# Patient Record
Sex: Male | Born: 1935 | Race: Black or African American | Hispanic: No | Marital: Single | Smoking: Never smoker
Health system: Southern US, Community
[De-identification: ages and names within clinical notes are randomized; demographics above are authoritative.]

## PROBLEM LIST (undated history)

## (undated) DIAGNOSIS — E119 Type 2 diabetes mellitus without complications: Secondary | ICD-10-CM

## (undated) DIAGNOSIS — E785 Hyperlipidemia, unspecified: Secondary | ICD-10-CM

## (undated) DIAGNOSIS — I639 Cerebral infarction, unspecified: Secondary | ICD-10-CM

## (undated) DIAGNOSIS — M4802 Spinal stenosis, cervical region: Secondary | ICD-10-CM

## (undated) DIAGNOSIS — I509 Heart failure, unspecified: Secondary | ICD-10-CM

## (undated) DIAGNOSIS — I219 Acute myocardial infarction, unspecified: Secondary | ICD-10-CM

## (undated) DIAGNOSIS — I2510 Atherosclerotic heart disease of native coronary artery without angina pectoris: Secondary | ICD-10-CM

## (undated) DIAGNOSIS — I10 Essential (primary) hypertension: Secondary | ICD-10-CM

## (undated) HISTORY — PX: CORONARY ANGIOPLASTY: SHX604

## (undated) HISTORY — PX: TOE AMPUTATION: SHX809

---

## 2007-07-18 ENCOUNTER — Other Ambulatory Visit: Payer: Self-pay

## 2007-07-18 ENCOUNTER — Inpatient Hospital Stay: Payer: Self-pay | Admitting: Internal Medicine

## 2007-07-19 ENCOUNTER — Other Ambulatory Visit: Payer: Self-pay

## 2008-09-30 ENCOUNTER — Ambulatory Visit: Payer: Self-pay | Admitting: Cardiovascular Disease

## 2008-12-28 ENCOUNTER — Inpatient Hospital Stay: Payer: Self-pay | Admitting: Internal Medicine

## 2010-04-27 ENCOUNTER — Inpatient Hospital Stay: Payer: Self-pay | Admitting: Internal Medicine

## 2010-05-03 LAB — PATHOLOGY REPORT

## 2010-08-02 ENCOUNTER — Inpatient Hospital Stay: Payer: Self-pay | Admitting: Internal Medicine

## 2013-08-10 ENCOUNTER — Inpatient Hospital Stay: Payer: Self-pay | Admitting: Internal Medicine

## 2013-08-10 LAB — CBC
HCT: 37.2 % — ABNORMAL LOW (ref 40.0–52.0)
HGB: 12.5 g/dL — ABNORMAL LOW (ref 13.0–18.0)
MCH: 30.5 pg (ref 26.0–34.0)
MCHC: 33.5 g/dL (ref 32.0–36.0)
MCV: 91 fL (ref 80–100)
Platelet: 182 10*3/uL (ref 150–440)
RBC: 4.08 10*6/uL — AB (ref 4.40–5.90)
RDW: 14.2 % (ref 11.5–14.5)
WBC: 7.9 10*3/uL (ref 3.8–10.6)

## 2013-08-10 LAB — COMPREHENSIVE METABOLIC PANEL
ALK PHOS: 67 U/L
AST: 16 U/L (ref 15–37)
Albumin: 4 g/dL (ref 3.4–5.0)
Anion Gap: 5 — ABNORMAL LOW (ref 7–16)
BILIRUBIN TOTAL: 0.4 mg/dL (ref 0.2–1.0)
BUN: 13 mg/dL (ref 7–18)
CALCIUM: 9.5 mg/dL (ref 8.5–10.1)
CO2: 28 mmol/L (ref 21–32)
Chloride: 104 mmol/L (ref 98–107)
Creatinine: 1.04 mg/dL (ref 0.60–1.30)
Glucose: 121 mg/dL — ABNORMAL HIGH (ref 65–99)
Osmolality: 275 (ref 275–301)
Potassium: 4.4 mmol/L (ref 3.5–5.1)
SGPT (ALT): 26 U/L (ref 12–78)
Sodium: 137 mmol/L (ref 136–145)
TOTAL PROTEIN: 8 g/dL (ref 6.4–8.2)

## 2013-08-10 LAB — PRO B NATRIURETIC PEPTIDE: B-TYPE NATIURETIC PEPTID: 102 pg/mL (ref 0–450)

## 2013-08-10 LAB — TROPONIN I

## 2013-08-11 ENCOUNTER — Ambulatory Visit: Payer: Self-pay | Admitting: Neurology

## 2013-08-11 LAB — CBC WITH DIFFERENTIAL/PLATELET
Basophil #: 0 10*3/uL (ref 0.0–0.1)
Basophil %: 0.6 %
Eosinophil #: 0.5 10*3/uL (ref 0.0–0.7)
Eosinophil %: 9.3 %
HCT: 33.6 % — ABNORMAL LOW (ref 40.0–52.0)
HGB: 10.9 g/dL — ABNORMAL LOW (ref 13.0–18.0)
LYMPHS PCT: 17.3 %
Lymphocyte #: 0.9 10*3/uL — ABNORMAL LOW (ref 1.0–3.6)
MCH: 29.6 pg (ref 26.0–34.0)
MCHC: 32.6 g/dL (ref 32.0–36.0)
MCV: 91 fL (ref 80–100)
MONO ABS: 0.7 x10 3/mm (ref 0.2–1.0)
MONOS PCT: 13.7 %
NEUTROS ABS: 3.2 10*3/uL (ref 1.4–6.5)
Neutrophil %: 59.1 %
Platelet: 173 10*3/uL (ref 150–440)
RBC: 3.7 10*6/uL — ABNORMAL LOW (ref 4.40–5.90)
RDW: 14.4 % (ref 11.5–14.5)
WBC: 5.5 10*3/uL (ref 3.8–10.6)

## 2013-08-11 LAB — BASIC METABOLIC PANEL
ANION GAP: 4 — AB (ref 7–16)
BUN: 12 mg/dL (ref 7–18)
CO2: 29 mmol/L (ref 21–32)
Calcium, Total: 8.8 mg/dL (ref 8.5–10.1)
Chloride: 105 mmol/L (ref 98–107)
Creatinine: 1.04 mg/dL (ref 0.60–1.30)
EGFR (African American): >60
GLUCOSE: 127 mg/dL — AB (ref 65–99)
Osmolality: 277 (ref 275–301)
Potassium: 4.3 mmol/L (ref 3.5–5.1)
SODIUM: 138 mmol/L (ref 136–145)

## 2014-04-14 ENCOUNTER — Emergency Department: Payer: Self-pay | Admitting: Emergency Medicine

## 2014-04-14 LAB — CBC WITH DIFFERENTIAL/PLATELET
BASOS ABS: 0.1 10*3/uL (ref 0.0–0.1)
Basophil %: 1.1 %
EOS ABS: 0.2 10*3/uL (ref 0.0–0.7)
Eosinophil %: 2.8 %
HCT: 37.9 % — ABNORMAL LOW (ref 40.0–52.0)
HGB: 12.3 g/dL — ABNORMAL LOW (ref 13.0–18.0)
Lymphocyte #: 0.9 10*3/uL — ABNORMAL LOW (ref 1.0–3.6)
Lymphocyte %: 15.2 %
MCH: 29.8 pg (ref 26.0–34.0)
MCHC: 32.6 g/dL (ref 32.0–36.0)
MCV: 92 fL (ref 80–100)
MONO ABS: 0.3 x10 3/mm (ref 0.2–1.0)
MONOS PCT: 5 %
NEUTROS PCT: 75.9 %
Neutrophil #: 4.5 10*3/uL (ref 1.4–6.5)
Platelet: 216 10*3/uL (ref 150–440)
RBC: 4.14 10*6/uL — AB (ref 4.40–5.90)
RDW: 14.7 % — ABNORMAL HIGH (ref 11.5–14.5)
WBC: 6 10*3/uL (ref 3.8–10.6)

## 2014-04-14 LAB — COMPREHENSIVE METABOLIC PANEL
Albumin: 3.9 g/dL (ref 3.4–5.0)
Alkaline Phosphatase: 63 U/L
Anion Gap: 10 (ref 7–16)
BUN: 18 mg/dL (ref 7–18)
Bilirubin,Total: 0.3 mg/dL (ref 0.2–1.0)
Calcium, Total: 9.1 mg/dL (ref 8.5–10.1)
Chloride: 105 mmol/L (ref 98–107)
Co2: 23 mmol/L (ref 21–32)
Creatinine: 1.11 mg/dL (ref 0.60–1.30)
EGFR (African American): >60
Glucose: 100 mg/dL — ABNORMAL HIGH (ref 65–99)
Osmolality: 278 (ref 275–301)
Potassium: 4.5 mmol/L (ref 3.5–5.1)
SGOT(AST): 27 U/L (ref 15–37)
SGPT (ALT): 22 U/L
Sodium: 138 mmol/L (ref 136–145)
TOTAL PROTEIN: 7.9 g/dL (ref 6.4–8.2)

## 2014-09-27 NOTE — Consult Note (Signed)
PATIENT NAME:  Philip Garrett, Philip Garrett MR#:  [id number] DATE OF BIRTH:  1931/11/22  DATE OF CONSULTATION:  08/10/2013  REFERRING PHYSICIAN:   CONSULTING PHYSICIAN:  Leotis Pain, MD  REASON FOR CONSULTATION:  Rule out stroke.   HISTORY OF PRESENTING ILLNESS:  This is a 81 year old gentleman who resides in [facility] home, past medical history of coronary artery disease, history of MI in the past, presenting with shortness of breath at which point he noted that he had difficulty expressing getting his words out, that was short-lived, significantly improved on presentation to the Emergency Department and currently the patient thinks he is back to his baseline.  No visual changes.  No focal weakness on one side of the body compared to the other.  No sensory abnormalities.   PAST MEDICAL HISTORY:  Includes ASCVD, status post MI, status post PTCA with stent placement, history of acute systolic congestive heart failure, type 2 diabetes, benign hypertension, hyperlipidemia, allergies and allergic rhinitis.   HOME MEDICATIONS:  Includes Zocor, Plavix, metformin, Flonase, Levemir, Norvasc, aspirin, Singulair, Tradjenta, Ramipril and Coreg.   SOCIAL HISTORY:  Denies any ETOH use or tobacco use.  No drug use.   FAMILY HISTORY:  Positive for a history of strokes and diabetes in close family members.    REVIEW OF SYSTEMS:  No fever.  No chills.  No double vision.  No blurry vision.  No shortness of breath at this point, but was present on admission.  No cough.  No hemoptysis.  No abdominal pain.  No diarrhea or constipation.  No weakness on one side of the body compared to the other.  No heat or cold intolerance and no anxiety or depression.   LABORATORY DATA:  The patient's laboratory work-up was reviewed.  Ultrasound of the carotid less than 50% hemodynamic stenosis bilaterally so no hemodynamic stenosis.  MRI of the brain, no acute intracranial pathology and mild chronic vascular disease.  CAT scan of the head  was done, also did not show any acute intracranial pathology.  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    PHYSICAL EXAMINATION:  VITAL SIGNS:  The patient's temperature is 97.7, pulse 76, respirations of 20, blood pressure 110/64.  GENERAL:  The patient is alert, awake, oriented to time, place, location, the reason why he is in the hospital.  Speech appears to be fluent.  CRANIAL NERVE EXAMINATION:  Extraocular movements are intact.  Visual fields appear to be intact.  Facial sensation intact.  Facial motor is intact.  Tongue is midline.  Uvula elevates symmetrically.  Shoulder shrug intact.  Motor strength 5 out of 5 bilateral upper and lower extremities.  Sensation to light touch and temperature intact.  COORDINATION:  Finger-to-nose intact.  Reflexes 1+ symmetrical.  Gait not assessed.   IMPRESSION:  A 81 year old gentleman with a past medical history of coronary artery disease, history of myocardial infarction, hyperlipidemia, hypertension and diabetes presenting with what appeared to be expressive aphasia back to baseline.  Imaging is negative.   PLAN:  We will continue the patient on dual antiplatelet therapy with aspirin and Plavix as he was at home probably for stenting.  Would finish a stroke work-up such as carotid Doppler, MRI and CAT scan reviewed above.  No acute intracranial pathology.  Old caudate stroke and the patient does not have any hemodynamic stenosis on his carotids.  Discharge planning.   Thank you.  It was a pleasure seeing this patient.    ____________________________ Leotis Pain, MD yz:ea D: 08/11/2013  13:31:29 ET T: 08/11/2013 16:34:27 ET JOB#: [id number]  cc: Leotis Pain, MD, <Dictator> Leotis Pain MD ELECTRONICALLY SIGNED 08/24/2013 13:33

## 2014-09-27 NOTE — Discharge Summary (Signed)
PATIENT NAME:  Philip Garrett, Philip Garrett MR#:  [id number] DATE OF BIRTH:  [birth year]  DATE OF ADMISSION:  08/10/2013 DATE OF DISCHARGE:  08/12/2013  ADMITTING PHYSICIAN: Leonie Douglas. Doy Hutching, MD  DISCHARGING PHYSICIAN: Venetia Maxon. Elijio Miles, MD  CONSULTATIONS: Neurology, Dr. Leotis Pain.  IMAGING: MRI of the brain, 2-D echocardiogram, carotid ultrasound. MRI negative for acute stroke. Echo negative for thrombus. Carotid ultrasound did not reveal obstructive disease.   DISCHARGE DIAGNOSES: Transient ischemic attack, hypertension, type 2 diabetes mellitus.   HOSPITAL COURSE: This gentleman was admitted through the Emergency Room where he presented with transient expressive aphasia that resolved in less than 24 hours. He did not have any focal weakness. He was evaluated by neurology, who attributed his symptoms to a transient ischemic attack. The patient's hospital stay was otherwise uncomplicated. His speech impairment improved and resolved significantly. The patient was discharged back to his group home in a satisfactory condition.   DISCHARGE MEDICATIONS: Please refer to medical reconciliation.   DISCHARGE INSTRUCTIONS:   DIET: Low-sodium, low-fat, low-cholesterol ADA diet.   ACTIVITY LIMITATIONS: None.   FOLLOWUP: In 1 to 2 weeks, follow up with Dr. Elijio Miles.   TIME SPENT: For evaluation, management, and discharge: 35 minutes.   ____________________________ Venetia Maxon Elijio Miles, MD sat:jcm D: 08/23/2013 13:33:54 ET T: 08/23/2013 15:10:00 ET JOB#: [id number]  cc: Sheikh A. Elijio Miles, MD, <Dictator> Veverly Fells MD ELECTRONICALLY SIGNED 08/27/2013 13:38

## 2014-09-27 NOTE — H&P (Signed)
PATIENT NAME:  Philip Garrett, BACALLAO MR#:  [id number] DATE OF BIRTH:  [birth year]  DATE OF ADMISSION:  08/10/2013  REFERRING PHYSICIAN: Dr. Corky Downs.   FAMILY PHYSICIAN: Dr. Elijio Miles.   REASON FOR ADMISSION: Expressive aphasia.   HISTORY OF PRESENT ILLNESS: The patient is a 81 year old male, who resides at [facility] care home. The patient has a history of coronary artery disease, status post MI with PTCA and stent placement. Also has a history of hyperlipidemia, benign hypertension and diabetes. Developed some apparent shortness of breath and respiratory difficulty earlier this morning, then developed aphasia and could not speak. Presented to the Emergency Room where he was noted to have an expressive aphasia, which is somewhat improved from his initial presentation. He does complain of some difficulty with his throat and breathing. Workup in the Emergency Room was essentially unremarkable. He is now admitted for further evaluation.   PAST MEDICAL HISTORY:  1.  ASCVD, status post MI.  2.  Status post PTCA with stent placement.  3.  History of acute systolic congestive heart failure.  4.  Type 2 diabetes.  5.  Benign hypertension.  6.  Hyperlipidemia.  7.  Environmental allergies.  8.  Allergic rhinitis.   MEDICATIONS:  1.  Zocor 40 mg p.o. daily.  2.  Plavix 75 mg p.o. daily.  3.  Metformin 1000 mg p.o. b.i.d.  4.  Flonase 2 puffs in each nostril daily.  5.  Levemir 48 units subcutaneous q.a.m. and 30 units subcutaneous every p.m.  6.  Norvasc 5 mg p.o. daily. 7.  Aspirin 81 mg p.o. daily.  8.  Singulair 10 mg p.o. daily.  9.  Tradjenta 5 mg p.o. daily.  10. Ramipril 10 mg p.o. daily.  11. Coreg 50 mg p.o. b.i.d.   ALLERGIES: No known drug allergies.   SOCIAL HISTORY: The patient denies any history of alcohol or tobacco abuse.   FAMILY HISTORY: Positive for stroke and diabetes, but otherwise unremarkable.   REVIEW OF SYSTEMS: CONSTITUTIONAL: No fever or change in weight. EYES: No blurred  or double vision. No glaucoma. ENT: No tinnitus or hearing loss. No nasal discharge or bleeding. Some difficulty swallowing. RESPIRATORY: No cough or wheezing. Denies hemoptysis. CARDIOVASCULAR: No chest pain or palpitations. No syncope. GASTROINTESTINAL: No nausea, vomiting, or diarrhea. No abdominal pain. GENITOURINARY: No dysuria or hematuria. No incontinence. ENDOCRINE: No polyuria or polydipsia. No heat or cold intolerance. HEMATOLOGIC: The patient denies anemia, easy bruising, or bleeding. LYMPHATIC: No swollen glands. MUSCULOSKELETAL: The patient denies pain in his neck, back, shoulders, knees, or hips. No gout. NEUROLOGIC: No numbness or migraines. Denies seizures. PSYCHIATRIC: The patient denies anxiety, insomnia or depression.   PHYSICAL EXAMINATION:  GENERAL: The patient is in no acute distress.  VITAL SIGNS: Remarkable for a blood pressure of 152/79, heart rate of 83, respiratory rate of 20, temperature of 98.2, satting 99% on room air.  HEENT: Normocephalic, atraumatic. Pupils equally round and reactive to light and accommodation. Extraocular movements are intact. Sclerae are anicteric. Conjunctivae are clear.  Oropharynx is clear.  NECK: Supple without JVD. No adenopathy or thyromegaly is noted.  LUNGS: Basilar rhonchi without wheezes or rales. No dullness. Respiratory effort is normal.  CARDIAC: Regular rate and rhythm with normal S1, S2. No significant rubs, murmurs or gallops. PMI is nondisplaced. Chest wall is nontender.  ABDOMEN: Soft, nontender, with normoactive bowel sounds. No organomegaly or masses were appreciated. No hernias or bruits were noted.  EXTREMITIES: Without clubbing, cyanosis or edema. Pulses were 2+ bilaterally.  SKIN: Warm and dry without rash or lesions.  NEUROLOGIC: Cranial nerves II through XII grossly intact. Deep tendon reflexes were symmetric. Motor and sensory examination is nonfocal. An expressive aphasia was noted.  PSYCHIATRIC: Revealed a patient who was  alert and oriented to person, place, and time. He was cooperative.   LABORATORY DATA: EKG revealed sinus rhythm at 76 beats per minute with no acute ischemic changes. An old inferior MI was noted. Chest x-ray revealed some basilar atelectasis at the left, but was otherwise unremarkable. Head CT showed an old lacunar infarct with no acute intracranial abnormality. White count was 7.9 with a hemoglobin of 12.5. Glucose was 121, with a BUN of 13, creatinine 1.04 and a GFR of greater than 60. Sodium 137, with potassium of 4.4. Troponin was less than 0.02. BNP was 102.   ASSESSMENT:  1.  Expressive aphasia worrisome for stroke.  2.  Chronic obstructive pulmonary disease/asthma.  3.  Benign hypertension.  4.  Type 2 diabetes.  5.  Hyperlipidemia.  6.  Atherosclerotic coronary vascular disease, status post previous myocardial infarction.   PLAN: The patient will be admitted to telemetry on aspirin and Plavix. We will obtain carotid Dopplers and an MRI of the brain. We will get consults from speech therapy and physical therapy. We will follow her sugars with Accu-Cheks before meals and at bedtime and add sliding scale insulin as needed. We will optimize his blood pressure regimen. Will begin DuoNeb for his shortness of breath. Supplement oxygen as needed. Follow up routine labs in the morning. Further treatment and evaluation will depend upon the patient's progress.   TOTAL TIME SPENT ON THIS PATIENT: 50 minutes.    ____________________________ Leonie Douglas Doy Hutching, MD jds:aw D: 08/10/2013 13:43:29 ET T: 08/10/2013 14:00:07 ET JOB#: [id number]  cc: Leonie Douglas. Doy Hutching, MD, <Dictator> Sheikh A. Elijio Miles, MD JEFFREY Lennice Sites MD ELECTRONICALLY SIGNED 08/10/2013 14:37

## 2015-05-07 ENCOUNTER — Emergency Department
Admission: EM | Admit: 2015-05-07 | Discharge: 2015-05-07 | Disposition: A | Discharge status: Home / Self Care | Payer: Medicare Other | Attending: Emergency Medicine | Admitting: Emergency Medicine

## 2015-05-07 ENCOUNTER — Emergency Department: Payer: Medicare Other

## 2015-05-07 DIAGNOSIS — Y9389 Activity, other specified: Secondary | ICD-10-CM | POA: Insufficient documentation

## 2015-05-07 DIAGNOSIS — Z794 Long term (current) use of insulin: Secondary | ICD-10-CM | POA: Diagnosis not present

## 2015-05-07 DIAGNOSIS — R2681 Unsteadiness on feet: Secondary | ICD-10-CM | POA: Insufficient documentation

## 2015-05-07 DIAGNOSIS — Y998 Other external cause status: Secondary | ICD-10-CM | POA: Diagnosis not present

## 2015-05-07 DIAGNOSIS — Y92002 Bathroom of unspecified non-institutional (private) residence single-family (private) house as the place of occurrence of the external cause: Secondary | ICD-10-CM | POA: Diagnosis not present

## 2015-05-07 DIAGNOSIS — Z79899 Other long term (current) drug therapy: Secondary | ICD-10-CM | POA: Insufficient documentation

## 2015-05-07 DIAGNOSIS — S80211A Abrasion, right knee, initial encounter: Secondary | ICD-10-CM | POA: Insufficient documentation

## 2015-05-07 DIAGNOSIS — I10 Essential (primary) hypertension: Secondary | ICD-10-CM | POA: Diagnosis not present

## 2015-05-07 DIAGNOSIS — E11649 Type 2 diabetes mellitus with hypoglycemia without coma: Secondary | ICD-10-CM | POA: Diagnosis not present

## 2015-05-07 DIAGNOSIS — W1839XA Other fall on same level, initial encounter: Secondary | ICD-10-CM | POA: Diagnosis not present

## 2015-05-07 DIAGNOSIS — E162 Hypoglycemia, unspecified: Secondary | ICD-10-CM

## 2015-05-07 HISTORY — DX: Type 2 diabetes mellitus without complications: E11.9

## 2015-05-07 HISTORY — DX: Essential (primary) hypertension: I10

## 2015-05-07 HISTORY — DX: Heart failure, unspecified: I50.9

## 2015-05-07 LAB — URINALYSIS COMPLETE WITH MICROSCOPIC (ARMC ONLY)
BACTERIA UA: NONE SEEN
Bilirubin Urine: NEGATIVE
GLUCOSE, UA: 50 mg/dL — AB
HGB URINE DIPSTICK: NEGATIVE
Ketones, ur: NEGATIVE mg/dL
NITRITE: NEGATIVE
PH: 5 (ref 5.0–8.0)
Protein, ur: NEGATIVE mg/dL
SPECIFIC GRAVITY, URINE: 1.024 (ref 1.005–1.030)

## 2015-05-07 LAB — GLUCOSE, CAPILLARY
GLUCOSE-CAPILLARY: 223 mg/dL — AB (ref 65–99)
GLUCOSE-CAPILLARY: 106 mg/dL — AB (ref 65–99)
GLUCOSE-CAPILLARY: 101 mg/dL — AB (ref 65–99)
Glucose-Capillary: 125 mg/dL — ABNORMAL HIGH (ref 65–99)
Glucose-Capillary: 144 mg/dL — ABNORMAL HIGH (ref 65–99)
Glucose-Capillary: 111 mg/dL — ABNORMAL HIGH (ref 65–99)

## 2015-05-07 LAB — TROPONIN I: Troponin I: <0.03 ng/mL (ref <0.031)

## 2015-05-07 LAB — COMPREHENSIVE METABOLIC PANEL
ALBUMIN: 3.9 g/dL (ref 3.5–5.0)
ALK PHOS: 56 U/L (ref 38–126)
ALT: 14 U/L — AB (ref 17–63)
AST: 24 U/L (ref 15–41)
Anion gap: 7 (ref 5–15)
BILIRUBIN TOTAL: 0.5 mg/dL (ref 0.3–1.2)
BUN: 19 mg/dL (ref 6–20)
CALCIUM: 9.4 mg/dL (ref 8.9–10.3)
CO2: 23 mmol/L (ref 22–32)
CREATININE: 0.9 mg/dL (ref 0.61–1.24)
Chloride: 103 mmol/L (ref 101–111)
GFR calc Af Amer: >60 mL/min (ref >60)
GLUCOSE: 173 mg/dL — AB (ref 65–99)
POTASSIUM: 4.8 mmol/L (ref 3.5–5.1)
Sodium: 133 mmol/L — ABNORMAL LOW (ref 135–145)
TOTAL PROTEIN: 7.8 g/dL (ref 6.5–8.1)

## 2015-05-07 LAB — CBC WITH DIFFERENTIAL/PLATELET
BASOS ABS: 0 10*3/uL (ref 0–0.1)
Basophils Relative: 0 %
Eosinophils Absolute: 0.1 10*3/uL (ref 0–0.7)
Eosinophils Relative: 2 %
HEMATOCRIT: 34.6 % — AB (ref 40.0–52.0)
Hemoglobin: 11.5 g/dL — ABNORMAL LOW (ref 13.0–18.0)
LYMPHS ABS: 0.8 10*3/uL — AB (ref 1.0–3.6)
LYMPHS PCT: 17 %
MCH: 28.2 pg (ref 26.0–34.0)
MCHC: 33.3 g/dL (ref 32.0–36.0)
MCV: 84.8 fL (ref 80.0–100.0)
Monocytes Absolute: 0.3 10*3/uL (ref 0.2–1.0)
Monocytes Relative: 6 %
NEUTROS ABS: 3.8 10*3/uL (ref 1.4–6.5)
Neutrophils Relative %: 75 %
Platelets: 210 10*3/uL (ref 150–440)
RBC: 4.08 MIL/uL — ABNORMAL LOW (ref 4.40–5.90)
RDW: 16.2 % — ABNORMAL HIGH (ref 11.5–14.5)
WBC: 5.1 10*3/uL (ref 3.8–10.6)

## 2015-05-07 LAB — CK: Total CK: 169 U/L (ref 49–397)

## 2015-05-07 MED ORDER — RAMIPRIL 10 MG PO CAPS
10.0000 mg | ORAL_CAPSULE | Freq: Every day | ORAL | Status: DC
  Administered 2015-05-07: 10 mg via ORAL
  Filled 2015-05-07: qty 1

## 2015-05-07 MED ORDER — CARVEDILOL 25 MG PO TABS
ORAL_TABLET | ORAL | Status: AC
  Administered 2015-05-07: 25 mg via ORAL
  Filled 2015-05-07: qty 1

## 2015-05-07 MED ORDER — CARVEDILOL 25 MG PO TABS
25.0000 mg | ORAL_TABLET | Freq: Two times a day (BID) | ORAL | Status: DC
  Administered 2015-05-07: 25 mg via ORAL

## 2015-05-07 MED ORDER — SODIUM CHLORIDE 0.9 % IV BOLUS (SEPSIS)
1000.0000 mL | Freq: Once | INTRAVENOUS | Status: AC
  Administered 2015-05-07: 1000 mL via INTRAVENOUS

## 2015-05-07 NOTE — ED Provider Notes (Signed)
CSN: [id number]     Arrival date & time 05/07/15  [id number] History   First MD Initiated Contact with Patient 05/07/15 [phone]     Chief Complaint  Patient presents with  . Fall  . Hypoglycemia     (Consider location/radiation/quality/duration/timing/severity/associated sxs/prior Treatment) The history is provided by the patient.  Philip Garrett is a 80 y.o. male hx of CAD, DM, here presenting with fall, hypoglycemia. Patient is from an assisted living. He was given insulin at 8 PM as usual. This morning, the assisted-living found him on the bathroom floor. His blood sugar that time was 30. He did not remember how he ended up on the bathroom floor. He is not on blood thinners. Denies headaches. He was given glucagon IM and 1 amp of D50 and blood sugar came up to 220. Denies fever or chills. Denies vomiting. States that he has been eating well but didn't eat this morning.    Past Medical History  Diagnosis Date  . Diabetes mellitus without complication ([place])   . Hypertension   . CHF (congestive heart failure) ([place])    History reviewed. No pertinent past surgical history. History reviewed. No pertinent family history. Social History  Substance Use Topics  . Smoking status: Never Smoker   . Smokeless tobacco: None  . Alcohol Use: No    Review of Systems  Unable to perform ROS: Mental status change  All other systems reviewed and are negative.     Allergies  Review of patient's allergies indicates no known allergies.  Home Medications   Prior to Admission medications   Medication Sig Start Date End Date Taking? Authorizing Provider  acetaminophen (TYLENOL) 500 MG tablet Take 500 mg by mouth every 6 (six) hours as needed.   Yes Historical Provider, MD  amLODipine (NORVASC) 5 MG tablet Take 5 mg by mouth daily.   Yes Historical Provider, MD  aspirin EC 81 MG tablet Take 81 mg by mouth daily.   Yes Historical Provider, MD  atorvastatin (LIPITOR) 20 MG tablet Take 20 mg by mouth daily.    Yes Historical Provider, MD  carvedilol (COREG) 25 MG tablet Take 50 mg by mouth 2 (two) times daily with a meal.   Yes Historical Provider, MD  clopidogrel (PLAVIX) 75 MG tablet Take 75 mg by mouth daily.   Yes Historical Provider, MD  fluticasone (FLONASE) 50 MCG/ACT nasal spray Place 1 spray into both nostrils daily.   Yes Historical Provider, MD  insulin detemir (LEVEMIR) 100 UNIT/ML injection Inject 38 Units into the skin 2 (two) times daily. Every morning and at bedtime   Yes Historical Provider, MD  linagliptin (TRADJENTA) 5 MG TABS tablet Take 5 mg by mouth daily.   Yes Historical Provider, MD  loratadine (CLARITIN) 10 MG tablet Take 10 mg by mouth daily.   Yes Historical Provider, MD  metFORMIN (GLUCOPHAGE) 1000 MG tablet Take 1,000 mg by mouth 2 (two) times daily with a meal.   Yes Historical Provider, MD  montelukast (SINGULAIR) 10 MG tablet Take 10 mg by mouth daily.   Yes Historical Provider, MD  ramipril (ALTACE) 10 MG capsule Take 10 mg by mouth daily.   Yes Historical Provider, MD  Skin Protectants, Misc. (EUCERIN) cream Apply topically as needed for dry skin.   Yes Historical Provider, MD   BP 118/59 mmHg  Pulse 63  Temp(Src) 97.4 F (36.3 C) (Oral)  Resp 13  Ht 6\' 4"  (1.93 m)  Wt 250 lb (113.399 kg)  BMI 30.44  kg/m2  SpO2 99% Physical Exam  Constitutional: He is oriented to person, place, and time.  Chronically ill, NAD   HENT:  Head: Normocephalic and atraumatic.  Mouth/Throat: Oropharynx is clear and moist.  Eyes: Conjunctivae are normal. Pupils are equal, round, and reactive to light.  Neck: Normal range of motion. Neck supple.  No midline tenderness   Cardiovascular: Normal rate, regular rhythm and normal heart sounds.   Pulmonary/Chest: Effort normal and breath sounds normal. No respiratory distress. He has no wheezes. He has no rales.  Abdominal: Soft. Bowel sounds are normal. He exhibits no distension. There is no tenderness. There is no rebound.   Musculoskeletal: Normal range of motion. He exhibits no edema or tenderness.  Pelvis stable. Abrasion R knee, nontender, nl ROM   Neurological: He is alert and oriented to person, place, and time. No cranial nerve deficit. Coordination normal.  Skin: Skin is warm and dry.  Psychiatric: He has a normal mood and affect. His behavior is normal. Judgment and thought content normal.  Nursing note and vitals reviewed.   ED Course  Procedures (including critical care time) Labs Review Labs Reviewed  CBC WITH DIFFERENTIAL/PLATELET - Abnormal; Notable for the following:    RBC 4.08 (*)    Hemoglobin 11.5 (*)    HCT 34.6 (*)    RDW 16.2 (*)    Lymphs Abs 0.8 (*)    All other components within normal limits  COMPREHENSIVE METABOLIC PANEL - Abnormal; Notable for the following:    Sodium 133 (*)    Glucose, Bld 173 (*)    ALT 14 (*)    All other components within normal limits  GLUCOSE, CAPILLARY - Abnormal; Notable for the following:    Glucose-Capillary 125 (*)    All other components within normal limits  GLUCOSE, CAPILLARY - Abnormal; Notable for the following:    Glucose-Capillary 223 (*)    All other components within normal limits  GLUCOSE, CAPILLARY - Abnormal; Notable for the following:    Glucose-Capillary 144 (*)    All other components within normal limits  URINALYSIS COMPLETEWITH MICROSCOPIC ([facility] ONLY) - Abnormal; Notable for the following:    Color, Urine YELLOW (*)    APPearance CLEAR (*)    Glucose, UA 50 (*)    Leukocytes, UA TRACE (*)    Squamous Epithelial / LPF 0-5 (*)    All other components within normal limits  GLUCOSE, CAPILLARY - Abnormal; Notable for the following:    Glucose-Capillary 106 (*)    All other components within normal limits  GLUCOSE, CAPILLARY - Abnormal; Notable for the following:    Glucose-Capillary 101 (*)    All other components within normal limits  TROPONIN I  CK  CBG MONITORING, ED  CBG MONITORING, ED  CBG MONITORING, ED  CBG  MONITORING, ED    Imaging Review Dg Chest 2 View  05/07/2015  CLINICAL DATA:  Fall EXAM: CHEST  2 VIEW COMPARISON:  08/10/2013 FINDINGS: The heart size and mediastinal contours are within normal limits. Both lungs are clear. The visualized skeletal structures are unremarkable. IMPRESSION: No active cardiopulmonary disease. Electronically Signed   By: Franchot Gallo M.D.   On: 05/07/2015 12:03   Dg Pelvis 1-2 Views  05/07/2015  CLINICAL DATA:  Diabetic. Chf. Nonsmoker. Found on ground this morning, blacked out, doesn't remember. Unable to give history. Poor historian, possible tightness in chest. EXAM: PELVIS - 1-2 VIEW COMPARISON:  None. FINDINGS: There is no evidence of pelvic fracture or diastasis. No  pelvic bone lesions are seen. IMPRESSION: Negative. Electronically Signed   By: Nolon Nations M.D.   On: 05/07/2015 12:04   Ct Head Wo Contrast  05/07/2015  CLINICAL DATA:  Fall EXAM: CT HEAD WITHOUT CONTRAST CT CERVICAL SPINE WITHOUT CONTRAST TECHNIQUE: Multidetector CT imaging of the head and cervical spine was performed following the standard protocol without intravenous contrast. Multiplanar CT image reconstructions of the cervical spine were also generated. COMPARISON:  CT head 04/14/2014 FINDINGS: CT HEAD FINDINGS Generalized atrophy. Negative for hydrocephalus. Chronic infarct in the right caudate unchanged. Mild chronic microvascular ischemic change in the white matter Negative for acute infarct. Negative for hemorrhage or mass. Prominent cisterna magna unchanged. Negative for skull fracture.  Atherosclerotic calcification. CT CERVICAL SPINE FINDINGS Negative for fracture. 2 mm anterior slip C4-5 due to disc and facet degeneration. Diffuse facet degeneration in the cervical spine. Disc degeneration and mild spurring at C5-6. Degenerative change at C1-2. Coronary artery calcification.  No soft tissue mass in the neck. IMPRESSION: No acute intracranial abnormality Negative for cervical spine  fracture. Electronically Signed   By: Franchot Gallo M.D.   On: 05/07/2015 11:04   Ct Cervical Spine Wo Contrast  05/07/2015  CLINICAL DATA:  Fall EXAM: CT HEAD WITHOUT CONTRAST CT CERVICAL SPINE WITHOUT CONTRAST TECHNIQUE: Multidetector CT imaging of the head and cervical spine was performed following the standard protocol without intravenous contrast. Multiplanar CT image reconstructions of the cervical spine were also generated. COMPARISON:  CT head 04/14/2014 FINDINGS: CT HEAD FINDINGS Generalized atrophy. Negative for hydrocephalus. Chronic infarct in the right caudate unchanged. Mild chronic microvascular ischemic change in the white matter Negative for acute infarct. Negative for hemorrhage or mass. Prominent cisterna magna unchanged. Negative for skull fracture.  Atherosclerotic calcification. CT CERVICAL SPINE FINDINGS Negative for fracture. 2 mm anterior slip C4-5 due to disc and facet degeneration. Diffuse facet degeneration in the cervical spine. Disc degeneration and mild spurring at C5-6. Degenerative change at C1-2. Coronary artery calcification.  No soft tissue mass in the neck. IMPRESSION: No acute intracranial abnormality Negative for cervical spine fracture. Electronically Signed   By: Franchot Gallo M.D.   On: 05/07/2015 11:04   Mr Brain Wo Contrast  05/07/2015  CLINICAL DATA:  Weakness.  Rule out stroke. EXAM: MRI HEAD WITHOUT CONTRAST TECHNIQUE: Multiplanar, multiecho pulse sequences of the brain and surrounding structures were obtained without intravenous contrast. COMPARISON:  08/10/2013 FINDINGS: Calvarium and upper cervical spine: No focal marrow signal abnormality. Orbits: No significant findings. Sinuses and Mastoids: Clear. Brain: No acute abnormality such as acute infarct, hemorrhage, hydrocephalus, or mass lesion. No evidence of large vessel occlusion. Generalized volume loss and chronic small vessel ischemic gliosis in the deep cerebral white matter, typical for age. There is a  chronic lacunar infarct in the right caudate head. Developmental retro cerebellar CSF accumulation, incidental. IMPRESSION: 1. No acute finding or change from 2015. 2. Senescent changes typical for age. Electronically Signed   By: Monte Fantasia M.D.   On: 05/07/2015 12:45   I have personally reviewed and evaluated these images and lab results as part of my medical decision-making.   EKG Interpretation None       ED ECG REPORT I, Kimla Furth, the attending physician, personally viewed and interpreted this ECG.   Date: 05/07/2015  EKG Time: 7:27 AM  Rate: 63  Rhythm: normal EKG, normal sinus rhythm  Axis: normal  Intervals:none  ST&T Change: lateral Q waves, baseline tremors, difficult to interpret    MDM  Final diagnoses:  None   Philip Garrett is a 81 y.o. male here with hypoglycemia. Likely from lantus use. Was on the floor overnight so will check CK level. No signs of head/neck trauma. Will get labs and recheck CBG.   1:20 PM Patient ate crackers and drank juice. CBG stable for 3 hrs. Unsteady with ambulation so will get CT head/neck. Labs at baseline.  1:20 PM CT head/neck unremarkable. MRI brain showed no stroke. Likely baseline unsteadiness. Patient in assisted living. CBG stable for 6 hrs. Will dc back.  Wandra Arthurs, MD 05/07/15 [phone]

## 2015-05-07 NOTE — ED Notes (Signed)
Pt eating graham crackers and drinking orange juice. 

## 2015-05-07 NOTE — ED Notes (Signed)
Pt ambulated down hall, pt unsteady on his feet and states he feels like he is walking different, pt required 2 people to steadily ambulate

## 2015-05-07 NOTE — ED Notes (Signed)
Pt arrives from [place]  He was found on the floor in the BR this am and was down for an unknown amount of time   Pt found cold and sweaty  FSBS was 32  EMS administered an amp of D50  - FSBS 147 prior to arrival   Hx CAD and DM   Last pm FSBS was 234 so his regular dose of Levemir 38units was given 05/06/15  2000   Abrasion to right knee  Pt with no complaints upon arrival

## 2015-05-07 NOTE — Discharge Instructions (Signed)
Check blood sugar in the morning and at night.   Continue your meds as prescribed.   See your doctor.   Keep him hydrated.  Return to ER if you have worse weakness, falls, low blood sugar.

## 2015-07-24 ENCOUNTER — Encounter: Payer: Self-pay | Admitting: Emergency Medicine

## 2015-07-24 ENCOUNTER — Emergency Department: Payer: Medicare Other

## 2015-07-24 ENCOUNTER — Emergency Department
Admission: EM | Admit: 2015-07-24 | Discharge: 2015-07-24 | Disposition: A | Discharge status: Home / Self Care | Payer: Medicare Other | Attending: Emergency Medicine | Admitting: Emergency Medicine

## 2015-07-24 DIAGNOSIS — S82832A Other fracture of upper and lower end of left fibula, initial encounter for closed fracture: Secondary | ICD-10-CM | POA: Insufficient documentation

## 2015-07-24 DIAGNOSIS — Z7902 Long term (current) use of antithrombotics/antiplatelets: Secondary | ICD-10-CM | POA: Insufficient documentation

## 2015-07-24 DIAGNOSIS — Z79899 Other long term (current) drug therapy: Secondary | ICD-10-CM | POA: Diagnosis not present

## 2015-07-24 DIAGNOSIS — Y9289 Other specified places as the place of occurrence of the external cause: Secondary | ICD-10-CM | POA: Diagnosis not present

## 2015-07-24 DIAGNOSIS — E119 Type 2 diabetes mellitus without complications: Secondary | ICD-10-CM | POA: Insufficient documentation

## 2015-07-24 DIAGNOSIS — Z7984 Long term (current) use of oral hypoglycemic drugs: Secondary | ICD-10-CM | POA: Diagnosis not present

## 2015-07-24 DIAGNOSIS — Y9389 Activity, other specified: Secondary | ICD-10-CM | POA: Diagnosis not present

## 2015-07-24 DIAGNOSIS — I10 Essential (primary) hypertension: Secondary | ICD-10-CM | POA: Diagnosis not present

## 2015-07-24 DIAGNOSIS — W1839XA Other fall on same level, initial encounter: Secondary | ICD-10-CM | POA: Insufficient documentation

## 2015-07-24 DIAGNOSIS — S8992XA Unspecified injury of left lower leg, initial encounter: Secondary | ICD-10-CM | POA: Diagnosis present

## 2015-07-24 DIAGNOSIS — Z794 Long term (current) use of insulin: Secondary | ICD-10-CM | POA: Diagnosis not present

## 2015-07-24 DIAGNOSIS — Z7982 Long term (current) use of aspirin: Secondary | ICD-10-CM | POA: Diagnosis not present

## 2015-07-24 DIAGNOSIS — Z7951 Long term (current) use of inhaled steroids: Secondary | ICD-10-CM | POA: Insufficient documentation

## 2015-07-24 DIAGNOSIS — S82402A Unspecified fracture of shaft of left fibula, initial encounter for closed fracture: Secondary | ICD-10-CM

## 2015-07-24 DIAGNOSIS — Y998 Other external cause status: Secondary | ICD-10-CM | POA: Diagnosis not present

## 2015-07-24 MED ORDER — TRAMADOL HCL 50 MG PO TABS: 50.0000 mg | ORAL_TABLET | Freq: Four times a day (QID) | ORAL | Status: DC | PRN

## 2015-07-24 NOTE — ED Notes (Addendum)
Brought over from [place] with pain to left leg/knee. States he had a fall on sat.   Had mobile x-ray and dx'd with fx.Marland Kitchen

## 2015-07-24 NOTE — ED Provider Notes (Signed)
[facility] Emergency Department Provider Note    ____________________________________________  Time seen: ~1520  I have reviewed the triage vital signs and the nursing notes.   HISTORY  Chief Complaint Leg Pain   History limited by: Not Limited   HPI Philip Garrett is a 82 y.o. male who presents to the emergency department today because of concerns for left leg pain and proximal fibula fracture. The patient states he fell one week ago. He fell when he got his foot caught under. States he fell backwards with his left leg, underneath him. He has been able to walk around using his walker since that time. He only complained to staff about leg pain a couple of days after the injury. Mobile x-ray showed a proximal fibula fracture today. He was brought to the emergency department for further evaluation. He denies any head injury.     Past Medical History  Diagnosis Date  . Diabetes mellitus without complication ([place])   . Hypertension   . CHF (congestive heart failure) ([place])     There are no active problems to display for this patient.   History reviewed. No pertinent past surgical history.  Current Outpatient Rx  Name  Route  Sig  Dispense  Refill  . acetaminophen (TYLENOL) 500 MG tablet   Oral   Take 500 mg by mouth every 6 (six) hours as needed.         Marland Kitchen amLODipine (NORVASC) 5 MG tablet   Oral   Take 5 mg by mouth daily.         Marland Kitchen aspirin EC 81 MG tablet   Oral   Take 81 mg by mouth daily.         Marland Kitchen atorvastatin (LIPITOR) 20 MG tablet   Oral   Take 20 mg by mouth daily.         . carvedilol (COREG) 25 MG tablet   Oral   Take 50 mg by mouth 2 (two) times daily with a meal.         . clopidogrel (PLAVIX) 75 MG tablet   Oral   Take 75 mg by mouth daily.         . fluticasone (FLONASE) 50 MCG/ACT nasal spray   Each Nare   Place 1 spray into both nostrils daily.         . insulin detemir (LEVEMIR) 100 UNIT/ML injection  Subcutaneous   Inject 38 Units into the skin 2 (two) times daily. Every morning and at bedtime         . linagliptin (TRADJENTA) 5 MG TABS tablet   Oral   Take 5 mg by mouth daily.         Marland Kitchen loratadine (CLARITIN) 10 MG tablet   Oral   Take 10 mg by mouth daily.         . metFORMIN (GLUCOPHAGE) 1000 MG tablet   Oral   Take 1,000 mg by mouth 2 (two) times daily with a meal.         . montelukast (SINGULAIR) 10 MG tablet   Oral   Take 10 mg by mouth daily.         . ramipril (ALTACE) 10 MG capsule   Oral   Take 10 mg by mouth daily.         . Skin Protectants, Misc. (EUCERIN) cream   Topical   Apply topically as needed for dry skin.  Allergies Review of patient's allergies indicates no known allergies.  No family history on file.  Social History Social History  Substance Use Topics  . Smoking status: Never Smoker   . Smokeless tobacco: None  . Alcohol Use: No    Review of Systems Constitutional: Negative for fever. Cardiovascular: Negative for chest pain. Respiratory: Negative for shortness of breath. Gastrointestinal: Negative for abdominal pain, vomiting and diarrhea. Neurological: Negative for headaches, focal weakness or numbness.   10-point ROS otherwise negative.  ____________________________________________   PHYSICAL EXAM:  VITAL SIGNS: ED Triage Vitals  Enc Vitals Group     BP 07/24/15 1256 108/69 mmHg     Pulse Rate 07/24/15 1256 82     Resp 07/24/15 1256 18     Temp 07/24/15 1256 98.2 F (36.8 C)     Temp Source 07/24/15 1256 Oral     SpO2 07/24/15 1256 100 %     Weight 07/24/15 1256 250 lb (113.399 kg)     Height 07/24/15 1256 6\' 4"  (1.93 m)     Head Cir --      Peak Flow --      Pain Score 07/24/15 1257 4   Constitutional: Alert and oriented. Well appearing and in no distress. Eyes: Conjunctivae are normal. PERRL. Normal extraocular movements. ENT   Head: Normocephalic and atraumatic.   Nose: No  congestion/rhinnorhea.   Mouth/Throat: Mucous membranes are moist.   Neck: No stridor. No midline tenderness. Hematological/Lymphatic/Immunilogical: No cervical lymphadenopathy. Cardiovascular: Normal rate, regular rhythm.  No murmurs, rubs, or gallops. Respiratory: Normal respiratory effort without tachypnea nor retractions. Breath sounds are clear and equal bilaterally. No wheezes/rales/rhonchi. Gastrointestinal: Soft and nontender. No distention. There is no CVA tenderness. Genitourinary: Deferred Musculoskeletal: Mild tenderness to palpation of the proximal distal fibula. No gross deformities. Neurovascularly intact distally. Hips without tenderness to manipulation or palpation. No spinal tenderness. Neurologic:  Normal speech and language. No gross focal neurologic deficits are appreciated.  Skin:  Skin is warm, dry and intact. No rash noted. Psychiatric: Mood and affect are normal. Speech and behavior are normal. Patient exhibits appropriate insight and judgment.  ____________________________________________    LABS (pertinent positives/negatives)  None  ____________________________________________   EKG  None  ____________________________________________    RADIOLOGY  Left tib/fib IMPRESSION: Acute obliquely oriented fractures through the proximal and lateral aspects of the fibula.  No evidence of widening of the distal tibiofibular clear space to suggest syndesmotic injury or instability.  I, Carynn Felling, personally viewed and evaluated these images (plain radiographs) as part of my medical decision making. ____________________________________________   PROCEDURES  Procedure(s) performed: None  Critical Care performed: No  ____________________________________________   INITIAL IMPRESSION / ASSESSMENT AND PLAN / ED COURSE  Pertinent labs & imaging results that were available during my care of the patient were reviewed by me and considered in my  medical decision making (see chart for details).  Patient presented to the emergency department today because of concern for left leg pain and fracture. Tib/fib x-ray here shows both proximal and distal fibula fracture. Discussed with Dr. Sabra Heck who recommended minimal weight bearing and knee immobilizer. Will have patient follow up with orthopedics.   ____________________________________________   FINAL CLINICAL IMPRESSION(S) / ED DIAGNOSES  Final diagnoses:  Fibula fracture, left, closed, initial encounter     Nance Pear, MD 07/24/15 1623

## 2015-07-24 NOTE — Discharge Instructions (Signed)
Please seek medical attention for any high fevers, chest pain, shortness of breath, change in behavior, persistent vomiting, bloody stool or any other new or concerning symptoms. It is okay to put weight on the left leg, but minimal weight and do not cause pain.     Fibular Fracture  The fibula is the smaller of the two lower leg bones and is vulnerable to breaks (fracture). Fibular fractures may go fully through the bone (complete) or partially (incomplete). The bone fragments are rarely out of alignment (displaced fracture). Fibula fractures may occur anywhere along the bone. However, this document only discusses fractures that do not involve a leg joint. Fibular fractures are not often a severe injury because the bone supports only about 17% of the body weight. SYMPTOMS   Moderate to severe pain in the lower leg.  Tenderness and swelling in the leg or calf.  Bleeding and/or bruising (contusion) in the leg.  Inability to bear weight on the injured extremity.  Visible deformity, if the fracture is displaced.  Numbness and coldness in the leg and foot, beyond the fracture site, if blood supply is impaired. CAUSES  Fractures occur when a force is placed on the bone that is greater than it can withstand. Common causes of fibular fracture include:  Direct hit (trauma) (i.e., hockey or lacrosse check to the lower leg).  Stress fracture (weakening of the bone from repeated stress).  Indirect injury, caused by twisting, turning quickly, or violent muscle contraction. RISK INCREASES WITH:  Contact sports (i.e., football, soccer, lacrosse, hockey).  Sports that can cause twisted ankle injury (i.e., skiing, basketball).  Bony abnormalities (i.e., osteoporosis or bone tumors).  Metabolism disorders, hormone problems, and nutrition deficiency and disorders (i.e., anorexia and bulimia).  Poor strength and flexibility. PREVENTION   Warm up and stretch properly before activity.  Maintain  physical fitness:  Strength, flexibility, and endurance.  Cardiovascular fitness.  Wear properly fitted and padded protective equipment (i.e., shin guards for soccer). PROGNOSIS  If treated properly, fibular fractures usually heal in 4 to 6 weeks.  RELATED COMPLICATIONS   Failure of bone to heal (nonunion).  Bone heals in a poor position (malunion).  Increased pressure inside the leg (compartment syndrome) due to injury that disrupts the blood supply to the leg and foot and injures the nerves and muscles (uncommon).  Shortening of the injured bones.  Hindrance of normal bone growth in children.  Risks of surgery: infection, bleeding, injury to nerves (numbness, weakness, paralysis), need for further surgery.  Longer healing time if activity is resumed too soon. TREATMENT Treatment first involves ice, medicine, and elevation of the leg to reduce pain and inflammation. People with fibular fractures are advised to walk using crutches. A brace or walking boot may be given to restrain the injured leg and allow for healing. Sometimes, surgery is needed to place a rod, plate, or screws in the bones in order to fix the fracture. After surgery, the leg is restrained. After restraint (with or without surgery), it is important to complete strengthening and stretching exercises to regain strength and a full range of motion. Exercises may be completed at home or with a therapist. MEDICATION   If pain medicine is needed, nonsteroidal anti-inflammatory medicines (aspirin and ibuprofen), or other minor pain relievers (acetaminophen), are often advised.  Do not take pain medicine for 7 days before surgery.  Prescription pain relievers may be given if your health care provider thinks they are needed. Use only as directed and only as  much as you need. SEEK MEDICAL CARE IF:  Symptoms get worse or do not improve in 2 weeks, despite treatment.  The following occur after restraint or surgery. (Report  any of these signs immediately):  Swelling above or below the fracture site.  Severe, persistent pain.  Blue or gray skin below the fracture site, especially under the toenails. Numbness or loss of feeling below the fracture site.  New, unexplained symptoms develop. (Drugs used in treatment may produce side effects.)

## 2015-09-14 ENCOUNTER — Observation Stay
Admission: EM | Admit: 2015-09-14 | Discharge: 2015-09-15 | Payer: Medicare Other | Attending: Internal Medicine | Admitting: Internal Medicine

## 2015-09-14 ENCOUNTER — Encounter: Payer: Self-pay | Admitting: Emergency Medicine

## 2015-09-14 ENCOUNTER — Emergency Department: Payer: Medicare Other

## 2015-09-14 DIAGNOSIS — D649 Anemia, unspecified: Secondary | ICD-10-CM

## 2015-09-14 DIAGNOSIS — E1165 Type 2 diabetes mellitus with hyperglycemia: Secondary | ICD-10-CM | POA: Diagnosis not present

## 2015-09-14 DIAGNOSIS — F0390 Unspecified dementia without behavioral disturbance: Secondary | ICD-10-CM | POA: Insufficient documentation

## 2015-09-14 DIAGNOSIS — L97529 Non-pressure chronic ulcer of other part of left foot with unspecified severity: Secondary | ICD-10-CM | POA: Insufficient documentation

## 2015-09-14 DIAGNOSIS — Z79899 Other long term (current) drug therapy: Secondary | ICD-10-CM | POA: Diagnosis not present

## 2015-09-14 DIAGNOSIS — J449 Chronic obstructive pulmonary disease, unspecified: Secondary | ICD-10-CM | POA: Diagnosis not present

## 2015-09-14 DIAGNOSIS — I509 Heart failure, unspecified: Secondary | ICD-10-CM | POA: Insufficient documentation

## 2015-09-14 DIAGNOSIS — Z794 Long term (current) use of insulin: Secondary | ICD-10-CM | POA: Insufficient documentation

## 2015-09-14 DIAGNOSIS — E11621 Type 2 diabetes mellitus with foot ulcer: Secondary | ICD-10-CM | POA: Insufficient documentation

## 2015-09-14 DIAGNOSIS — E785 Hyperlipidemia, unspecified: Secondary | ICD-10-CM | POA: Insufficient documentation

## 2015-09-14 DIAGNOSIS — M869 Osteomyelitis, unspecified: Secondary | ICD-10-CM | POA: Diagnosis not present

## 2015-09-14 DIAGNOSIS — I10 Essential (primary) hypertension: Secondary | ICD-10-CM | POA: Insufficient documentation

## 2015-09-14 DIAGNOSIS — M7989 Other specified soft tissue disorders: Secondary | ICD-10-CM | POA: Diagnosis not present

## 2015-09-14 DIAGNOSIS — M86272 Subacute osteomyelitis, left ankle and foot: Secondary | ICD-10-CM

## 2015-09-14 DIAGNOSIS — Z833 Family history of diabetes mellitus: Secondary | ICD-10-CM | POA: Insufficient documentation

## 2015-09-14 DIAGNOSIS — R55 Syncope and collapse: Secondary | ICD-10-CM | POA: Diagnosis present

## 2015-09-14 DIAGNOSIS — E162 Hypoglycemia, unspecified: Secondary | ICD-10-CM | POA: Diagnosis present

## 2015-09-14 LAB — BASIC METABOLIC PANEL
ANION GAP: 9 (ref 5–15)
BUN: 17 mg/dL (ref 6–20)
CHLORIDE: 105 mmol/L (ref 101–111)
CO2: 22 mmol/L (ref 22–32)
Calcium: 9.9 mg/dL (ref 8.9–10.3)
Creatinine, Ser: 0.92 mg/dL (ref 0.61–1.24)
Glucose, Bld: 48 mg/dL — ABNORMAL LOW (ref 65–99)
POTASSIUM: 4.3 mmol/L (ref 3.5–5.1)
SODIUM: 136 mmol/L (ref 135–145)

## 2015-09-14 LAB — CBC
HEMATOCRIT: 32.1 % — AB (ref 40.0–52.0)
HEMATOCRIT: 31.6 % — AB (ref 40.0–52.0)
Hemoglobin: 10.7 g/dL — ABNORMAL LOW (ref 13.0–18.0)
Hemoglobin: 10.3 g/dL — ABNORMAL LOW (ref 13.0–18.0)
MCH: 27.8 pg (ref 26.0–34.0)
MCH: 27.6 pg (ref 26.0–34.0)
MCHC: 33.2 g/dL (ref 32.0–36.0)
MCHC: 32.7 g/dL (ref 32.0–36.0)
MCV: 83.6 fL (ref 80.0–100.0)
MCV: 84.3 fL (ref 80.0–100.0)
PLATELETS: 248 10*3/uL (ref 150–440)
Platelets: 264 10*3/uL (ref 150–440)
RBC: 3.84 MIL/uL — AB (ref 4.40–5.90)
RBC: 3.74 MIL/uL — AB (ref 4.40–5.90)
RDW: 16.6 % — AB (ref 11.5–14.5)
RDW: 16.3 % — ABNORMAL HIGH (ref 11.5–14.5)
WBC: 5.7 10*3/uL (ref 3.8–10.6)
WBC: 6 10*3/uL (ref 3.8–10.6)

## 2015-09-14 LAB — GLUCOSE, CAPILLARY
GLUCOSE-CAPILLARY: 65 mg/dL (ref 65–99)
GLUCOSE-CAPILLARY: 161 mg/dL — AB (ref 65–99)
Glucose-Capillary: 49 mg/dL — ABNORMAL LOW (ref 65–99)
Glucose-Capillary: 54 mg/dL — ABNORMAL LOW (ref 65–99)
Glucose-Capillary: 108 mg/dL — ABNORMAL HIGH (ref 65–99)
Glucose-Capillary: 64 mg/dL — ABNORMAL LOW (ref 65–99)
Glucose-Capillary: 96 mg/dL (ref 65–99)
Glucose-Capillary: 131 mg/dL — ABNORMAL HIGH (ref 65–99)

## 2015-09-14 LAB — URINALYSIS COMPLETE WITH MICROSCOPIC (ARMC ONLY)
BACTERIA UA: NONE SEEN
BILIRUBIN URINE: NEGATIVE
GLUCOSE, UA: NEGATIVE mg/dL
HGB URINE DIPSTICK: NEGATIVE
Ketones, ur: NEGATIVE mg/dL
NITRITE: NEGATIVE
PH: 5 (ref 5.0–8.0)
Protein, ur: NEGATIVE mg/dL
SPECIFIC GRAVITY, URINE: 1.019 (ref 1.005–1.030)

## 2015-09-14 LAB — CREATININE, SERUM: Creatinine, Ser: 0.9 mg/dL (ref 0.61–1.24)

## 2015-09-14 LAB — TROPONIN I

## 2015-09-14 LAB — C-REACTIVE PROTEIN: CRP: 0.6 mg/dL (ref <1.0)

## 2015-09-14 LAB — SEDIMENTATION RATE: Sed Rate: 52 mm/hr — ABNORMAL HIGH (ref 0–20)

## 2015-09-14 MED ORDER — OXYCODONE HCL 5 MG PO TABS: 5.0000 mg | ORAL_TABLET | ORAL | Status: DC | PRN

## 2015-09-14 MED ORDER — ONDANSETRON HCL 4 MG/2ML IJ SOLN: 4.0000 mg | Freq: Four times a day (QID) | INTRAMUSCULAR | Status: DC | PRN

## 2015-09-14 MED ORDER — INSULIN ASPART 100 UNIT/ML ~~LOC~~ SOLN: 0.0000 [IU] | Freq: Every day | SUBCUTANEOUS | Status: DC

## 2015-09-14 MED ORDER — MONTELUKAST SODIUM 10 MG PO TABS
10.0000 mg | ORAL_TABLET | Freq: Every day | ORAL | Status: DC
  Administered 2015-09-15: 10 mg via ORAL
  Filled 2015-09-14: qty 1

## 2015-09-14 MED ORDER — RAMIPRIL 5 MG PO CAPS
10.0000 mg | ORAL_CAPSULE | Freq: Every day | ORAL | Status: DC
  Administered 2015-09-15: 10 mg via ORAL
  Filled 2015-09-14: qty 2

## 2015-09-14 MED ORDER — ONDANSETRON HCL 4 MG PO TABS: 4.0000 mg | ORAL_TABLET | Freq: Four times a day (QID) | ORAL | Status: DC | PRN

## 2015-09-14 MED ORDER — CARVEDILOL 12.5 MG PO TABS
50.0000 mg | ORAL_TABLET | Freq: Two times a day (BID) | ORAL | Status: DC
  Administered 2015-09-15: 50 mg via ORAL
  Filled 2015-09-14: qty 4

## 2015-09-14 MED ORDER — HEPARIN SODIUM (PORCINE) 5000 UNIT/ML IJ SOLN
5000.0000 [IU] | Freq: Three times a day (TID) | INTRAMUSCULAR | Status: DC
  Administered 2015-09-14 – 2015-09-15 (×3): 5000 [IU] via SUBCUTANEOUS
  Filled 2015-09-14 (×3): qty 1

## 2015-09-14 MED ORDER — TRAMADOL HCL 50 MG PO TABS: 50.0000 mg | ORAL_TABLET | Freq: Four times a day (QID) | ORAL | Status: DC | PRN

## 2015-09-14 MED ORDER — LORATADINE 10 MG PO TABS
10.0000 mg | ORAL_TABLET | Freq: Every day | ORAL | Status: DC
  Administered 2015-09-15: 10 mg via ORAL
  Filled 2015-09-14: qty 1

## 2015-09-14 MED ORDER — CLOPIDOGREL BISULFATE 75 MG PO TABS
75.0000 mg | ORAL_TABLET | Freq: Every day | ORAL | Status: DC
  Administered 2015-09-15: 75 mg via ORAL
  Filled 2015-09-14: qty 1

## 2015-09-14 MED ORDER — INSULIN ASPART 100 UNIT/ML ~~LOC~~ SOLN
0.0000 [IU] | Freq: Three times a day (TID) | SUBCUTANEOUS | Status: DC
  Administered 2015-09-15 (×2): 1 [IU] via SUBCUTANEOUS
  Filled 2015-09-14 (×2): qty 1

## 2015-09-14 MED ORDER — DEXTROSE-NACL 5-0.9 % IV SOLN
INTRAVENOUS | Status: DC
  Administered 2015-09-14 – 2015-09-15 (×2): via INTRAVENOUS

## 2015-09-14 MED ORDER — ACETAMINOPHEN 650 MG RE SUPP: 650.0000 mg | Freq: Four times a day (QID) | RECTAL | Status: DC | PRN

## 2015-09-14 MED ORDER — PIPERACILLIN-TAZOBACTAM 3.375 G IVPB
3.3750 g | Freq: Three times a day (TID) | INTRAVENOUS | Status: DC
  Administered 2015-09-14 – 2015-09-15 (×4): 3.375 g via INTRAVENOUS
  Filled 2015-09-14 (×6): qty 50

## 2015-09-14 MED ORDER — ACETAMINOPHEN 325 MG PO TABS: 650.0000 mg | ORAL_TABLET | Freq: Four times a day (QID) | ORAL | Status: DC | PRN

## 2015-09-14 MED ORDER — MORPHINE SULFATE (PF) 2 MG/ML IV SOLN: 2.0000 mg | INTRAVENOUS | Status: DC | PRN

## 2015-09-14 MED ORDER — ATORVASTATIN CALCIUM 20 MG PO TABS
20.0000 mg | ORAL_TABLET | Freq: Every day | ORAL | Status: DC
  Administered 2015-09-15: 20 mg via ORAL
  Filled 2015-09-14: qty 1

## 2015-09-14 MED ORDER — FLUTICASONE PROPIONATE 50 MCG/ACT NA SUSP
1.0000 | Freq: Every day | NASAL | Status: DC
  Administered 2015-09-15: 1 via NASAL
  Filled 2015-09-14: qty 16

## 2015-09-14 MED ORDER — AMLODIPINE BESYLATE 5 MG PO TABS
5.0000 mg | ORAL_TABLET | Freq: Every day | ORAL | Status: DC
  Administered 2015-09-15: 5 mg via ORAL
  Filled 2015-09-14: qty 1

## 2015-09-14 MED ORDER — PIPERACILLIN-TAZOBACTAM 3.375 G IVPB 30 MIN: 3.3750 g | Freq: Once | INTRAVENOUS | Status: DC

## 2015-09-14 MED ORDER — VANCOMYCIN HCL 10 G IV SOLR
1500.0000 mg | Freq: Once | INTRAVENOUS | Status: AC
  Administered 2015-09-14: 1500 mg via INTRAVENOUS
  Filled 2015-09-14: qty 1500

## 2015-09-14 NOTE — ED Notes (Signed)
Blood Glucose = 108

## 2015-09-14 NOTE — Consult Note (Signed)
Reason for Consult: Ulceration with discoloration left second toe Referring Physician: [facility]  Philip Garrett is an 82 y.o. male.  HPI: Philip Garrett relates that he did have an injury about a week ago where he stubbed his toe and some skin came off. States he has not really had any pain with the toe. He did present to the emergency department today with low blood sugars and there was concern about the ulceration and discoloration in the second toe with some possible early gangrene. Patient was admitted for stabilization of his blood sugars and for evaluation of the toe.  Past Medical History  Diagnosis Date  . Diabetes mellitus without complication ([place])   . Hypertension   . CHF (congestive heart failure) ([place])     History reviewed. No pertinent past surgical history.  Family History  Problem Relation Age of Onset  . Diabetes Other     Social History:  reports that he has never smoked. He does not have any smokeless tobacco history on file. He reports that he does not drink alcohol. His drug history is not on file.  Allergies: No Known Allergies  Medications:  Scheduled: . amLODipine  5 mg Oral Daily  . atorvastatin  20 mg Oral Daily  . carvedilol  50 mg Oral BID WC  . clopidogrel  75 mg Oral Daily  . fluticasone  1 spray Each Nare Daily  . heparin  5,000 Units Subcutaneous 3 times per day  . insulin aspart  0-5 Units Subcutaneous QHS  . insulin aspart  0-9 Units Subcutaneous TID WC  . loratadine  10 mg Oral Daily  . montelukast  10 mg Oral Daily  . piperacillin-tazobactam  3.375 g Intravenous Once  . piperacillin-tazobactam (ZOSYN)  IV  3.375 g Intravenous 3 times per day  . ramipril  10 mg Oral Daily    Results for orders placed or performed during the hospital encounter of 09/14/15 (from the past 48 hour(s))  Glucose, capillary     Status: Abnormal   Collection Time: 09/14/15 11:37 AM  Result Value Ref Range   Glucose-Capillary 49 (L) 65 - 99 mg/dL   Basic metabolic panel     Status: Abnormal   Collection Time: 09/14/15 11:47 AM  Result Value Ref Range   Sodium 136 135 - 145 mmol/L   Potassium 4.3 3.5 - 5.1 mmol/L   Chloride 105 101 - 111 mmol/L   CO2 22 22 - 32 mmol/L   Glucose, Bld 48 (L) 65 - 99 mg/dL   BUN 17 6 - 20 mg/dL   Creatinine, Ser 0.92 0.61 - 1.24 mg/dL   Calcium 9.9 8.9 - 10.3 mg/dL   GFR calc non Af Amer >60 >60 mL/min   GFR calc Af Amer >60 >60 mL/min    Comment: (NOTE) The eGFR has been calculated using the CKD EPI equation. This calculation has not been validated in all clinical situations. eGFR's persistently <60 mL/min signify possible Chronic Kidney Disease.    Anion gap 9 5 - 15  CBC     Status: Abnormal   Collection Time: 09/14/15 11:47 AM  Result Value Ref Range   WBC 5.7 3.8 - 10.6 K/uL   RBC 3.84 (L) 4.40 - 5.90 MIL/uL   Hemoglobin 10.7 (L) 13.0 - 18.0 g/dL   HCT 32.1 (L) 40.0 - 52.0 %   MCV 83.6 80.0 - 100.0 fL   MCH 27.8 26.0 - 34.0 pg   MCHC 33.2 32.0 - 36.0 g/dL  RDW 16.6 (H) 11.5 - 14.5 %   Platelets 264 150 - 440 K/uL  Troponin I     Status: None   Collection Time: 09/14/15 11:47 AM  Result Value Ref Range   Troponin I <0.03 <0.031 ng/mL    Comment:        NO INDICATION OF MYOCARDIAL INJURY.   C-reactive protein     Status: None   Collection Time: 09/14/15 11:48 AM  Result Value Ref Range   CRP 0.6 <1.0 mg/dL    Comment: Performed at [facility]  Sedimentation rate     Status: Abnormal   Collection Time: 09/14/15 11:48 AM  Result Value Ref Range   Sed Rate 52 (H) 0 - 20 mm/hr  Glucose, capillary     Status: Abnormal   Collection Time: 09/14/15  1:05 PM  Result Value Ref Range   Glucose-Capillary 54 (L) 65 - 99 mg/dL  Glucose, capillary     Status: Abnormal   Collection Time: 09/14/15  2:36 PM  Result Value Ref Range   Glucose-Capillary 108 (H) 65 - 99 mg/dL  Glucose, capillary     Status: None   Collection Time: 09/14/15  3:32 PM  Result Value Ref Range    Glucose-Capillary 96 65 - 99 mg/dL  CBC     Status: Abnormal   Collection Time: 09/14/15  4:08 PM  Result Value Ref Range   WBC 6.0 3.8 - 10.6 K/uL   RBC 3.74 (L) 4.40 - 5.90 MIL/uL   Hemoglobin 10.3 (L) 13.0 - 18.0 g/dL   HCT 31.6 (L) 40.0 - 52.0 %   MCV 84.3 80.0 - 100.0 fL   MCH 27.6 26.0 - 34.0 pg   MCHC 32.7 32.0 - 36.0 g/dL   RDW 16.3 (H) 11.5 - 14.5 %   Platelets 248 150 - 440 K/uL  Creatinine, serum     Status: None   Collection Time: 09/14/15  4:08 PM  Result Value Ref Range   Creatinine, Ser 0.90 0.61 - 1.24 mg/dL   GFR calc non Af Amer >60 >60 mL/min   GFR calc Af Amer >60 >60 mL/min    Comment: (NOTE) The eGFR has been calculated using the CKD EPI equation. This calculation has not been validated in all clinical situations. eGFR's persistently <60 mL/min signify possible Chronic Kidney Disease.   Glucose, capillary     Status: Abnormal   Collection Time: 09/14/15  4:26 PM  Result Value Ref Range   Glucose-Capillary 64 (L) 65 - 99 mg/dL  Glucose, capillary     Status: None   Collection Time: 09/14/15  4:38 PM  Result Value Ref Range   Glucose-Capillary 65 65 - 99 mg/dL  Urinalysis complete, with microscopic ([facility] only)     Status: Abnormal   Collection Time: 09/14/15  5:18 PM  Result Value Ref Range   Color, Urine YELLOW (A) YELLOW   APPearance HAZY (A) CLEAR   Glucose, UA NEGATIVE NEGATIVE mg/dL   Bilirubin Urine NEGATIVE NEGATIVE   Ketones, ur NEGATIVE NEGATIVE mg/dL   Specific Gravity, Urine 1.019 1.005 - 1.030   Hgb urine dipstick NEGATIVE NEGATIVE   pH 5.0 5.0 - 8.0   Protein, ur NEGATIVE NEGATIVE mg/dL   Nitrite NEGATIVE NEGATIVE   Leukocytes, UA 3+ (A) NEGATIVE   RBC / HPF 0-5 0 - 5 RBC/hpf   WBC, UA 6-30 0 - 5 WBC/hpf   Bacteria, UA NONE SEEN NONE SEEN   Squamous Epithelial / LPF 0-5 (A) NONE  SEEN   Mucous PRESENT   Glucose, capillary     Status: Abnormal   Collection Time: 09/14/15  5:31 PM  Result Value Ref Range   Glucose-Capillary 131 (H)  65 - 99 mg/dL    Dg Toe 2nd Left  09/14/2015  CLINICAL DATA:  Syncope, hit second toe last Tuesday EXAM: LEFT SECOND TOE COMPARISON:  None. FINDINGS: Three views of the left second toe submitted. There is diffuse soft tissue swelling second toe. No displaced fracture or subluxation. There is some periosteal reaction mid shaft of proximal phalanx. Healing stress fracture cannot be excluded. Question nondisplaced fracture at the base of proximal phalanx third toe. IMPRESSION: There is diffuse soft tissue swelling second toe. No displaced fracture or subluxation. There is some periosteal reaction mid shaft of proximal phalanx. Healing stress fracture cannot be excluded. Question nondisplaced fracture at the base of proximal phalanx third toe. Electronically Signed   By: Lahoma Crocker M.D.   On: 09/14/2015 13:36    Review of Systems  Constitutional: Negative.   HENT: Negative.   Eyes: Negative.   Respiratory: Negative.   Cardiovascular: Negative.   Gastrointestinal: Negative.   Genitourinary: Negative.   Musculoskeletal: Negative.   Skin: Negative.   Neurological: Negative.   Endo/Heme/Allergies: Negative.   Psychiatric/Behavioral: Negative.    Blood pressure 150/80, pulse 92, temperature 97.8 F (36.6 C), temperature source Oral, resp. rate 17, height 6' 5"  (1.956 m), weight 113.399 kg (250 lb), SpO2 100 %. Physical Exam  Cardiovascular:  DP and PT pulses are palpable bilateral. Capillary filling time appears intact  Musculoskeletal:  Previous amputation of the left great toe. Significant hammertoe contractures are noted on the lesser toes of the left foot. Muscle testing is deferred  Neurological:  Protective threshold with a monofilament wire is absent to the forefoot and toes bilateral. Proprioception is impaired bilateral.  Skin:  The skin is warm dry and mildly atrophic with some bilateral hyperpigmentation's. Significant edema in the left second toe with dark discoloration of the skin.  A full-thickness ulceration is present over the dorsal proximal interphalangeal joint approximately 6-7 mm diameter. Granular base with no clear extension through the deeper tissues. No evidence of exposed bone or capsular material. The discoloration appears to be more related to inflammation rather than vascular in nature    Assessment/Plan: Assessment: 1. Full-thickness ulceration left second toe, no clear evidence of osteomyelitis. 2. Diabetes with associated neuropathy.  Plan: Sterile saline wet-to-dry dressing applied to the second toe. Once the patient is stable I think he will be fine to be discharged with daily dressing changes consisting of Bactroban ointment and a light gauze bandage. Would recommend no shoes on the left foot to try to relieve pressure off the ulcerative area. Should do fine on oral antibiotics. We will follow him up in the office in a couple of weeks for reevaluation as well as to re-x-ray the toe to evaluate for possible fracture  Assessment: Full-thickness ulceration left second toe, no clear evidence of osteomyelitis. Diabetes with associated neuropathy  Durward Fortes 09/14/2015, 7:42 PM

## 2015-09-14 NOTE — ED Provider Notes (Signed)
[facility] Emergency Department Provider Note  ____________________________________________  Time seen: Approximately 1:09 PM  I have reviewed the triage vital signs and the nursing notes.   HISTORY  Chief Complaint Near Syncope  History is limited due to patient dementia.  HPI Philip Garrett is a 82 y.o. male history of diabetes treated with insulin and metformin,HTN, left second toe chronic wound, presenting for left toe swelling and a syncopal episode 3 days ago, hypoglycemia today. The patient reports that on Saturday he was walking when he began to feel lightheaded and had a brief syncopal episode. He denies hitting his head or sustaining any injury. He states his blood sugar was checked on Saturday and it was between 9's, 48s or 80s, but he is not sure and he has baseline dementia so it is unclear whether he can remember this episode. He reports that his toe has chronically been no black and discolored, and that recently he "pulled a scab off of the top." He denies any significant pain, redness, trauma, fever, chills, nausea or vomiting. This morning, he was found to be hypoglycemic the blood sugar in the 60s, then in the 50s at the nursing home, and 49 in the emergency department. He denies any symptoms associated with this at this time.   Past Medical History  Diagnosis Date  . Diabetes mellitus without complication ([place])   . Hypertension   . CHF (congestive heart failure) ([place])     There are no active problems to display for this patient.   History reviewed. No pertinent past surgical history.  Current Outpatient Rx  Name  Route  Sig  Dispense  Refill  . acetaminophen (TYLENOL) 500 MG tablet   Oral   Take 500 mg by mouth every 6 (six) hours as needed.         Marland Kitchen amLODipine (NORVASC) 5 MG tablet   Oral   Take 5 mg by mouth daily.         Marland Kitchen atorvastatin (LIPITOR) 20 MG tablet   Oral   Take 20 mg by mouth daily.         . carvedilol  (COREG) 25 MG tablet   Oral   Take 50 mg by mouth 2 (two) times daily with a meal.         . clopidogrel (PLAVIX) 75 MG tablet   Oral   Take 75 mg by mouth daily.         . fluticasone (FLONASE) 50 MCG/ACT nasal spray   Each Nare   Place 1 spray into both nostrils daily.         . insulin aspart (NOVOLOG) 100 UNIT/ML injection   Subcutaneous   Inject 6 Units into the skin 2 (two) times daily as needed for high blood sugar (if blood sugar is higher than 450).         . insulin detemir (LEVEMIR) 100 UNIT/ML injection   Subcutaneous   Inject 38-62 Units into the skin 2 (two) times daily. 62 units every morning and 38 units bedtime         . linagliptin (TRADJENTA) 5 MG TABS tablet   Oral   Take 5 mg by mouth daily.         Marland Kitchen loratadine (CLARITIN) 10 MG tablet   Oral   Take 10 mg by mouth daily.         . metFORMIN (GLUCOPHAGE) 1000 MG tablet   Oral   Take 1,000 mg by mouth 2 (  two) times daily with a meal.         . montelukast (SINGULAIR) 10 MG tablet   Oral   Take 10 mg by mouth daily.         . ramipril (ALTACE) 10 MG capsule   Oral   Take 10 mg by mouth daily.         . Skin Protectants, Misc. (EUCERIN) cream   Topical   Apply 1 application topically 2 (two) times daily.          . traMADol (ULTRAM) 50 MG tablet   Oral   Take 1 tablet (50 mg total) by mouth every 6 (six) hours as needed for severe pain.   20 tablet   0     Allergies Review of patient's allergies indicates no known allergies.  No family history on file.  Social History Social History  Substance Use Topics  . Smoking status: Never Smoker   . Smokeless tobacco: None  . Alcohol Use: No    Review of Systems Constitutional: No fever/chills.Positive lightheadedness. Positive syncope. Eyes: No visual changes. ENT: No sore throat. No congestion or rhinorrhea. Cardiovascular: Denies chest pain. Denies palpitations. Respiratory: Denies shortness of breath.  No  cough. Gastrointestinal: No abdominal pain.  No nausea, no vomiting.  No diarrhea.  No constipation. Genitourinary: Negative for dysuria. Musculoskeletal: Negative for back pain. Positive for remote left great toe amputation, chronic nonhealing ulcer of the left second toe. Skin: Negative for rash. Neurological: Negative for headaches. No focal numbness, tingling or weakness.  Endocrine:Positive hypoglycemia 10-point ROS otherwise negative.  ____________________________________________   PHYSICAL EXAM:  VITAL SIGNS: ED Triage Vitals  Enc Vitals Group     BP 09/14/15 1145 137/69 mmHg     Pulse Rate 09/14/15 1145 86     Resp 09/14/15 1145 16     Temp 09/14/15 1145 97.8 F (36.6 C)     Temp Source 09/14/15 1145 Oral     SpO2 09/14/15 1145 99 %     Weight 09/14/15 1145 250 lb (113.399 kg)     Height 09/14/15 1145 6\' 5"  (1.956 m)     Head Cir --      Peak Flow --      Pain Score --      Pain Loc --      Pain Edu? --      Excl. in [place]? --     Constitutional: Patient is alert and answering questions although he does have some dementia. He is chronically ill appearing but in no acute distress.  Eyes: Conjunctivae are normal.  EOMI. No scleral icterus. Head: Atraumatic. Nose: No congestion/rhinnorhea. Mouth/Throat: Mucous membranes are moist.  Neck: No stridor.  Supple.  No JVD. Cardiovascular: Normal rate, regular rhythm. No murmurs, rubs or gallops.  Respiratory: Normal respiratory effort.  No accessory muscle use or retractions. Lungs CTAB.  No wheezes, rales or ronchi. Gastrointestinal: Soft, nontender and nondistended.  No guarding or rebound.  No peritoneal signs. Musculoskeletal: No LE edema. No ttp in the calves or palpable cords.  Negative Homan's sign. Remote left great toe amputation with a well-healed incision. Left second toe is mildly swollen with black discoloration but no erythema or warmth. He has a overlying 1.5 x 1.5 circular open wound which is bleeding but  without any purulent discharge. I am able to range the toe without any pain. Normal DP and PT pulse of the left foot. Neurologic:  Alert.  Speech is clear.  Face and smile  are symmetric.  EOMI.  Moves all extremities well. Skin:  Skin is warm, dry . No rash noted. Psychiatric: Mood and affect are normal. Speech and behavior are normal.  Normal judgement.  ____________________________________________   LABS (all labs ordered are listed, but only abnormal results are displayed)  Labs Reviewed  BASIC METABOLIC PANEL - Abnormal; Notable for the following:    Glucose, Bld 48 (*)    All other components within normal limits  CBC - Abnormal; Notable for the following:    RBC 3.84 (*)    Hemoglobin 10.7 (*)    HCT 32.1 (*)    RDW 16.6 (*)    All other components within normal limits  GLUCOSE, CAPILLARY - Abnormal; Notable for the following:    Glucose-Capillary 49 (*)    All other components within normal limits  GLUCOSE, CAPILLARY - Abnormal; Notable for the following:    Glucose-Capillary 54 (*)    All other components within normal limits  SEDIMENTATION RATE - Abnormal; Notable for the following:    Sed Rate 52 (*)    All other components within normal limits  TROPONIN I  URINALYSIS COMPLETEWITH MICROSCOPIC ([facility] ONLY)  C-REACTIVE PROTEIN   ____________________________________________  EKG  ED ECG REPORT I, Eula Listen, the attending physician, personally viewed and interpreted this ECG.   Date: 09/14/2015  EKG Time: 1143  Rate: 86  Rhythm: normal sinus rhythm  Axis: normal  Intervals:none  ST&T Change: No STEMI.  ____________________________________________  RADIOLOGY  Dg Toe 2nd Left  09/14/2015  CLINICAL DATA:  Syncope, hit second toe last Tuesday EXAM: LEFT SECOND TOE COMPARISON:  None. FINDINGS: Three views of the left second toe submitted. There is diffuse soft tissue swelling second toe. No displaced fracture or subluxation. There is some periosteal  reaction mid shaft of proximal phalanx. Healing stress fracture cannot be excluded. Question nondisplaced fracture at the base of proximal phalanx third toe. IMPRESSION: There is diffuse soft tissue swelling second toe. No displaced fracture or subluxation. There is some periosteal reaction mid shaft of proximal phalanx. Healing stress fracture cannot be excluded. Question nondisplaced fracture at the base of proximal phalanx third toe. Electronically Signed   By: Lahoma Crocker M.D.   On: 09/14/2015 13:36    ____________________________________________   PROCEDURES  Procedure(s) performed: None  Critical Care performed: No ____________________________________________   INITIAL IMPRESSION / ASSESSMENT AND PLAN / ED COURSE  Pertinent labs & imaging results that were available during my care of the patient were reviewed by me and considered in my medical decision making (see chart for details).  82 y.o. M w/ nonhealing foot ulcer and possible increased swelling, no fever, positive hypoglycemia today and maybe on Saturday with possible associated brief syncopal episode w/o any known injury.  We will continue to check the patient's blood sugars until he has at least 3 hours of normal blood sugars. The patient is on a large dose of insulin as well as metformin, both of which he received this morning. The driving force for his hypoglycemia may be iatrogenic, and worsened by not eating and drinking. We'll also consider acute infection as a possible cause. He is not having symptoms that would be consistent with a cardiac cause for his hypoglycemic episodes. His syncope may be due to the hypoglycemia or have another cause of his toes arrhythmia, but given that it happened 3 days ago, I will get a EKG and labs, but it is possible that we may not know the cause.  The  pt is chronically anemia and his blood counts today are unchanged from previous.   ----------------------------------------- 2:38 PM on  09/14/2015 -----------------------------------------  The patient has remained hemodynamically stable and continues to mentate at baseline. His x-ray does show some periosteal reaction on the second proximal phalanx. There is also question of a nondisplaced fracture at the base of the proximal phalanx third toe. The patient had a sedimentation rate of 52.  The most recent blood sugar from the patient is 108, but his previous 2 blood sugars were in the 50s despite eating. It is likely that his hypoglycemia is driven by an aggressive diabetes regimen and that in his demented state he does not eat at regular intervals.  However, I am concerned that infection in the second toe may be driving his hypoglycemia as well.  I've spoken with Dr. Caryl Comes, the podiatrist on-call, who recommends inpatient evaluation for possible indication of the great toe versus conservative management as well as a vascular consultation for healing.  I'll plan to admit the patient to the hospitalist and initiate antibiotics.   ____________________________________________  FINAL CLINICAL IMPRESSION(S) / ED DIAGNOSES  Final diagnoses:  Hypoglycemia  Chronic anemia  Syncope, unspecified syncope type      NEW MEDICATIONS STARTED DURING THIS VISIT:  New Prescriptions   No medications on file     Eula Listen, MD 09/14/15 1445

## 2015-09-14 NOTE — H&P (Addendum)
[facility] NAME: Philip Garrett    MR#:  [id number]  DATE OF BIRTH:  [birth year]   DATE OF ADMISSION:  09/14/2015  PRIMARY CARE PHYSICIAN: Volanda Napoleon, MD   REQUESTING/REFERRING PHYSICIAN Mariea Clonts CHIEF COMPLAINT:   Chief Complaint  Patient presents with  . Near Syncope   low blood sugar   HISTORY OF PRESENT ILLNESS:  Philip Garrett  is a 82 y.o. male with a known history of type 2 diabetes insulin requiring presenting after near syncopal episode from nursing facility. He is noted to be weak and tired on EMS arrival noted to have low blood glucose in the 40s, after initial treatment his blood sugar is still ranging between 50s to finally one reading of 100. He is on a fairly strict insulin regiment and has poor oral intake. Of note about 1 week ago suffered a fall and scraped his second toe of the left foot suffering an abrasion. He's noticed increased swelling of the toe no fevers chills or discharge. Emergency department staff discussed this with podiatry recommending antibiotics  PAST MEDICAL HISTORY:   Past Medical History  Diagnosis Date  . Diabetes mellitus without complication ([place])   . Hypertension   . CHF (congestive heart failure) ([place])     PAST SURGICAL HISTORY:  History reviewed. No pertinent past surgical history.  SOCIAL HISTORY:   Social History  Substance Use Topics  . Smoking status: Never Smoker   . Smokeless tobacco: Not on file  . Alcohol Use: No    FAMILY HISTORY:   Family History  Problem Relation Age of Onset  . Diabetes Other     DRUG ALLERGIES:  No Known Allergies  REVIEW OF SYSTEMS:  REVIEW OF SYSTEMS:  CONSTITUTIONAL: Denies fevers, chills, positive fatigue, weakness.  EYES: Denies blurred vision, double vision, or eye pain.  EARS, NOSE, THROAT: Denies tinnitus, ear pain, hearing loss.  RESPIRATORY: denies cough, shortness of breath, wheezing  CARDIOVASCULAR: Denies chest pain,  palpitations, edema.  GASTROINTESTINAL: Denies nausea, vomiting, diarrhea, abdominal pain.  GENITOURINARY: Denies dysuria, hematuria.  ENDOCRINE: Denies nocturia or thyroid problems. HEMATOLOGIC AND LYMPHATIC: Denies easy bruising or bleeding.  SKIN: Denies rash or lesions.  MUSCULOSKELETAL: Denies pain in neck, back, shoulder, knees, hips, or further arthritic symptoms.  NEUROLOGIC: Denies paralysis, paresthesias.  PSYCHIATRIC: Denies anxiety or depressive symptoms. Otherwise full review of systems performed by me is negative.   MEDICATIONS AT HOME:   Prior to Admission medications   Medication Sig Start Date End Date Taking? Authorizing Provider  acetaminophen (TYLENOL) 500 MG tablet Take 500 mg by mouth every 6 (six) hours as needed.   Yes Historical Provider, MD  amLODipine (NORVASC) 5 MG tablet Take 5 mg by mouth daily.   Yes Historical Provider, MD  atorvastatin (LIPITOR) 20 MG tablet Take 20 mg by mouth daily.   Yes Historical Provider, MD  carvedilol (COREG) 25 MG tablet Take 50 mg by mouth 2 (two) times daily with a meal.   Yes Historical Provider, MD  clopidogrel (PLAVIX) 75 MG tablet Take 75 mg by mouth daily.   Yes Historical Provider, MD  fluticasone (FLONASE) 50 MCG/ACT nasal spray Place 1 spray into both nostrils daily.   Yes Historical Provider, MD  insulin aspart (NOVOLOG) 100 UNIT/ML injection Inject 6 Units into the skin 2 (two) times daily as needed for high blood sugar (if blood sugar is higher than 450).   Yes Historical Provider, MD  insulin detemir (  LEVEMIR) 100 UNIT/ML injection Inject 38-62 Units into the skin 2 (two) times daily. 62 units every morning and 38 units bedtime   Yes Historical Provider, MD  linagliptin (TRADJENTA) 5 MG TABS tablet Take 5 mg by mouth daily.   Yes Historical Provider, MD  loratadine (CLARITIN) 10 MG tablet Take 10 mg by mouth daily.   Yes Historical Provider, MD  metFORMIN (GLUCOPHAGE) 1000 MG tablet Take 1,000 mg by mouth 2 (two) times  daily with a meal.   Yes Historical Provider, MD  montelukast (SINGULAIR) 10 MG tablet Take 10 mg by mouth daily.   Yes Historical Provider, MD  ramipril (ALTACE) 10 MG capsule Take 10 mg by mouth daily.   Yes Historical Provider, MD  Skin Protectants, Misc. (EUCERIN) cream Apply 1 application topically 2 (two) times daily.    Yes Historical Provider, MD  traMADol (ULTRAM) 50 MG tablet Take 1 tablet (50 mg total) by mouth every 6 (six) hours as needed for severe pain. 07/24/15 07/23/16 Yes Nance Pear, MD      VITAL SIGNS:  Blood pressure 116/68, pulse 79, temperature 97.8 F (36.6 C), temperature source Oral, resp. rate 17, height 6\' 5"  (1.956 m), weight 113.399 kg (250 lb), SpO2 98 %.  PHYSICAL EXAMINATION:  VITAL SIGNS: Filed Vitals:   09/14/15 1145 09/14/15 1456  BP: 137/69 116/68  Pulse: 86 79  Temp: 97.8 F (36.6 C)   Resp: 16 17   GENERAL:83 y.o.male currently in no acute distress.  HEAD: Normocephalic, atraumatic.  EYES: Pupils equal, round, reactive to light. Extraocular muscles intact. No scleral icterus.  MOUTH: Moist mucosal membrane. Dentition intact. No abscess noted.  EAR, NOSE, THROAT: Clear without exudates. No external lesions.  NECK: Supple. No thyromegaly. No nodules. No JVD.  PULMONARY: Clear to ascultation, without wheeze rails or rhonci. No use of accessory muscles, Good respiratory effort. good air entry bilaterally CHEST: Nontender to palpation.  CARDIOVASCULAR: S1 and S2. Regular rate and rhythm. No murmurs, rubs, or gallops. No edema. Pedal pulses 2+ bilaterally.  GASTROINTESTINAL: Soft, nontender, nondistended. No masses. Positive bowel sounds. No hepatosplenomegaly.  MUSCULOSKELETAL: No swelling, clubbing, or edema. Range of motion full in all extremities.  NEUROLOGIC: Cranial nerves II through XII are intact. No gross focal neurological deficits. Sensation intact. Reflexes intact.  SKIN: Approximately 1 x 1 cm ulceration with granulation tissue  overlying second toe left foot otherwise No ulceration, lesions, rashes, or cyanosis. Skin warm and dry. Turgor intact.  PSYCHIATRIC: Mood, affect within normal limits. The patient is awake, alert and oriented x 3. Insight, judgment intact.    LABORATORY PANEL:   CBC  Recent Labs Lab 09/14/15 1147  WBC 5.7  HGB 10.7*  HCT 32.1*  PLT 264   ------------------------------------------------------------------------------------------------------------------  Chemistries   Recent Labs Lab 09/14/15 1147  NA 136  K 4.3  CL 105  CO2 22  GLUCOSE 48*  BUN 17  CREATININE 0.92  CALCIUM 9.9   ------------------------------------------------------------------------------------------------------------------  Cardiac Enzymes  Recent Labs Lab 09/14/15 1147  TROPONINI <0.03   ------------------------------------------------------------------------------------------------------------------  RADIOLOGY:  Dg Toe 2nd Left  09/14/2015  CLINICAL DATA:  Syncope, hit second toe last Tuesday EXAM: LEFT SECOND TOE COMPARISON:  None. FINDINGS: Three views of the left second toe submitted. There is diffuse soft tissue swelling second toe. No displaced fracture or subluxation. There is some periosteal reaction mid shaft of proximal phalanx. Healing stress fracture cannot be excluded. Question nondisplaced fracture at the base of proximal phalanx third toe. IMPRESSION: There is diffuse  soft tissue swelling second toe. No displaced fracture or subluxation. There is some periosteal reaction mid shaft of proximal phalanx. Healing stress fracture cannot be excluded. Question nondisplaced fracture at the base of proximal phalanx third toe. Electronically Signed   By: Lahoma Crocker M.D.   On: 09/14/2015 13:36    EKG:   Orders placed or performed during the hospital encounter of 09/14/15  . ED EKG  . ED EKG  . ED EKG  . ED EKG    IMPRESSION AND PLAN:   82 year old African-American gentleman history  type 2 diabetes insulin requiring presenting with hypoglycemia  1. Hypoglycemia: Setting of type 2 diabetes insulin requiring, check hemoglobin A1c will hold diabetic agents for now place on sliding scale for coverage as required he'll likely optimization of his regiment and downgrading of control.  2. Toe ulceration: Consult podiatry already discussed by emergency department staff, received antibiotics does not appear to be overtly infected at this time  3. Essential hypertension Norvasc Coreg altace 4. Hyperlipidemia unspecified Lipitor   All the records are reviewed and case discussed with ED provider. Management plans discussed with the patient, family and they are in agreement.  CODE STfullOTAL TIME TAKING CARE OF THIS PATIENT: 33 minutes.    Raisha Brabender,  Karenann Cai.D on 09/14/2015 at 3:12 PM  Between 7am to 6pm - Pager - [phone]  After 6pm: House Pager: - [phone]  [facility]  Office  [phone]  CC: Primary care physician; Volanda Napoleon, MD

## 2015-09-14 NOTE — ED Notes (Signed)
Pt here from [facility] assisted [facility] ([address]) after syncopal episode Saturday. Pt denies any complaints at present, reports he hit his left 2nd toe last Tuesday and continues to have some pain. Pt's CBG 49 in ED, given orange juice.

## 2015-09-14 NOTE — ED Notes (Signed)
Blood Glucose = 54

## 2015-09-15 DIAGNOSIS — E1165 Type 2 diabetes mellitus with hyperglycemia: Secondary | ICD-10-CM | POA: Diagnosis not present

## 2015-09-15 LAB — CBC
HCT: 27.6 % — ABNORMAL LOW (ref 40.0–52.0)
Hemoglobin: 9.2 g/dL — ABNORMAL LOW (ref 13.0–18.0)
MCH: 28.1 pg (ref 26.0–34.0)
MCHC: 33.1 g/dL (ref 32.0–36.0)
MCV: 84.9 fL (ref 80.0–100.0)
PLATELETS: 205 10*3/uL (ref 150–440)
RBC: 3.25 MIL/uL — AB (ref 4.40–5.90)
RDW: 16.3 % — ABNORMAL HIGH (ref 11.5–14.5)
WBC: 4.8 10*3/uL (ref 3.8–10.6)

## 2015-09-15 LAB — HEMOGLOBIN A1C: HEMOGLOBIN A1C: 6.6 % — AB (ref 4.0–6.0)

## 2015-09-15 LAB — GLUCOSE, CAPILLARY
GLUCOSE-CAPILLARY: 139 mg/dL — AB (ref 65–99)
Glucose-Capillary: 126 mg/dL — ABNORMAL HIGH (ref 65–99)
Glucose-Capillary: 156 mg/dL — ABNORMAL HIGH (ref 65–99)
Glucose-Capillary: 149 mg/dL — ABNORMAL HIGH (ref 65–99)

## 2015-09-15 LAB — BASIC METABOLIC PANEL
Anion gap: 6 (ref 5–15)
BUN: 15 mg/dL (ref 6–20)
CALCIUM: 8.7 mg/dL — AB (ref 8.9–10.3)
CO2: 23 mmol/L (ref 22–32)
CREATININE: 0.92 mg/dL (ref 0.61–1.24)
Chloride: 104 mmol/L (ref 101–111)
GFR calc non Af Amer: >60 mL/min (ref >60)
Glucose, Bld: 143 mg/dL — ABNORMAL HIGH (ref 65–99)
Potassium: 4 mmol/L (ref 3.5–5.1)
SODIUM: 133 mmol/L — AB (ref 135–145)

## 2015-09-15 MED ORDER — OXYCODONE HCL 5 MG PO TABS: 5.0000 mg | ORAL_TABLET | ORAL | Status: DC | PRN

## 2015-09-15 MED ORDER — AMOXICILLIN-POT CLAVULANATE 875-125 MG PO TABS: 1.0000 | ORAL_TABLET | Freq: Two times a day (BID) | ORAL | Status: DC

## 2015-09-15 MED ORDER — METFORMIN HCL 500 MG PO TABS: 500.0000 mg | ORAL_TABLET | Freq: Two times a day (BID) | ORAL | Status: DC

## 2015-09-15 NOTE — Care Management Obs Status (Signed)
[facility] NOTIFICATION   Patient Details  Name: Philip Garrett MRN: [id number] Date of Birth: [birth year]   Medicare Observation Status Notification Given:  Yes Signed copy sent to him   Beverly Sessions, RN 09/15/2015, 3:00 PM

## 2015-09-15 NOTE — NC FL2 (Signed)
[place] LEVEL OF CARE SCREENING TOOL     IDENTIFICATION  Patient Name: Philip Garrett Birthdate: 02-19-1932 Sex: male Admission Date (Current Location): 09/14/2015  [facility] and [place] Number:  Engineering geologist and Address:  [facility], [address] [place], [place] [address]      Provider Number: [id number]  Attending Physician Name and Address:  Gladstone Lighter, MD  Relative Name and Phone Number:       Current Level of Care: Hospital Recommended Level of Care: [place] Prior Approval Number:    Date Approved/Denied:   PASRR Number:    Discharge Plan: Domiciliary (Rest home)    Current Diagnoses: Patient Active Problem List   Diagnosis Date Noted  . Hypoglycemia 09/14/2015    Orientation RESPIRATION BLADDER Height & Weight     Self, Time, Situation, Place  Normal Continent Weight: 250 lb (113.399 kg) Height:  6\' 5"  (195.6 cm)  BEHAVIORAL SYMPTOMS/MOOD NEUROLOGICAL BOWEL NUTRITION STATUS      Continent Diet (Heart Healthy)  AMBULATORY STATUS COMMUNICATION OF NEEDS Skin   Supervision Verbally Normal                       Personal Care Assistance Level of Assistance  Bathing, Feeding, Dressing Bathing Assistance: Limited assistance Feeding assistance: Independent Dressing Assistance: Limited assistance     Functional Limitations Info  Sight, Hearing, Speech Sight Info: Adequate Hearing Info: Adequate Speech Info: Adequate    SPECIAL CARE FACTORS FREQUENCY  PT (By licensed PT)     PT Frequency: HHPT              Contractures      Additional Factors Info  Code Status, Allergies Code Status Info: Full Code Allergies Info: No known allergies           Current Medications (09/15/2015):  This is the current hospital active medication list Current Facility-Administered Medications  Medication Dose Route Frequency Provider Last Rate Last Dose  . acetaminophen (TYLENOL) tablet  650 mg  650 mg Oral Q6H PRN Lytle Butte, MD       Or  . acetaminophen (TYLENOL) suppository 650 mg  650 mg Rectal Q6H PRN Lytle Butte, MD      . amLODipine (NORVASC) tablet 5 mg  5 mg Oral Daily Lytle Butte, MD   5 mg at 09/15/15 0902  . atorvastatin (LIPITOR) tablet 20 mg  20 mg Oral Daily Lytle Butte, MD   20 mg at 09/15/15 0902  . carvedilol (COREG) tablet 50 mg  50 mg Oral BID WC Lytle Butte, MD   50 mg at 09/15/15 0901  . clopidogrel (PLAVIX) tablet 75 mg  75 mg Oral Daily Lytle Butte, MD   75 mg at 09/15/15 0902  . fluticasone (FLONASE) 50 MCG/ACT nasal spray 1 spray  1 spray Each Nare Daily Lytle Butte, MD   1 spray at 09/15/15 0903  . heparin injection 5,000 Units  5,000 Units Subcutaneous 3 times per day Lytle Butte, MD   5,000 Units at 09/15/15 1339  . insulin aspart (novoLOG) injection 0-5 Units  0-5 Units Subcutaneous QHS Lytle Butte, MD   0 Units at 09/14/15 2149  . insulin aspart (novoLOG) injection 0-9 Units  0-9 Units Subcutaneous TID WC Lytle Butte, MD   1 Units at 09/15/15 1144  . loratadine (CLARITIN) tablet 10 mg  10 mg Oral Daily Lytle Butte,  MD   10 mg at 09/15/15 0902  . montelukast (SINGULAIR) tablet 10 mg  10 mg Oral Daily Lytle Butte, MD   10 mg at 09/15/15 0902  . morphine 2 MG/ML injection 2 mg  2 mg Intravenous Q4H PRN Lytle Butte, MD      . ondansetron [facility]) tablet 4 mg  4 mg Oral Q6H PRN Lytle Butte, MD       Or  . ondansetron [facility]) injection 4 mg  4 mg Intravenous Q6H PRN Lytle Butte, MD      . oxyCODONE (Oxy IR/ROXICODONE) immediate release tablet 5 mg  5 mg Oral Q4H PRN Lytle Butte, MD      . piperacillin-tazobactam (ZOSYN) IVPB 3.375 g  3.375 g Intravenous Once Eula Listen, MD   3.375 g at 09/14/15 1500  . piperacillin-tazobactam (ZOSYN) IVPB 3.375 g  3.375 g Intravenous 3 times per day Lytle Butte, MD   3.375 g at 09/15/15 1339  . ramipril (ALTACE) capsule 10 mg  10 mg Oral Daily Lytle Butte, MD   10 mg at  09/15/15 0902  . traMADol (ULTRAM) tablet 50 mg  50 mg Oral Q6H PRN Lytle Butte, MD         Discharge Medications: Please see discharge summary for a list of discharge medications.  Current Discharge Medication List    START taking these medications   Details  amoxicillin-clavulanate (AUGMENTIN) 875-125 MG tablet Take 1 tablet by mouth 2 (two) times daily. X 9 days Qty: 18 tablet, Refills: 0    oxyCODONE (OXY IR/ROXICODONE) 5 MG immediate release tablet Take 1 tablet (5 mg total) by mouth every 4 (four) hours as needed for moderate pain. Qty: 20 tablet, Refills: 0      CONTINUE these medications which have CHANGED   Details  metFORMIN (GLUCOPHAGE) 500 MG tablet Take 1 tablet (500 mg total) by mouth 2 (two) times daily with a meal. Start from 09/17/15 Qty: 60 tablet, Refills: 2      CONTINUE these medications which have NOT CHANGED   Details  acetaminophen (TYLENOL) 500 MG tablet Take 500 mg by mouth every 6 (six) hours as needed.    amLODipine (NORVASC) 5 MG tablet Take 5 mg by mouth daily.    atorvastatin (LIPITOR) 20 MG tablet Take 20 mg by mouth daily.    carvedilol (COREG) 25 MG tablet Take 50 mg by mouth 2 (two) times daily with a meal.    clopidogrel (PLAVIX) 75 MG tablet Take 75 mg by mouth daily.    fluticasone (FLONASE) 50 MCG/ACT nasal spray Place 1 spray into both nostrils daily.    insulin aspart (NOVOLOG) 100 UNIT/ML injection Inject 6 Units into the skin 2 (two) times daily as needed for high blood sugar (if blood sugar is higher than 450).    loratadine (CLARITIN) 10 MG tablet Take 10 mg by mouth daily.    montelukast (SINGULAIR) 10 MG tablet Take 10 mg by mouth daily.    ramipril (ALTACE) 10 MG capsule Take 10 mg by mouth daily.    Skin Protectants, Misc. (EUCERIN) cream Apply 1 application topically 2 (two) times daily.     traMADol (ULTRAM) 50 MG tablet Take 1 tablet (50 mg total) by mouth  every 6 (six) hours as needed for severe pain. Qty: 20 tablet, Refills: 0      STOP taking these medications     insulin detemir (LEVEMIR) 100 UNIT/ML injection  linagliptin (TRADJENTA) 5 MG TABS tablet            Relevant Imaging Results:  Relevant Lab Results:   Additional Information SSN:  [id number]  Pt will be followed by home health services through [facility], LCSW

## 2015-09-15 NOTE — Clinical Social Work Note (Signed)
Clinical Social Work Assessment  Patient Details  Name: Philip Garrett MRN: [id number] Date of Birth: Apr 25, 1932  Date of referral:  09/15/15               Reason for consult:  Facility Placement                Permission sought to share information with:  Family Supports Permission granted to share information::  Yes, Verbal Permission Granted  Name::     Roen Macgowan, sister  Pt does not have a guardian.    Housing/Transportation Living arrangements for the past 2 months:  [place] of Information:  Patient, Other (Comment Required) (sister) Patient Interpreter Needed:  None Criminal Activity/Legal Involvement Pertinent to Current Situation/Hospitalization:  No - Comment as needed Significant Relationships:  Siblings Lives with:  Facility Resident Do you feel safe going back to the place where you live?  Yes Need for family participation in patient care:  Yes (Comment)  Care giving concerns:  No care giving concerns identified.  Social Worker assessment / plan:  CSW met with pt to address consult. CSW introduced herself and explained role of social work. Pt is from [facility] and would like to return. PT is recommending HHPT. CSW spoke with pt's sister, who is in agreement as well.   Pt is ready for discharge today. Facility is able to accept him. RNCM arranged home health for pt through [facility]. Facility will be providing transportation. RN is aware. CSW is signing off as no further needs identified.   Employment status:  Retired Forensic scientist:  Information systems manager, Medicaid [place] PT Recommendations:  Home with [place] / Referral to community resources:  Other (Comment Required) ([place])  Patient/Family's Response to care:  Pt and sister appreciative of CSW support.   Patient/Family's Understanding of and Emotional Response to Diagnosis, Current Treatment, and Prognosis:  Pt understands that he will return to facility with home health  PT and wound care.   Emotional Assessment Appearance:  Appears stated age Attitude/Demeanor/Rapport:  Other (Appropriate) Affect (typically observed):  Accepting Orientation:  Oriented to Place, Oriented to  Time, Oriented to Situation, Oriented to Self Alcohol / Substance use:  Never Used Psych involvement (Current and /or in the community):   No  Discharge Needs  Concerns to be addressed:  No discharge needs identified Readmission within the last 30 days:  No Current discharge risk:  None Barriers to Discharge:  No Barriers Identified   Darden Dates, LCSW 09/15/2015, 4:40 PM

## 2015-09-15 NOTE — Progress Notes (Signed)
09/15/2015  BP 124/68 mmHg  Pulse 85  Temp(Src) 97.8 F (36.6 C) (Oral)  Resp 17  Ht 6\' 5"  (1.956 m)  Wt 113.399 kg (250 lb)  BMI 29.64 kg/m2  SpO2 98% Patient discharged per MD orders to [place]. Discharge instructions reviewed with patient and caregiver, both verbalized understanding. IV removed per policy. Discharged via wheelchair escorted by nursing staff.  Almedia Balls, RN

## 2015-09-15 NOTE — Discharge Summary (Signed)
[facility] [place] NAME: Philip Garrett    MR#:  [id number]  DATE OF BIRTH:  [birth year]  DATE OF ADMISSION:  09/14/2015 ADMITTING PHYSICIAN: Lytle Butte, MD  DATE OF DISCHARGE: 09/15/15  PRIMARY CARE PHYSICIAN: Volanda Napoleon, MD    ADMISSION DIAGNOSIS:  Hypoglycemia [E16.2] Chronic anemia [D64.9] Syncope, unspecified syncope type [R55] Subacute osteomyelitis of left foot ([place]) [id number]  DISCHARGE DIAGNOSIS:  Active Problems:   Hypoglycemia   SECONDARY DIAGNOSIS:   Past Medical History  Diagnosis Date  . Diabetes mellitus without complication ([place])   . Hypertension   . CHF (congestive heart failure) [facility])     HOSPITAL COURSE:   82 year old male with past medical history significant for type 2 diabetes mellitus on insulin, hypertension and arthritis presents from assisted living facility secondary to near syncopal episode and hyperglycemia.  #1 hypoglycemia-patient has had prior hypoglycemic episodes according to him that insulin had to be held at the assisted living facility. -Continue to hold his Lantus at this time. HbA1c is at 6.6. -Decreased metformin dose. Continue to hold Tradjenta -Continue sliding scale insulin and frequent fingersticks. -Initially needed D5 drip that has been turned off this morning. Sugars still remain greater than 120. -Follow up with PCP in one week  #2 near syncope-secondary to hypoglycemia. Improved now.  #3 left foot third toe ulceration from trauma- started after trauma due to diabetic neuropathy. -Appreciate podiatry consult. No evidence of osteomyelitis. Patient did receive vancomycin and Zosyn in the hospital. -Recommended oral antibiotics and daily dressing changes with Bactroban ointment and light gauze bandage.  #4 hypertension-continue home medications. On Norvasc, Coreg and Altace  #5 asthma/COPD-continue inhalers. Stable and no active wheezing.  Patient worked with  physical therapy. He will be discharged back to assisted living facility.   DISCHARGE CONDITIONS:   Guarded  CONSULTS OBTAINED:  Treatment Team:  Lytle Butte, MD Sharlotte Alamo, DPM  DRUG ALLERGIES:  No Known Allergies  DISCHARGE MEDICATIONS:   Current Discharge Medication List    START taking these medications   Details  amoxicillin-clavulanate (AUGMENTIN) 875-125 MG tablet Take 1 tablet by mouth 2 (two) times daily. X 9 days Qty: 18 tablet, Refills: 0    oxyCODONE (OXY IR/ROXICODONE) 5 MG immediate release tablet Take 1 tablet (5 mg total) by mouth every 4 (four) hours as needed for moderate pain. Qty: 20 tablet, Refills: 0      CONTINUE these medications which have CHANGED   Details  metFORMIN (GLUCOPHAGE) 500 MG tablet Take 1 tablet (500 mg total) by mouth 2 (two) times daily with a meal. Start from 09/17/15 Qty: 60 tablet, Refills: 2      CONTINUE these medications which have NOT CHANGED   Details  acetaminophen (TYLENOL) 500 MG tablet Take 500 mg by mouth every 6 (six) hours as needed.    amLODipine (NORVASC) 5 MG tablet Take 5 mg by mouth daily.    atorvastatin (LIPITOR) 20 MG tablet Take 20 mg by mouth daily.    carvedilol (COREG) 25 MG tablet Take 50 mg by mouth 2 (two) times daily with a meal.    clopidogrel (PLAVIX) 75 MG tablet Take 75 mg by mouth daily.    fluticasone (FLONASE) 50 MCG/ACT nasal spray Place 1 spray into both nostrils daily.    insulin aspart (NOVOLOG) 100 UNIT/ML injection Inject 6 Units into the skin 2 (two) times daily as needed for high blood sugar (if blood sugar is higher than  450).    loratadine (CLARITIN) 10 MG tablet Take 10 mg by mouth daily.    montelukast (SINGULAIR) 10 MG tablet Take 10 mg by mouth daily.    ramipril (ALTACE) 10 MG capsule Take 10 mg by mouth daily.    Skin Protectants, Misc. (EUCERIN) cream Apply 1 application topically 2 (two) times daily.     traMADol (ULTRAM) 50 MG tablet Take 1 tablet (50 mg total)  by mouth every 6 (six) hours as needed for severe pain. Qty: 20 tablet, Refills: 0      STOP taking these medications     insulin detemir (LEVEMIR) 100 UNIT/ML injection      linagliptin (TRADJENTA) 5 MG TABS tablet          DISCHARGE INSTRUCTIONS:   1. Hold insulin, check blood sugars frequently  If you experience worsening of your admission symptoms, develop shortness of breath, life threatening emergency, suicidal or homicidal thoughts you must seek medical attention immediately by calling 911 or calling your MD immediately  if symptoms less severe.  You Must read complete instructions/literature along with all the possible adverse reactions/side effects for all the Medicines you take and that have been prescribed to you. Take any new Medicines after you have completely understood and accept all the possible adverse reactions/side effects.   Please note  You were cared for by a hospitalist during your hospital stay. If you have any questions about your discharge medications or the care you received while you were in the hospital after you are discharged, you can call the unit and asked to speak with the hospitalist on call if the hospitalist that took care of you is not available. Once you are discharged, your primary care physician will handle any further medical issues. Please note that NO REFILLS for any discharge medications will be authorized once you are discharged, as it is imperative that you return to your primary care physician (or establish a relationship with a primary care physician if you do not have one) for your aftercare needs so that they can reassess your need for medications and monitor your lab values.    Today   CHIEF COMPLAINT:   Chief Complaint  Patient presents with  . Near Syncope    VITAL SIGNS:  Blood pressure 124/68, pulse 85, temperature 97.8 F (36.6 C), temperature source Oral, resp. rate 17, height 6\' 5"  (1.956 m), weight 113.399 kg (250 lb),  SpO2 98 %.  I/O:   Intake/Output Summary (Last 24 hours) at 09/15/15 1447 Last data filed at 09/15/15 0900  Gross per 24 hour  Intake   2001 ml  Output    850 ml  Net   1151 ml    PHYSICAL EXAMINATION:   Physical Exam  GENERAL:  82 y.o.-year-old patient lying in the bed with no acute distress.  EYES: Pupils equal, round, reactive to light and accommodation. No scleral icterus. Extraocular muscles intact.  HEENT: Head atraumatic, normocephalic. Oropharynx and nasopharynx clear.  NECK:  Supple, no jugular venous distention. No thyroid enlargement, no tenderness.  LUNGS: Normal breath sounds bilaterally, no wheezing, rales,rhonchi or crepitation. No use of accessory muscles of respiration.  CARDIOVASCULAR: S1, S2 normal. No rubs, or gallops. 2/6 systolic murmur present ABDOMEN: Soft, non-tender, non-distended. Bowel sounds present. No organomegaly or mass.  EXTREMITIES: No pedal edema, cyanosis, or clubbing. Left foot second toe ulceration. Dressing in place. NEUROLOGIC: Cranial nerves II through XII are intact. Muscle strength 5/5 in all extremities. Sensation intact. Gait not checked.  PSYCHIATRIC: The patient is alert and oriented x 2-3.  SKIN: No obvious rash, lesion, or ulcer.   DATA REVIEW:   CBC  Recent Labs Lab 09/15/15 0347  WBC 4.8  HGB 9.2*  HCT 27.6*  PLT 205    Chemistries   Recent Labs Lab 09/15/15 0347  NA 133*  K 4.0  CL 104  CO2 23  GLUCOSE 143*  BUN 15  CREATININE 0.92  CALCIUM 8.7*    Cardiac Enzymes  Recent Labs Lab 09/14/15 1147  [place] <0.03    Microbiology Results  No results found for this or any previous visit.  RADIOLOGY:  Dg Toe 2nd Left  09/14/2015  CLINICAL DATA:  Syncope, hit second toe last Tuesday EXAM: LEFT SECOND TOE COMPARISON:  None. FINDINGS: Three views of the left second toe submitted. There is diffuse soft tissue swelling second toe. No displaced fracture or subluxation. There is some periosteal reaction mid  shaft of proximal phalanx. Healing stress fracture cannot be excluded. Question nondisplaced fracture at the base of proximal phalanx third toe. IMPRESSION: There is diffuse soft tissue swelling second toe. No displaced fracture or subluxation. There is some periosteal reaction mid shaft of proximal phalanx. Healing stress fracture cannot be excluded. Question nondisplaced fracture at the base of proximal phalanx third toe. Electronically Signed   By: Lahoma Crocker M.D.   On: 09/14/2015 13:36    EKG:   Orders placed or performed during the hospital encounter of 09/14/15  . ED EKG  . ED EKG  . ED EKG  . ED EKG      Management plans discussed with the patient, family and they are in agreement.  CODE STATUS:     Code Status Orders        Start     Ordered   09/14/15 1502  Full code   Continuous     09/14/15 1502    Code Status History    Date Active Date Inactive Code Status Order ID Comments User Context   This patient has a current code status but no historical code status.      TOTAL TIME TAKING CARE OF THIS PATIENT: 37 minutes.    Gladstone Lighter M.D on 09/15/2015 at 2:47 PM  Between 7am to 6pm - Pager - [phone]  After 6pm go to [email or web address] - password [email or web address]  Office  [phone]  CC: Primary care physician; Volanda Napoleon, MD

## 2015-09-15 NOTE — Evaluation (Signed)
Physical Therapy Evaluation Patient Details Name: Philip Garrett MRN: [id number] DOB: 20-Feb-1932 Today's Date: 09/15/2015   History of Present Illness  Philip Garrett is a 82yo black male who comes to [facility] after subacute L toe 2 pain after blunt trauma about 1 week ago. Pt found to be hypoglycemic upon arrival. Pt reports 3-4 falls in the last several weeks, uncertain of etiology, but says he just blacks out sometimes.   Clinical Impression  Pt demonstrating all mobility near baseline, however with impaired strength (5x STS in 13.81s c valgus bilat), and balance impairment (BBT 37/56, gait speed <0.39ms). Pt is safe to return to home with continues RW use ad lib, however, pt should work with PT to address these impairments, and to reduce risk of falls at home. No additional PT services needed at this time. PT signing off.     Follow Up Recommendations Home health PT ([facility] )    Equipment Recommendations       Recommendations for Other Services       Precautions / Restrictions Precautions Precautions: Fall      Mobility  Bed Mobility Overal bed mobility: Independent                Transfers Overall transfer level: Modified independent                  Ambulation/Gait Ambulation/Gait assistance: Supervision Ambulation Distance (Feet): 180 Feet Assistive device: None   Gait velocity: 0.54m,  Gait velocity interpretation: <1.8 ft/sec, indicative of risk for recurrent falls General Gait Details: LLE in abduction.   Stairs            Wheelchair Mobility    Modified Rankin (Stroke Patients Only)       Balance                                 Standardized Balance Assessment Standardized Balance Assessment : Berg Balance Test Berg Balance Test Sit to Stand: Able to stand without using hands and stabilize independently Standing Unsupported: Able to stand safely 2 minutes Sitting with Back Unsupported but  Feet Supported on Floor or Stool: Able to sit safely and securely 2 minutes Stand to Sit: Controls descent by using hands Transfers: Able to transfer safely, minor use of hands Standing Unsupported with Eyes Closed: Able to stand 10 seconds safely Standing Ubsupported with Feet Together: Able to place feet together independently but unable to hold for 30 seconds From Standing, Reach Forward with Outstretched Arm: Can reach forward >12 cm safely (5") From Standing Position, Pick up Object from Floor: Able to pick up shoe, needs supervision From Standing Position, Turn to Look Behind Over each Shoulder: Looks behind one side only/other side shows less weight shift Turn 360 Degrees: Able to turn 360 degrees safely but slowly Standing Unsupported, Alternately Place Feet on Step/Stool: Needs assistance to keep from falling or unable to try Standing Unsupported, One Foot in Front: Loses balance while stepping or standing Standing on One Leg: Tries to lift leg/unable to hold 3 seconds but remains standing independently Total Score: 37         Pertinent Vitals/Pain Pain Assessment: No/denies pain    Home Living Family/patient expects to be discharged to:: Assisted living               Home Equipment: Walker - 2 wheels      Prior Function Level  of Independence: Needs assistance   Gait / Transfers Assistance Needed: RW for nightly AMB during BR trips           Hand Dominance        Extremity/Trunk Assessment   Upper Extremity Assessment: Overall WFL for tasks assessed           Lower Extremity Assessment: Overall WFL for tasks assessed;Generalized weakness (5x STS 13.81s, no use of BUE. )      Cervical / Trunk Assessment:  (cervical kyphosis.Marland Kitchen)  Communication   Communication: No difficulties  Cognition Arousal/Alertness: Awake/alert Behavior During Therapy: WFL for tasks assessed/performed Overall Cognitive Status: Within Functional Limits for tasks assessed                       General Comments      Exercises        Assessment/Plan    PT Assessment All further PT needs can be met in the next venue of care  PT Diagnosis Abnormality of gait;Generalized weakness   PT Problem List    PT Treatment Interventions     PT Goals (Current goals can be found in the Care Plan section) Acute Rehab PT Goals PT Goal Formulation: All assessment and education complete, DC therapy    Frequency     Barriers to discharge        Co-evaluation               End of Session Equipment Utilized During Treatment: Gait belt Activity Tolerance: Patient tolerated treatment well;No increased pain;Patient limited by fatigue Patient left: in bed;with call bell/phone within reach;with bed alarm set Nurse Communication: Mobility status;Weight bearing status    Functional Assessment Tool Used: BERG BALANCE  Functional Limitation: Mobility: Walking and moving around Mobility: Walking and Moving Around Current Status (I3382): At least 20 percent but less than 40 percent impaired, limited or restricted Mobility: Walking and Moving Around Goal Status [phone]): At least 20 percent but less than 40 percent impaired, limited or restricted Mobility: Walking and Moving Around Discharge Status [phone]): At least 20 percent but less than 40 percent impaired, limited or restricted    Time: 1420-1439 PT Time Calculation (min) (ACUTE ONLY): 19 min   Charges:   PT Evaluation $PT Eval Low Complexity: 1 Procedure PT Treatments $Therapeutic Activity: 8-22 mins   PT G Codes:   PT G-Codes **NOT FOR INPATIENT CLASS** Functional Assessment Tool Used: BERG BALANCE  Functional Limitation: Mobility: Walking and moving around Mobility: Walking and Moving Around Current Status (L9379): At least 20 percent but less than 40 percent impaired, limited or restricted Mobility: Walking and Moving Around Goal Status [phone]): At least 20 percent but less than 40 percent  impaired, limited or restricted Mobility: Walking and Moving Around Discharge Status [phone]): At least 20 percent but less than 40 percent impaired, limited or restricted   2:58 PM, 09/15/2015 Etta Grandchild, PT, DPT PRN Physical Therapist - [place] License # [id number] [phone] (mobile)

## 2015-09-15 NOTE — Care Management (Signed)
Patient to return to [facility].  Home health PT and RN has been ordered.  Agency preference was offered.  No preference indicated.  Referral made to [facility].  Abby with [facility] notified.  RNCM signing off

## 2015-10-28 ENCOUNTER — Encounter
Admission: RE | Admit: 2015-10-28 | Discharge: 2015-10-28 | Disposition: A | Discharge status: Home / Self Care | Payer: Medicare Other | Source: Ambulatory Visit | Attending: Podiatry | Admitting: Podiatry

## 2015-10-28 HISTORY — DX: Atherosclerotic heart disease of native coronary artery without angina pectoris: I25.10

## 2015-10-28 HISTORY — DX: Hyperlipidemia, unspecified: E78.5

## 2015-10-28 HISTORY — DX: Cerebral infarction, unspecified: I63.9

## 2015-10-28 HISTORY — DX: Acute myocardial infarction, unspecified: I21.9

## 2015-10-28 NOTE — Patient Instructions (Signed)
Your procedure is scheduled on: Friday 10/27/15 Report to Day Surgery. MEDICAL [facility]ALL ENTRANCE To find out your arrival time please call [phone] between 1PM - 3PM on Thursday 10/29/15.  Remember: Instructions that are not followed completely may result in serious medical risk, up to and including death, or upon the discretion of your surgeon and anesthesiologist your surgery may need to be rescheduled.    __X__ 1. Do not eat food or drink liquids after midnight. No gum chewing or hard candies.     __X__ 2. No Alcohol for 24 hours before or after surgery.   ____ 3. Bring all medications with you on the day of surgery if instructed.    __X__ 4. Notify your doctor if there is any change in your medical condition     (cold, fever, infections).     Do not wear jewelry, make-up, hairpins, clips or nail polish.  Do not wear lotions, powders, or perfumes.   Do not shave 48 hours prior to surgery. Men may shave face and neck.  Do not bring valuables to the hospital.    [facility] is not responsible for any belongings or valuables.               Contacts, dentures or bridgework may not be worn into surgery.  Leave your suitcase in the car. After surgery it may be brought to your room.  For patients admitted to the hospital, discharge time is determined by your                treatment team.   Patients discharged the day of surgery will not be allowed to drive home.   Please read over the following fact sheets that you were given:   Surgical Site Infection Prevention   __X__ Take these medicines the morning of surgery with A SIP OF WATER:    1. AMLODIPINE  2. ATORVASTATIN  3. CARVEDILOL  4. LORATADINE  5. MONTELUKAST  6. RAMIPRIL  7. MAY TAKE PAIN MEDICATION IF NEEDED  ____ Fleet Enema (as directed)   __X__ Use CHG Soap as directed  ____ Use inhalers on the day of surgery  __X__ Stop metformin 2 days prior to surgery  STOP TODAY  __X__ Take 1/2 of usual insulin dose the night  before surgery and none on the morning of surgery.   __X__ Stop Coumadin/Plavix/aspirin on ALREADY ON HOLD  ____ Stop Anti-inflammatories on    ____ Stop supplements until after surgery.    ____ Bring C-Pap to the hospital.

## 2015-10-30 ENCOUNTER — Ambulatory Visit: Payer: Medicare Other | Admitting: Certified Registered Nurse Anesthetist

## 2015-10-30 ENCOUNTER — Ambulatory Visit
Admission: RE | Admit: 2015-10-30 | Discharge: 2015-10-30 | Disposition: A | Discharge status: Home / Self Care | Payer: Medicare Other | Source: Ambulatory Visit | Attending: Podiatry | Admitting: Podiatry

## 2015-10-30 ENCOUNTER — Encounter
Admission: RE | Disposition: A | Discharge status: Home / Self Care | Payer: Self-pay | Source: Ambulatory Visit | Attending: Podiatry

## 2015-10-30 ENCOUNTER — Encounter: Payer: Self-pay | Admitting: *Deleted

## 2015-10-30 DIAGNOSIS — L97524 Non-pressure chronic ulcer of other part of left foot with necrosis of bone: Secondary | ICD-10-CM | POA: Insufficient documentation

## 2015-10-30 DIAGNOSIS — M869 Osteomyelitis, unspecified: Secondary | ICD-10-CM | POA: Diagnosis not present

## 2015-10-30 HISTORY — PX: AMPUTATION TOE: SHX6595

## 2015-10-30 LAB — GLUCOSE, CAPILLARY
GLUCOSE-CAPILLARY: 267 mg/dL — AB (ref 65–99)
Glucose-Capillary: 336 mg/dL — ABNORMAL HIGH (ref 65–99)

## 2015-10-30 SURGERY — AMPUTATION, TOE
Anesthesia: General | Laterality: Left

## 2015-10-30 MED ORDER — SODIUM CHLORIDE 0.9 % IV SOLN
INTRAVENOUS | Status: DC
  Administered 2015-10-30: 07:00:00 via INTRAVENOUS

## 2015-10-30 MED ORDER — FENTANYL CITRATE (PF) 100 MCG/2ML IJ SOLN
INTRAMUSCULAR | Status: DC | PRN
  Administered 2015-10-30: 50 ug via INTRAVENOUS

## 2015-10-30 MED ORDER — ONDANSETRON HCL 4 MG/2ML IJ SOLN
INTRAMUSCULAR | Status: DC | PRN
  Administered 2015-10-30: 4 mg via INTRAVENOUS

## 2015-10-30 MED ORDER — FAMOTIDINE 20 MG PO TABS
ORAL_TABLET | ORAL | Status: AC
  Filled 2015-10-30: qty 1

## 2015-10-30 MED ORDER — NEOMYCIN-POLYMYXIN B GU 40-200000 IR SOLN
Status: DC | PRN
  Administered 2015-10-30: 2 mL

## 2015-10-30 MED ORDER — LIDOCAINE HCL (PF) 1 % IJ SOLN
INTRAMUSCULAR | Status: AC
  Filled 2015-10-30: qty 30

## 2015-10-30 MED ORDER — NEOMYCIN-POLYMYXIN B GU 40-200000 IR SOLN
Status: AC
  Filled 2015-10-30: qty 2

## 2015-10-30 MED ORDER — PROPOFOL 10 MG/ML IV BOLUS
INTRAVENOUS | Status: DC | PRN
  Administered 2015-10-30 (×3): 20 mg via INTRAVENOUS

## 2015-10-30 MED ORDER — BUPIVACAINE HCL (PF) 0.5 % IJ SOLN
INTRAMUSCULAR | Status: AC
  Filled 2015-10-30: qty 30

## 2015-10-30 MED ORDER — FENTANYL CITRATE (PF) 100 MCG/2ML IJ SOLN: 25.0000 ug | INTRAMUSCULAR | Status: DC | PRN

## 2015-10-30 MED ORDER — CEFAZOLIN SODIUM-DEXTROSE 2-4 GM/100ML-% IV SOLN
INTRAVENOUS | Status: AC
  Filled 2015-10-30: qty 100

## 2015-10-30 MED ORDER — OXYCODONE HCL 5 MG/5ML PO SOLN: 5.0000 mg | Freq: Once | ORAL | Status: DC | PRN

## 2015-10-30 MED ORDER — OXYCODONE HCL 5 MG PO TABS: 5.0000 mg | ORAL_TABLET | Freq: Once | ORAL | Status: DC | PRN

## 2015-10-30 MED ORDER — FAMOTIDINE 20 MG PO TABS
20.0000 mg | ORAL_TABLET | Freq: Once | ORAL | Status: AC
  Administered 2015-10-30: 20 mg via ORAL

## 2015-10-30 MED ORDER — HYDROCODONE-ACETAMINOPHEN 5-325 MG PO TABS
1.0000 | ORAL_TABLET | ORAL | Status: DC | PRN
Start: 2015-10-30 — End: 2016-01-18

## 2015-10-30 MED ORDER — INSULIN ASPART 100 UNIT/ML ~~LOC~~ SOLN
SUBCUTANEOUS | Status: AC
  Filled 2015-10-30: qty 10

## 2015-10-30 MED ORDER — PROPOFOL 500 MG/50ML IV EMUL
INTRAVENOUS | Status: DC | PRN
  Administered 2015-10-30: 75 ug/kg/min via INTRAVENOUS

## 2015-10-30 MED ORDER — CEFAZOLIN SODIUM-DEXTROSE 2-4 GM/100ML-% IV SOLN
2.0000 g | Freq: Once | INTRAVENOUS | Status: AC
  Administered 2015-10-30: 2 g via INTRAVENOUS

## 2015-10-30 MED ORDER — INSULIN ASPART 100 UNIT/ML ~~LOC~~ SOLN
10.0000 [IU] | Freq: Once | SUBCUTANEOUS | Status: AC
Start: 2015-10-30 — End: 2015-10-30
  Administered 2015-10-30: 10 [IU] via SUBCUTANEOUS

## 2015-10-30 MED ORDER — MIDAZOLAM HCL 2 MG/2ML IJ SOLN
INTRAMUSCULAR | Status: DC | PRN
  Administered 2015-10-30: 1 mg via INTRAVENOUS

## 2015-10-30 SURGICAL SUPPLY — 49 items
BANDAGE ACE 4X5 VEL STRL LF (GAUZE/BANDAGES/DRESSINGS) ×3 IMPLANT
BANDAGE STRETCH 3X4.1 STRL (GAUZE/BANDAGES/DRESSINGS) ×3 IMPLANT
BLADE MED AGGRESSIVE (BLADE) IMPLANT
BLADE OSC/SAGITTAL MD 5.5X18 (BLADE) IMPLANT
BLADE SURG 15 STRL LF DISP TIS (BLADE) ×2 IMPLANT
BLADE SURG 15 STRL SS (BLADE) ×4
BLADE SURG MINI STRL (BLADE) ×3 IMPLANT
BNDG ESMARK 4X12 TAN STRL LF (GAUZE/BANDAGES/DRESSINGS) ×3 IMPLANT
BNDG GAUZE 4.5X4.1 6PLY STRL (MISCELLANEOUS) ×3 IMPLANT
CANISTER SUCT 1200ML W/VALVE (MISCELLANEOUS) ×3 IMPLANT
CLOSURE WOUND 1/4X4 (GAUZE/BANDAGES/DRESSINGS) ×1
CNTNR SPEC 2.5X3XGRAD LEK (MISCELLANEOUS) ×1
CONT SPEC 4OZ STER OR WHT (MISCELLANEOUS) ×2
CONTAINER SPEC 2.5X3XGRAD LEK (MISCELLANEOUS) ×1 IMPLANT
CUFF TOURN 18 STER (MISCELLANEOUS) ×3 IMPLANT
CUFF TOURN DUAL PL 12 NO SLV (MISCELLANEOUS) IMPLANT
DRAPE FLUOR MINI C-ARM 54X84 (DRAPES) IMPLANT
DURAPREP 26ML APPLICATOR (WOUND CARE) ×3 IMPLANT
ELECT REM PT RETURN 9FT ADLT (ELECTROSURGICAL) ×3
ELECTRODE REM PT RTRN 9FT ADLT (ELECTROSURGICAL) ×1 IMPLANT
GAUZE PETRO XEROFOAM 1X8 (MISCELLANEOUS) ×3 IMPLANT
GAUZE SPONGE 4X4 12PLY STRL (GAUZE/BANDAGES/DRESSINGS) ×3 IMPLANT
GAUZE STRETCH 2X75IN STRL (MISCELLANEOUS) IMPLANT
GAUZE XEROFORM 4X4 STRL (GAUZE/BANDAGES/DRESSINGS) ×3 IMPLANT
GLOVE BIO SURGEON STRL SZ7.5 (GLOVE) ×3 IMPLANT
GLOVE INDICATOR 8.0 STRL GRN (GLOVE) ×3 IMPLANT
GOWN STRL REUS W/ TWL LRG LVL3 (GOWN DISPOSABLE) ×2 IMPLANT
GOWN STRL REUS W/TWL LRG LVL3 (GOWN DISPOSABLE) ×4
HANDPIECE VERSAJET DEBRIDEMENT (MISCELLANEOUS) IMPLANT
KIT RM TURNOVER STRD PROC AR (KITS) ×3 IMPLANT
LABEL OR SOLS (LABEL) ×3 IMPLANT
NEEDLE FILTER BLUNT 18X 1/2SAF (NEEDLE)
NEEDLE FILTER BLUNT 18X1 1/2 (NEEDLE) IMPLANT
NEEDLE HYPO 25X1 1.5 SAFETY (NEEDLE) ×6 IMPLANT
NS IRRIG 500ML POUR BTL (IV SOLUTION) ×3 IMPLANT
PACK EXTREMITY ARMC (MISCELLANEOUS) ×3 IMPLANT
SOL .9 NS 3000ML IRR  AL (IV SOLUTION) ×2
SOL .9 NS 3000ML IRR UROMATIC (IV SOLUTION) ×1 IMPLANT
SOL PREP PVP 2OZ (MISCELLANEOUS) ×3
SOLUTION PREP PVP 2OZ (MISCELLANEOUS) ×1 IMPLANT
STOCKINETTE STRL 6IN 960660 (GAUZE/BANDAGES/DRESSINGS) ×3 IMPLANT
STRIP CLOSURE SKIN 1/4X4 (GAUZE/BANDAGES/DRESSINGS) ×2 IMPLANT
SUT ETHILON 4-0 (SUTURE) ×4
SUT ETHILON 4-0 FS2 18XMFL BLK (SUTURE) ×2
SUT ETHILON 5-0 FS-2 18 BLK (SUTURE) ×3 IMPLANT
SUTURE ETHLN 4-0 FS2 18XMF BLK (SUTURE) ×2 IMPLANT
SWAB DUAL CULTURE TRANS RED ST (MISCELLANEOUS) ×3 IMPLANT
SYR 3ML LL SCALE MARK (SYRINGE) ×3 IMPLANT
SYRINGE 10CC LL (SYRINGE) IMPLANT

## 2015-10-30 NOTE — Interval H&P Note (Signed)
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 After consideration of risks, benefits and other options for treatment, the patient has consented to  Procedure(s): AMPUTATION TOE/mpj left 2nd (Left) as a surgical intervention .  The patient's history has been reviewed, patient examined, no change in status, stable for surgery.  I have reviewed the patient's chart and labs.  Questions were answered to the patient's satisfaction.     Durward Fortes

## 2015-10-30 NOTE — Anesthesia Preprocedure Evaluation (Signed)
Anesthesia Evaluation  Patient identified by MRN, date of birth, ID band Patient awake    Reviewed: Allergy & Precautions, H&P , NPO status , Patient's Chart, lab work & pertinent test results  History of Anesthesia Complications Negative for: history of anesthetic complications  Airway Mallampati: III  TM Distance: >3 FB Neck ROM: limited    Dental  (+) Poor Dentition, Missing   Pulmonary neg pulmonary ROS, neg shortness of breath,    Pulmonary exam normal breath sounds clear to auscultation       Cardiovascular Exercise Tolerance: Poor hypertension, (-) angina+ CAD, + Past MI, +CHF and + DOE  Normal cardiovascular exam Rhythm:regular Rate:Normal     Neuro/Psych CVA, Residual Symptoms negative psych ROS   GI/Hepatic negative GI ROS, Neg liver ROS,   Endo/Other  diabetes, Poorly Controlled, Type 2, Insulin Dependent  Renal/GU negative Renal ROS  negative genitourinary   Musculoskeletal   Abdominal   Peds  Hematology negative hematology ROS (+)   Anesthesia Other Findings Past Medical History:   Diabetes mellitus without complication (HCC)                 Hypertension                                                 CHF (congestive heart failure) (HCC)                         Hyperlipidemia                                               Coronary artery disease                                      Myocardial infarction ([place])                                  Stroke ([place])                                                   Comment:TIA  Past Surgical History:   TOE AMPUTATION                                  Left                Comment:great toe   CORONARY ANGIOPLASTY                                         BMI    Body Mass Index   29.63 kg/m 2      Reproductive/Obstetrics negative OB ROS  Anesthesia Physical Anesthesia Plan  ASA: IV  Anesthesia Plan:  General and MAC   Post-op Pain Management:    Induction:   Airway Management Planned:   Additional Equipment:   Intra-op Plan:   Post-operative Plan:   Informed Consent: I have reviewed the patients History and Physical, chart, labs and discussed the procedure including the risks, benefits and alternatives for the proposed anesthesia with the patient or authorized representative who has indicated his/her understanding and acceptance.   Dental Advisory Given  Plan Discussed with: Anesthesiologist, CRNA and Surgeon  Anesthesia Plan Comments:         Anesthesia Quick Evaluation

## 2015-10-30 NOTE — Anesthesia Procedure Notes (Signed)
Date/Time: 10/30/2015 7:22 AM Performed by: Johnna Acosta Pre-anesthesia Checklist: Patient identified, Emergency Drugs available, Suction available, Patient being monitored and Timeout performed Patient Re-evaluated:Patient Re-evaluated prior to inductionOxygen Delivery Method: Simple face mask

## 2015-10-30 NOTE — Progress Notes (Signed)
Postop shoe taken to PACU as instructed by Dr. Jens Som 10 units given Novolog as per anesthesia orders

## 2015-10-30 NOTE — Anesthesia Postprocedure Evaluation (Signed)
Anesthesia Post Note  Patient: Philip Garrett  Procedure(s) Performed: Procedure(s) (LRB): AMPUTATION LEFT SECOND TOE/mpj left 2nd (Left)  Patient location during evaluation: PACU Anesthesia Type: General Level of consciousness: awake and alert Pain management: pain level controlled Vital Signs Assessment: post-procedure vital signs reviewed and stable Respiratory status: spontaneous breathing, nonlabored ventilation, respiratory function stable and patient connected to nasal cannula oxygen Cardiovascular status: blood pressure returned to baseline and stable Postop Assessment: no signs of nausea or vomiting Anesthetic complications: no    Last Vitals:  Filed Vitals:   10/30/15 0913 10/30/15 0922  BP: 123/56 109/66  Pulse: 89 102  Temp:    Resp: 18 17    Last Pain:  Filed Vitals:   10/30/15 0927  PainSc: 0-No pain                 Precious Haws Addalyne Vandehei

## 2015-10-30 NOTE — Progress Notes (Signed)
abx Abx sent to OR with patient Postop shoe sent to OR with patient meds reviewed with [facility] rep Lora and e-mar from [employer]

## 2015-10-30 NOTE — H&P (Signed)
  History and physical in the chart was reviewed. No interval changes. Patient stable for surgery 

## 2015-10-30 NOTE — Transfer of Care (Signed)
Immediate Anesthesia Transfer of Care Note  Patient: Philip Garrett  Procedure(s) Performed: Procedure(s): AMPUTATION LEFT SECOND TOE/mpj left 2nd (Left)  Patient Location: PACU  Anesthesia Type:General  Level of Consciousness: sedated  Airway & Oxygen Therapy: Patient Spontanous Breathing and Patient connected to face mask oxygen  Post-op Assessment: Report given to RN and Post -op Vital signs reviewed and stable  Post vital signs: Reviewed and stable  Last Vitals:  Filed Vitals:   10/30/15 0602  BP: 127/61  Pulse: 70  Temp: 36.7 C  Resp: 18    Last Pain: There were no vitals filed for this visit.       Complications: No apparent anesthesia complications

## 2015-10-30 NOTE — Discharge Instructions (Addendum)
1. Elevate left lower extremity.  2. Keep the bandage on the left foot clean, dry, and do not remove.  3. Sponge bathe only left lower extremity.  4. Wear surgical shoe whenever walking or standing on the left foot.  5. Take one pain pill, Norco, every 4 hours if needed for pain.  AMBULATORY SURGERY  DISCHARGE INSTRUCTIONS   1) The drugs that you were given will stay in your system until tomorrow so for the next 24 hours you should not:  A) Drive an automobile B) Make any legal decisions C) Drink any alcoholic beverage   2) You may resume regular meals tomorrow.  Today it is better to start with liquids and gradually work up to solid foods.  You may eat anything you prefer, but it is better to start with liquids, then soup and crackers, and gradually work up to solid foods.   3) Please notify your doctor immediately if you have any unusual bleeding, trouble breathing, redness and pain at the surgery site, drainage, fever, or pain not relieved by medication.    4) Additional Instructions:        Please contact your physician with any problems or Same Day Surgery at [phone], Monday through Friday 6 am to 4 pm, or [place] [facility] number at [phone].

## 2015-10-30 NOTE — Op Note (Signed)
Date of operation: 10/30/2015.  Surgeon: Durward Fortes DPM.  Preoperative diagnosis: Osteomyelitis left second toe.  Postoperative diagnosis: Same.  Her seizure: Amputation left second toe.  Anesthesia: Local Mac.  Hemostasis: Pneumatic tourniquet left ankle 250 mmHg.  Estimated blood loss: Less than 5 cc.  Pathology: Left second toe.  Cultures: Bone culture proximal phalanx head left second toe.  Complications: None apparent.  Operative indications: This is an 82 year old male with chronic history of an ulceration on his left second toe. Progressed down to the level of radiographic evidence for osteomyelitis and decision was made for amputation of the second toe.  Operative procedure: Patient was taken the operating room and placed on the table in the supine position. Following satisfactory sedation the left forefoot was anesthetized with 10 cc of 0.5% Sensorcaine plain around the second metatarsal. A pneumatic tourniquet was applied at the level of the left ankle and the foot was prepped and draped in the usual sterile fashion. Foot was exsanguinated and the tourniquet inflated to 250 mmHg. Attention was then directed to the distal aspect of the left forefoot where an elliptical incision was made from dorsal to plantar around the medial and lateral base of the second toe. Incision was carried sharply down to the level of the bone where periosteal dissection carried back to the level of the metatarsophalangeal joint where the toe was completely disarticulated and removed in toto. Good healthy tissues were noted to remain. The wound was then flushed with copious amounts of sterile saline and closed using 4-0 nylon vertical mattress and simple interrupted sutures. Xeroform and a sterile bandage applied. Tourniquet released. Patient tolerated the procedure and anesthesia well and was transported to the PACU with vital signs stable and in good condition.

## 2015-11-03 LAB — ANAEROBIC CULTURE

## 2015-11-03 LAB — TISSUE CULTURE: Culture: NO GROWTH

## 2015-11-03 LAB — SURGICAL PATHOLOGY

## 2016-01-18 ENCOUNTER — Encounter: Payer: Self-pay | Admitting: Emergency Medicine

## 2016-01-18 ENCOUNTER — Emergency Department: Payer: Medicare Other

## 2016-01-18 ENCOUNTER — Observation Stay
Admission: EM | Admit: 2016-01-18 | Discharge: 2016-01-21 | Payer: Medicare Other | Attending: Surgery | Admitting: Surgery

## 2016-01-18 DIAGNOSIS — I5022 Chronic systolic (congestive) heart failure: Secondary | ICD-10-CM | POA: Insufficient documentation

## 2016-01-18 DIAGNOSIS — E11649 Type 2 diabetes mellitus with hypoglycemia without coma: Secondary | ICD-10-CM | POA: Insufficient documentation

## 2016-01-18 DIAGNOSIS — K5669 Other intestinal obstruction: Secondary | ICD-10-CM

## 2016-01-18 DIAGNOSIS — F17290 Nicotine dependence, other tobacco product, uncomplicated: Secondary | ICD-10-CM | POA: Diagnosis not present

## 2016-01-18 DIAGNOSIS — Z8711 Personal history of peptic ulcer disease: Secondary | ICD-10-CM | POA: Diagnosis not present

## 2016-01-18 DIAGNOSIS — N200 Calculus of kidney: Secondary | ICD-10-CM | POA: Diagnosis not present

## 2016-01-18 DIAGNOSIS — E785 Hyperlipidemia, unspecified: Secondary | ICD-10-CM | POA: Diagnosis not present

## 2016-01-18 DIAGNOSIS — I2510 Atherosclerotic heart disease of native coronary artery without angina pectoris: Secondary | ICD-10-CM | POA: Insufficient documentation

## 2016-01-18 DIAGNOSIS — I110 Hypertensive heart disease with heart failure: Secondary | ICD-10-CM | POA: Diagnosis not present

## 2016-01-18 DIAGNOSIS — N400 Enlarged prostate without lower urinary tract symptoms: Secondary | ICD-10-CM | POA: Insufficient documentation

## 2016-01-18 DIAGNOSIS — K567 Ileus, unspecified: Secondary | ICD-10-CM | POA: Diagnosis not present

## 2016-01-18 DIAGNOSIS — I252 Old myocardial infarction: Secondary | ICD-10-CM | POA: Diagnosis not present

## 2016-01-18 DIAGNOSIS — Z89412 Acquired absence of left great toe: Secondary | ICD-10-CM | POA: Insufficient documentation

## 2016-01-18 DIAGNOSIS — Z89422 Acquired absence of other left toe(s): Secondary | ICD-10-CM | POA: Diagnosis not present

## 2016-01-18 DIAGNOSIS — Z79899 Other long term (current) drug therapy: Secondary | ICD-10-CM | POA: Insufficient documentation

## 2016-01-18 DIAGNOSIS — R188 Other ascites: Secondary | ICD-10-CM | POA: Insufficient documentation

## 2016-01-18 DIAGNOSIS — N390 Urinary tract infection, site not specified: Secondary | ICD-10-CM | POA: Diagnosis present

## 2016-01-18 DIAGNOSIS — N281 Cyst of kidney, acquired: Secondary | ICD-10-CM | POA: Diagnosis not present

## 2016-01-18 DIAGNOSIS — Z794 Long term (current) use of insulin: Secondary | ICD-10-CM | POA: Diagnosis not present

## 2016-01-18 DIAGNOSIS — Z8673 Personal history of transient ischemic attack (TIA), and cerebral infarction without residual deficits: Secondary | ICD-10-CM | POA: Insufficient documentation

## 2016-01-18 DIAGNOSIS — K56609 Unspecified intestinal obstruction, unspecified as to partial versus complete obstruction: Secondary | ICD-10-CM

## 2016-01-18 LAB — COMPREHENSIVE METABOLIC PANEL
ALBUMIN: 4.1 g/dL (ref 3.5–5.0)
ALT: 12 U/L — ABNORMAL LOW (ref 17–63)
AST: 18 U/L (ref 15–41)
Alkaline Phosphatase: 77 U/L (ref 38–126)
Anion gap: 8 (ref 5–15)
BUN: 14 mg/dL (ref 6–20)
CHLORIDE: 102 mmol/L (ref 101–111)
CO2: 25 mmol/L (ref 22–32)
Calcium: 9.6 mg/dL (ref 8.9–10.3)
Creatinine, Ser: 0.91 mg/dL (ref 0.61–1.24)
GFR calc Af Amer: >60 mL/min (ref >60)
Glucose, Bld: 277 mg/dL — ABNORMAL HIGH (ref 65–99)
POTASSIUM: 4.2 mmol/L (ref 3.5–5.1)
Sodium: 135 mmol/L (ref 135–145)
Total Bilirubin: 0.7 mg/dL (ref 0.3–1.2)
Total Protein: 7.8 g/dL (ref 6.5–8.1)

## 2016-01-18 LAB — URINALYSIS COMPLETE WITH MICROSCOPIC (ARMC ONLY)
BACTERIA UA: NONE SEEN
BILIRUBIN URINE: NEGATIVE
Glucose, UA: >500 mg/dL — AB
HGB URINE DIPSTICK: NEGATIVE
NITRITE: NEGATIVE
PH: 5 (ref 5.0–8.0)
Protein, ur: NEGATIVE mg/dL
SPECIFIC GRAVITY, URINE: 1.028 (ref 1.005–1.030)
Squamous Epithelial / LPF: NONE SEEN

## 2016-01-18 LAB — CBC WITH DIFFERENTIAL/PLATELET
BASOS ABS: 0.1 10*3/uL (ref 0–0.1)
BASOS PCT: 1 %
EOS ABS: 0.1 10*3/uL (ref 0–0.7)
EOS PCT: 1 %
HCT: 33.1 % — ABNORMAL LOW (ref 40.0–52.0)
Hemoglobin: 11.1 g/dL — ABNORMAL LOW (ref 13.0–18.0)
Lymphocytes Relative: 8 %
Lymphs Abs: 0.8 10*3/uL — ABNORMAL LOW (ref 1.0–3.6)
MCH: 27.2 pg (ref 26.0–34.0)
MCHC: 33.5 g/dL (ref 32.0–36.0)
MCV: 81.1 fL (ref 80.0–100.0)
MONO ABS: 0.4 10*3/uL (ref 0.2–1.0)
Monocytes Relative: 5 %
Neutro Abs: 7.9 10*3/uL — ABNORMAL HIGH (ref 1.4–6.5)
Neutrophils Relative %: 85 %
PLATELETS: 208 10*3/uL (ref 150–440)
RBC: 4.09 MIL/uL — ABNORMAL LOW (ref 4.40–5.90)
RDW: 16.6 % — AB (ref 11.5–14.5)
WBC: 9.3 10*3/uL (ref 3.8–10.6)

## 2016-01-18 LAB — LIPASE, BLOOD: LIPASE: 15 U/L (ref 11–51)

## 2016-01-18 LAB — TROPONIN I: Troponin I: <0.03 ng/mL (ref <0.03)

## 2016-01-18 LAB — MRSA PCR SCREENING: MRSA BY PCR: POSITIVE — AB

## 2016-01-18 LAB — GLUCOSE, CAPILLARY
Glucose-Capillary: 199 mg/dL — ABNORMAL HIGH (ref 65–99)
Glucose-Capillary: 146 mg/dL — ABNORMAL HIGH (ref 65–99)

## 2016-01-18 MED ORDER — CEFTRIAXONE SODIUM 1 G IJ SOLR
1.0000 g | Freq: Once | INTRAMUSCULAR | Status: AC
  Administered 2016-01-18: 1 g via INTRAVENOUS
  Filled 2016-01-18: qty 10

## 2016-01-18 MED ORDER — CHLORHEXIDINE GLUCONATE CLOTH 2 % EX PADS
6.0000 | MEDICATED_PAD | Freq: Every day | CUTANEOUS | Status: DC
  Administered 2016-01-19 – 2016-01-21 (×3): 6 via TOPICAL

## 2016-01-18 MED ORDER — DIATRIZOATE MEGLUMINE & SODIUM 66-10 % PO SOLN
15.0000 mL | Freq: Once | ORAL | Status: AC
  Administered 2016-01-18: 15 mL via ORAL

## 2016-01-18 MED ORDER — INSULIN ASPART 100 UNIT/ML ~~LOC~~ SOLN
0.0000 [IU] | Freq: Three times a day (TID) | SUBCUTANEOUS | Status: DC
  Administered 2016-01-18: 2 [IU] via SUBCUTANEOUS
  Administered 2016-01-19: 1 [IU] via SUBCUTANEOUS
  Administered 2016-01-19: 5 [IU] via SUBCUTANEOUS
  Administered 2016-01-20: 3 [IU] via SUBCUTANEOUS
  Administered 2016-01-20 – 2016-01-21 (×3): 2 [IU] via SUBCUTANEOUS
  Filled 2016-01-18: qty 2
  Filled 2016-01-18: qty 3
  Filled 2016-01-18: qty 2
  Filled 2016-01-18: qty 5
  Filled 2016-01-18: qty 1
  Filled 2016-01-18 (×2): qty 2

## 2016-01-18 MED ORDER — IOPAMIDOL (ISOVUE-300) INJECTION 61%
100.0000 mL | Freq: Once | INTRAVENOUS | Status: AC | PRN
  Administered 2016-01-18: 100 mL via INTRAVENOUS

## 2016-01-18 MED ORDER — ACETAMINOPHEN 500 MG PO TABS: 500.0000 mg | ORAL_TABLET | Freq: Four times a day (QID) | ORAL | Status: DC | PRN

## 2016-01-18 MED ORDER — ONDANSETRON HCL 4 MG/2ML IJ SOLN
4.0000 mg | Freq: Once | INTRAMUSCULAR | Status: AC
  Administered 2016-01-18: 4 mg via INTRAVENOUS
  Filled 2016-01-18: qty 2

## 2016-01-18 MED ORDER — MONTELUKAST SODIUM 10 MG PO TABS: 10.0000 mg | ORAL_TABLET | ORAL | Status: DC

## 2016-01-18 MED ORDER — MUPIROCIN 2 % EX OINT
1.0000 "application " | TOPICAL_OINTMENT | Freq: Two times a day (BID) | CUTANEOUS | Status: DC
  Administered 2016-01-18 – 2016-01-21 (×5): 1 via NASAL
  Filled 2016-01-18: qty 22

## 2016-01-18 MED ORDER — ENOXAPARIN SODIUM 40 MG/0.4ML ~~LOC~~ SOLN
40.0000 mg | SUBCUTANEOUS | Status: DC
  Administered 2016-01-18 – 2016-01-20 (×3): 40 mg via SUBCUTANEOUS
  Filled 2016-01-18 (×3): qty 0.4

## 2016-01-18 MED ORDER — ONDANSETRON HCL 4 MG/2ML IJ SOLN: 4.0000 mg | Freq: Four times a day (QID) | INTRAMUSCULAR | Status: DC | PRN

## 2016-01-18 MED ORDER — AMLODIPINE BESYLATE 5 MG PO TABS
10.0000 mg | ORAL_TABLET | Freq: Every day | ORAL | Status: DC
  Administered 2016-01-19 – 2016-01-21 (×3): 10 mg via ORAL
  Filled 2016-01-18 (×4): qty 2

## 2016-01-18 MED ORDER — RAMIPRIL 10 MG PO CAPS
10.0000 mg | ORAL_CAPSULE | Freq: Every day | ORAL | Status: DC
  Administered 2016-01-19 – 2016-01-21 (×3): 10 mg via ORAL
  Filled 2016-01-18 (×4): qty 1

## 2016-01-18 MED ORDER — ATORVASTATIN CALCIUM 20 MG PO TABS
20.0000 mg | ORAL_TABLET | Freq: Every day | ORAL | Status: DC
  Administered 2016-01-18 – 2016-01-21 (×4): 20 mg via ORAL
  Filled 2016-01-18 (×4): qty 1

## 2016-01-18 MED ORDER — FAMOTIDINE IN NACL 20-0.9 MG/50ML-% IV SOLN
20.0000 mg | Freq: Two times a day (BID) | INTRAVENOUS | Status: DC
  Administered 2016-01-18 – 2016-01-20 (×4): 20 mg via INTRAVENOUS
  Filled 2016-01-18 (×5): qty 50

## 2016-01-18 MED ORDER — CARVEDILOL 25 MG PO TABS
50.0000 mg | ORAL_TABLET | Freq: Two times a day (BID) | ORAL | Status: DC
  Administered 2016-01-19 – 2016-01-21 (×5): 50 mg via ORAL
  Filled 2016-01-18 (×6): qty 2

## 2016-01-18 MED ORDER — MORPHINE SULFATE (PF) 4 MG/ML IV SOLN: 4.0000 mg | INTRAVENOUS | Status: DC | PRN

## 2016-01-18 MED ORDER — POTASSIUM CHLORIDE IN NACL 20-0.45 MEQ/L-% IV SOLN
INTRAVENOUS | Status: DC
  Administered 2016-01-18 – 2016-01-20 (×4): via INTRAVENOUS
  Filled 2016-01-18 (×5): qty 1000

## 2016-01-18 MED ORDER — ONDANSETRON 4 MG PO TBDP: 4.0000 mg | ORAL_TABLET | Freq: Four times a day (QID) | ORAL | Status: DC | PRN

## 2016-01-18 MED ORDER — INSULIN ASPART 100 UNIT/ML ~~LOC~~ SOLN
0.0000 [IU] | Freq: Every day | SUBCUTANEOUS | Status: DC
  Administered 2016-01-20: 2 [IU] via SUBCUTANEOUS
  Filled 2016-01-18: qty 2

## 2016-01-18 MED ORDER — INSULIN GLARGINE 100 UNIT/ML ~~LOC~~ SOLN
5.0000 [IU] | Freq: Every day | SUBCUTANEOUS | Status: DC
  Administered 2016-01-18 – 2016-01-21 (×4): 5 [IU] via SUBCUTANEOUS
  Filled 2016-01-18 (×4): qty 0.05

## 2016-01-18 MED ORDER — LATANOPROST 0.005 % OP SOLN
1.0000 [drp] | Freq: Every day | OPHTHALMIC | Status: DC
  Administered 2016-01-18 – 2016-01-20 (×3): 1 [drp] via OPHTHALMIC
  Filled 2016-01-18: qty 2.5

## 2016-01-18 MED ORDER — MORPHINE SULFATE (PF) 4 MG/ML IV SOLN
4.0000 mg | Freq: Once | INTRAVENOUS | Status: AC
Start: 2016-01-18 — End: 2016-01-18
  Administered 2016-01-18: 4 mg via INTRAVENOUS
  Filled 2016-01-18: qty 1

## 2016-01-18 MED ORDER — SODIUM CHLORIDE 0.9 % IV BOLUS (SEPSIS)
500.0000 mL | Freq: Once | INTRAVENOUS | Status: AC
  Administered 2016-01-18: 500 mL via INTRAVENOUS

## 2016-01-18 MED ORDER — HYDROCODONE-ACETAMINOPHEN 5-325 MG PO TABS: 1.0000 | ORAL_TABLET | ORAL | Status: DC | PRN

## 2016-01-18 NOTE — ED Provider Notes (Signed)
[facility] Emergency Department Provider Note   ____________________________________________   First MD Initiated Contact with Patient 01/18/16 [phone]     (approximate)  I have reviewed the triage vital signs and the nursing notes.   HISTORY  Chief Complaint Abdominal Pain   HPI Philip Garrett is a 82 y.o. male with CHF, CAD as well as diabetes who is presenting to the emergency department with right-sided abdominal pain which he said started at 3 AM. He described the pain to the medics as a 4 out of 10. He says the pain is radiating from his upper abdomen to the lower abdomen and up into the lower part of his chest on the right side. He says he vomited one time. Denies any abnormal stools such as diarrhea or any blood in his diarrhea or vomit. He denies any drinking.     Past Medical History:  Diagnosis Date  . CHF (congestive heart failure) ([place])   . Coronary artery disease   . Diabetes mellitus without complication ([place])   . Hyperlipidemia   . Hypertension   . Myocardial infarction ([place])   . Stroke [facility])    TIA    Patient Active Problem List   Diagnosis Date Noted  . Hypoglycemia 09/14/2015    Past Surgical History:  Procedure Laterality Date  . AMPUTATION TOE Left 10/30/2015   Procedure: AMPUTATION LEFT SECOND TOE/mpj left 2nd;  Surgeon: Sharlotte Alamo, DPM;  Location: [facility] ORS;  Service: Podiatry;  Laterality: Left;  . CORONARY ANGIOPLASTY    . TOE AMPUTATION Left    great toe    Prior to Admission medications   Medication Sig Start Date End Date Taking? Authorizing Provider  acetaminophen (TYLENOL) 500 MG tablet Take 500 mg by mouth every 6 (six) hours as needed.    Historical Provider, MD  amLODipine (NORVASC) 5 MG tablet Take 5 mg by mouth daily.    Historical Provider, MD  atorvastatin (LIPITOR) 20 MG tablet Take 20 mg by mouth daily.    Historical Provider, MD  carvedilol (COREG) 25 MG tablet Take 50 mg by mouth 2 (two) times daily with  a meal.    Historical Provider, MD  clopidogrel (PLAVIX) 75 MG tablet Take 75 mg by mouth daily.    Historical Provider, MD  docusate sodium (COLACE) 100 MG capsule Take 100 mg by mouth 2 (two) times daily.    Historical Provider, MD  fluticasone (FLONASE) 50 MCG/ACT nasal spray Place 1 spray into both nostrils daily.    Historical Provider, MD  HYDROcodone-acetaminophen (NORCO) 5-325 MG tablet Take 1-2 tablets by mouth every 4 (four) hours as needed for moderate pain. 10/30/15   Sharlotte Alamo, DPM  insulin aspart (NOVOLOG) 100 UNIT/ML injection Inject 6 Units into the skin 2 (two) times daily as needed for high blood sugar (if blood sugar is higher than 450).    Historical Provider, MD  Insulin Detemir (LEVEMIR FLEXTOUCH) 100 UNIT/ML Pen Inject 15 Units into the skin daily at 10 pm.    Historical Provider, MD  loratadine (CLARITIN) 10 MG tablet Take 10 mg by mouth daily.    Historical Provider, MD  metFORMIN (GLUCOPHAGE) 500 MG tablet Take 1 tablet (500 mg total) by mouth 2 (two) times daily with a meal. Start from 09/17/15 Patient taking differently: Take 1,000 mg by mouth 2 (two) times daily with a meal. Start from 09/17/15 09/15/15   Gladstone Lighter, MD  montelukast (SINGULAIR) 10 MG tablet Take 10 mg by mouth daily.  Historical Provider, MD  mupirocin cream (BACTROBAN) 2 % Apply 1 application topically every evening.    Historical Provider, MD  oxyCODONE (OXY IR/ROXICODONE) 5 MG immediate release tablet Take 1 tablet (5 mg total) by mouth every 4 (four) hours as needed for moderate pain. 09/15/15   Gladstone Lighter, MD  ramipril (ALTACE) 10 MG capsule Take 10 mg by mouth daily.    Historical Provider, MD  saccharomyces boulardii (FLORASTOR) 250 MG capsule Take 250 mg by mouth at bedtime.    Historical Provider, MD  Skin Protectants, Misc. (EUCERIN) cream Apply 1 application topically 2 (two) times daily.     Historical Provider, MD  traMADol (ULTRAM) 50 MG tablet Take 1 tablet (50 mg total) by  mouth every 6 (six) hours as needed for severe pain. 07/24/15 07/23/16  Nance Pear, MD    Allergies Review of patient's allergies indicates no known allergies.  Family History  Problem Relation Age of Onset  . Diabetes Other     Social History Social History  Substance Use Topics  . Smoking status: Never Smoker  . Smokeless tobacco: Current User  . Alcohol use No    Review of Systems Constitutional: No fever/chills Eyes: No visual changes. ENT: No sore throat. Cardiovascular: As above Respiratory: Denies shortness of breath Gastrointestinal:   No diarrhea.  No constipation. Genitourinary: Negative for dysuria. Musculoskeletal: Negative for back pain. Skin: Negative for rash. Neurological: Negative for headaches, focal weakness or numbness.  10-point ROS otherwise negative.  ____________________________________________   PHYSICAL EXAM:  VITAL SIGNS: ED Triage Vitals  Enc Vitals Group     BP      Pulse      Resp      Temp      Temp src      SpO2      Weight      Height      Head Circumference      Peak Flow      Pain Score      Pain Loc      Pain Edu?      Excl. in [place]?     Constitutional: Alert and oriented. Well appearing and in no acute distress. Eyes: Conjunctivae are normal. PERRL. EOMI. Head: Atraumatic. Nose: No congestion/rhinnorhea. Mouth/Throat: Mucous membranes are moist.   Neck: No stridor.   Cardiovascular: Normal rate, regular rhythm. Grossly normal heart sounds.  Good peripheral circulation. Respiratory: Normal respiratory effort.  No retractions. Lungs CTAB. Gastrointestinal: Soft With mild diffuse tenderness to palpation. Negative Murphy sign. No distention.  No CVA tenderness. Musculoskeletal: No lower extremity tenderness nor edema.  No joint effusions. Neurologic:  Normal speech and language. No gross focal neurologic deficits are appreciated.  Skin:  Skin is warm, dry and intact. No rash noted. Psychiatric: Mood and affect are  normal. Speech and behavior are normal.  ____________________________________________   LABS (all labs ordered are listed, but only abnormal results are displayed)  Labs Reviewed  CBC WITH DIFFERENTIAL/PLATELET  COMPREHENSIVE METABOLIC PANEL  LIPASE, BLOOD  TROPONIN I  URINALYSIS COMPLETEWITH MICROSCOPIC ([facility] ONLY)   ____________________________________________  EKG  ED ECG REPORT I, Doran Stabler, the attending physician, personally viewed and interpreted this ECG.   Date: 01/18/2016  EKG Time: 717  Rate: 76  Rhythm: Normal sinus rhythm  Axis: Normal   Intervals: None  ST&T Change: Lateral and inferior Q waves. Q waves similar to previous EKGs on the record.  ____________________________________________  RADIOLOGY  CT Abdomen Pelvis W Contrast (Accession  [id number]) (Order [id number])  Imaging  Date: 01/18/2016 Department: [facility] EMERGENCY DEPARTMENT Released By/Authorizing: Orbie Pyo, MD (auto-released)  PACS Images   Show images for CT Abdomen Pelvis W Contrast  Study Result   CLINICAL DATA:  Sudden onset diffuse right abdominal pain with nausea  EXAM: CT ABDOMEN AND PELVIS WITH CONTRAST  TECHNIQUE: Multidetector CT imaging of the abdomen and pelvis was performed using the standard protocol following bolus administration of intravenous contrast.  CONTRAST:  130mL ISOVUE-300 IOPAMIDOL (ISOVUE-300) INJECTION 61%  COMPARISON:  05/07/2015  FINDINGS: Lower chest: 3 mm triangular subpleural nodule in the right middle lobe (series 4/ image 9). 2 mm nodule along the left fissure (series 4/image 8).  Three vessel coronary atherosclerosis.  Hepatobiliary: Liver is within normal limits.  Gallbladder is unremarkable. No intrahepatic or extrahepatic ductal dilatation.  Pancreas: Within normal limits.  Spleen: Within normal limits.  Adrenals/Urinary Tract: Adrenal glands are within normal  limits.  Small bilateral renal cysts, measuring up to 13 mm in the lateral left lower pole (series 2/ image 44).  Scattered nonobstructing left renal calculi measuring up to 4 mm (series 2/ image 45). 2 mm nonobstructing right lower pole renal calculus (series 2/ image 39).  No ureteral or bladder calculi.  No hydronephrosis.  Bladder is within normal limits.  Stomach/Bowel: Stomach is within normal limits.  Multiple dilated loops of small bowel throughout the central abdomen. Decompressed distal small bowel in the left mid abdomen (series 2/ image 57) leading to the terminal ileum. Tethering/ kinking small bowel loops in the central left abdominal mesentery (series 2/image 56). This appearance is compatible with mid/distal small bowel obstruction, likely on the basis of adhesions.  Vascular/Lymphatic: No evidence of abdominal aortic aneurysm.  Atherosclerotic calcifications of the abdominal aorta and branch vessels.  No suspicious abdominopelvic lymphadenopathy.  Reproductive: Prostatomegaly, with enlargement of the central gland which indents the base of the bladder.  Other: Mild perihepatic and right mid abdominal ascites (series 2/ image 42). Trace pelvic ascites (series 2/ image 84).  Mild mesenteric stranding along the right mid abdomen (series 2/ image 63).  No free air.  Musculoskeletal: Mild degenerative changes of the visualized thoracolumbar spine.  IMPRESSION: Findings compatible with small bowel obstruction.  Associated small volume abdominopelvic ascites.  No free air.  Small pulmonary nodules at the lung bases measuring 2-3 mm. No follow-up needed if patient is low-risk (and has no known or suspected primary neoplasm). Non-contrast chest CT can be considered in 12 months if patient is high-risk. This recommendation follows the consensus statement: Guidelines for Management of Incidental Pulmonary Nodules Detected on CT Images:From  the Fleischner Society 2017; published online before print (10.1148/radiol.SG:5268862).  Additional ancillary findings as above.   Electronically Signed   By: Julian Hy M.D.   On: 01/18/2016 09:09    ____________________________________________   PROCEDURES  Procedure(s) performed:   Procedures  Critical Care performed:   ____________________________________________   INITIAL IMPRESSION / ASSESSMENT AND PLAN / ED COURSE  Pertinent labs & imaging results that were available during my care of the patient were reviewed by me and considered in my medical decision making (see chart for details).  ----------------------------------------- 10:00 AM on 01/18/2016 -----------------------------------------  Patient resting comfortably at this time. I updated him about his CAT scan findings and the need for admission to the hospital. The case was also signed out to Dr. Pat Patrick of the surgical service will be down to see the patient. The patient is also aware that  he will require a nasogastric tube.  Clinical Course     ____________________________________________   FINAL CLINICAL IMPRESSION(S) / ED DIAGNOSES  Final diagnoses:  None      NEW MEDICATIONS STARTED DURING THIS VISIT:  New Prescriptions   No medications on file     Note:  This document was prepared using Dragon voice recognition software and may include unintentional dictation errors.    Orbie Pyo, MD 01/18/16 1001

## 2016-01-18 NOTE — Progress Notes (Signed)
Pt came to floor at 1445. VSS. Pt was oriented to room and safety plan. Phone at bedside and bed alarm is on. Yellow socks are on also.

## 2016-01-18 NOTE — Care Management Obs Status (Signed)
[place] NOTIFICATION   Patient Details  Name: Philip Garrett MRN: [id number] Date of Birth: 1931-11-22   Medicare Observation Status Notification Given:   Yes    Beau Fanny, RN 01/18/2016, 1:55 PM

## 2016-01-18 NOTE — Consult Note (Signed)
Medical Consultation  Philip Garrett [id number] DOB: 04-Mar-1932 DOA: 01/18/2016 PCP: Volanda Napoleon, MD   Requesting physician: Dr Pat Patrick Date of consultation: 01/18/2016 Reason for consultation: DM and medical issues   Impression/Recommendations 82 y/o male with systolic heart failure EF 40-45% by ECHO 2015 and diabetes admitted with abdominal pain thought to be due to partial small bowel obstruction.  1. Diabetes: Patient has already been evaluated by diabetes coordinator. I will add sliding scale insulin. If blood sugars are greater than 180 then we can order Levemir 17 units daily as per recommendations by diabetes coordinator, but for now I will start LEVEMIR 5 units since we do not have any blood sugars recorded.. Hemoglobin A1c will be checked. Agree with holding oral diabetic medications.  2. Chronic systolic heart failure ejection fraction of 40-45% by echocardiogram 2015, currently not in exacerbation: Continue Coreg and ACE inhibitor. Monitor for symptoms of fluid overload such as shortness of breath or increasing oxygen requirement.  3. Essential hypertension: Continue Coreg, Norvasc and Altase. Blood pressure acceptable.  4. Hyperlipidemia: Continue atorvastatin.  5. Partial small bowel obstruction versus small bowel section: Management as per surgery. 6. Smokeless tobacco: We encouraged the patient to stop all nicotine products. Patient counseled for 3 minutes. Chief Complaint: Abdominal pain  HPI:   82 year old male with a history of diabetes, chronic systolic heart failure ejection fraction of 40-45% who presents with abdominal pain. Patient reports midepigastric abdominal pain associated with nausea and vomiting over the past day. CT scan in the emergency room shows distended small bowel with possible small bowel obstruction versus partial small bowel obstruction.   Review of Systems  Constitutional: Negative for fever, chills weight loss HENT: Negative for  ear pain, nosebleeds, congestion, facial swelling, rhinorrhea, neck pain, neck stiffness and ear discharge.   Respiratory: Negative for cough, shortness of breath, wheezing  Cardiovascular: Negative for chest pain, palpitations and leg swelling.  Gastrointestinal: Negative for heartburn, positive abdominal pain, nausea, vomiting, no diarrhea or consitpation Genitourinary: Negative for dysuria, urgency, frequency, hematuria Musculoskeletal: Negative for back pain or joint pain Neurological: Negative for dizziness, seizures, syncope, focal weakness,  numbness and headaches.  Hematological: Does not bruise/bleed easily.  Psychiatric/Behavioral: Negative for hallucinations, confusion, dysphoric mood   Past Medical History:  Diagnosis Date  . CHF (congestive heart failure) ([place])   . Coronary artery disease   . Diabetes mellitus without complication ([place])   . Hyperlipidemia   . Hypertension   . Myocardial infarction ([place])   . Stroke [facility])    TIA   Past Surgical History:  Procedure Laterality Date  . AMPUTATION TOE Left 10/30/2015   Procedure: AMPUTATION LEFT SECOND TOE/mpj left 2nd;  Surgeon: Sharlotte [place], DPM;  Location: [facility] ORS;  Service: Podiatry;  Laterality: Left;  . CORONARY ANGIOPLASTY    . TOE AMPUTATION Left    great toe   Social History:  reports that he has never smoked. His smokeless tobacco use includes Snuff and Chew. He reports that he does not drink alcohol or use drugs.  No Known Allergies Family History  Problem Relation Age of Onset  . Diabetes Other     Prior to Admission medications   Medication Sig Start Date End Date Taking? Authorizing Provider  amLODipine (NORVASC) 10 MG tablet Take 10 mg by mouth daily.    Yes Historical Provider, MD  atorvastatin (LIPITOR) 20 MG tablet Take 20 mg by mouth daily.   Yes Historical Provider, MD  carvedilol (COREG) 25 MG tablet Take 50  mg by mouth 2 (two) times daily with a meal.   Yes Historical Provider, MD  clopidogrel  (PLAVIX) 75 MG tablet Take 75 mg by mouth daily.   Yes Historical Provider, MD  docusate sodium (COLACE) 100 MG capsule Take 100 mg by mouth 2 (two) times daily.   Yes Historical Provider, MD  fluticasone (FLONASE) 50 MCG/ACT nasal spray Place 1 spray into both nostrils daily.   Yes Historical Provider, MD  Insulin Detemir (LEVEMIR FLEXTOUCH) 100 UNIT/ML Pen Inject 35 Units into the skin every morning.    Yes Historical Provider, MD  latanoprost (XALATAN) 0.005 % ophthalmic solution Place 1 drop into both eyes at bedtime.   Yes Historical Provider, MD  linagliptin (TRADJENTA) 5 MG TABS tablet Take 5 mg by mouth daily.   Yes Historical Provider, MD  metFORMIN (GLUCOPHAGE) 500 MG tablet Take 1 tablet (500 mg total) by mouth 2 (two) times daily with a meal. Start from 09/17/15 Patient taking differently: Take 1,000 mg by mouth 2 (two) times daily with a meal. Start from 09/17/15 09/15/15  Yes Gladstone Lighter, MD  montelukast (SINGULAIR) 10 MG tablet Take 10 mg by mouth every other day. For 7 days, then dc 01/15/16 01/23/16 Yes Historical Provider, MD  ramipril (ALTACE) 10 MG capsule Take 10 mg by mouth daily.   Yes Historical Provider, MD  Skin Protectants, Misc. (EUCERIN) cream Apply 1 application topically 2 (two) times daily.    Yes Historical Provider, MD  acetaminophen (TYLENOL) 500 MG tablet Take 500 mg by mouth every 6 (six) hours as needed.    Historical Provider, MD  insulin aspart (NOVOLOG) 100 UNIT/ML injection Inject 6 Units into the skin 2 (two) times daily as needed for high blood sugar (if blood sugar is higher than 450).    Historical Provider, MD  loratadine (CLARITIN) 10 MG tablet Take 10 mg by mouth daily.    Historical Provider, MD    Physical Exam: Blood pressure 138/77, pulse 77, temperature 98.5 F (36.9 C), temperature source Oral, resp. rate 20, height 6\' 4"  (1.93 m), weight 113.4 kg (250 lb), SpO2 98 %. @VITALS2 @ [employer]   01/18/16 0721  Weight: 113.4 kg (250 lb)     Intake/Output Summary (Last 24 hours) at 01/18/16 1622 Last data filed at 01/18/16 1235  Gross per 24 hour  Intake                0 ml  Output               30 ml  Net              -30 ml     Constitutional: Appears well-developed and well-nourished. No distress. HENT: Normocephalic. Marland Kitchen Oropharynx is clear and moist.  Eyes: Conjunctivae and EOM are normal. PERRLA, no scleral icterus.  Neck: Normal ROM. Neck supple. No JVD. No tracheal deviation. CVS: RRR, S1/S2 +, no murmurs, no gallops, no carotid bruit.  Pulmonary: Effort and breath sounds normal, no stridor, rhonchi, wheezes, rales.  Abdominal: Soft. BS +,  ++ distension, tenderness, NO rebound or guarding.  Musculoskeletal: Normal range of motion. No edema and no tenderness.  Neuro: Alert. CN 2-12 grossly intact. No focal deficits. Skin: Skin is warm and dry. No rash noted. Psychiatric: Normal mood and affect.    Labs  Basic Metabolic Panel:  Recent Labs Lab 01/18/16 0737  NA 135  K 4.2  CL 102  CO2 25  GLUCOSE 277*  BUN 14  CREATININE 0.91  CALCIUM 9.6   Liver Function Tests:  Recent Labs Lab 01/18/16 0737  AST 18  ALT 12*  ALKPHOS 77  BILITOT 0.7  PROT 7.8  ALBUMIN 4.1    Recent Labs Lab 01/18/16 0737  LIPASE 15    CBC:  Recent Labs Lab 01/18/16 0737  WBC 9.3  NEUTROABS 7.9*  HGB 11.1*  HCT 33.1*  MCV 81.1  PLT 208   Cardiac Enzymes:  Recent Labs Lab 01/18/16 0737  TROPONINI <0.03   BNP: Invalid input(s): POCBNP CBG: No results for input(s): GLUCAP in the last 168 hours.  Radiological Exams: Ct Abdomen Pelvis W Contrast  Result Date: 01/18/2016 CLINICAL DATA:  Sudden onset diffuse right abdominal pain with nausea EXAM: CT ABDOMEN AND PELVIS WITH CONTRAST TECHNIQUE: Multidetector CT imaging of the abdomen and pelvis was performed using the standard protocol following bolus administration of intravenous contrast. CONTRAST:  179mL ISOVUE-300 IOPAMIDOL (ISOVUE-300) INJECTION  61% COMPARISON:  05/07/2015 FINDINGS: Lower chest: 3 mm triangular subpleural nodule in the right middle lobe (series 4/ image 9). 2 mm nodule along the left fissure (series 4/image 8). Three vessel coronary atherosclerosis. Hepatobiliary: Liver is within normal limits. Gallbladder is unremarkable. No intrahepatic or extrahepatic ductal dilatation. Pancreas: Within normal limits. Spleen: Within normal limits. Adrenals/Urinary Tract: Adrenal glands are within normal limits. Small bilateral renal cysts, measuring up to 13 mm in the lateral left lower pole (series 2/ image 44). Scattered nonobstructing left renal calculi measuring up to 4 mm (series 2/ image 45). 2 mm nonobstructing right lower pole renal calculus (series 2/ image 39). No ureteral or bladder calculi.  No hydronephrosis. Bladder is within normal limits. Stomach/Bowel: Stomach is within normal limits. Multiple dilated loops of small bowel throughout the central abdomen. Decompressed distal small bowel in the left mid abdomen (series 2/ image 57) leading to the terminal ileum. Tethering/ kinking small bowel loops in the central left abdominal mesentery (series 2/image 56). This appearance is compatible with mid/distal small bowel obstruction, likely on the basis of adhesions. Vascular/Lymphatic: No evidence of abdominal aortic aneurysm. Atherosclerotic calcifications of the abdominal aorta and branch vessels. No suspicious abdominopelvic lymphadenopathy. Reproductive: Prostatomegaly, with enlargement of the central gland which indents the base of the bladder. Other: Mild perihepatic and right mid abdominal ascites (series 2/ image 42). Trace pelvic ascites (series 2/ image 84). Mild mesenteric stranding along the right mid abdomen (series 2/ image 63). No free air. Musculoskeletal: Mild degenerative changes of the visualized thoracolumbar spine. IMPRESSION: Findings compatible with small bowel obstruction. Associated small volume abdominopelvic ascites.   No free air. Small pulmonary nodules at the lung bases measuring 2-3 mm. No follow-up needed if patient is low-risk (and has no known or suspected primary neoplasm). Non-contrast chest CT can be considered in 12 months if patient is high-risk. This recommendation follows the consensus statement: Guidelines for Management of Incidental Pulmonary Nodules Detected on CT Images:From the Fleischner Society 2017; published online before print (10.1148/radiol.IJ:2314499). Additional ancillary findings as above. Electronically Signed   By: Julian Hy M.D.   On: 01/18/2016 09:09    EKG: NSR HR 76 no ST elevation   Thank you for allowing me to participate in the care of your patient. We will continue to follow.   Note: This dictation was prepared with Dragon dictation along with smaller phrase technology. Any transcriptional errors that result from this process are unintentional.  Time spent: 45 minutes  Yaffa Seckman, MD

## 2016-01-18 NOTE — H&P (Signed)
Philip Garrett is a 82 y.o. male  24-hour history of abdominal pain.  HPI: He was in his usual state of compromise general health when he developed some significant upper abdominal discomfort. The pain was primarily midepigastric upper abdominal pain associated with several episodes of nausea and vomiting. He has been passing gas and had a bowel movement last evening. He does not have any history of previous abdominal symptoms such as disease. He denies any previous abdominal surgery. The pain has improved since he presented to the emergency room.  History of hepatitis yellow jaundice pancreatitis peptic ulcer disease gallbladder disease or diverticulitis. He does have multiple medical problems including congestive heart failure coronary artery disease and diabetes. He isn't a assisted-living facility but does not have a primary care doctor present time. He is an insulin-dependent diabetic. He denies any previous abdominal surgery has had multiple procedures on his left foot with 2 applications. He is been on Plavix since his previous stroke.  Workup in the emergency room revealed normal white blood cell count normal. CT scan demonstrated some distended small bowel with a possible tethering in the distal small bowel consistent with possible small bowel obstruction. Surgical service was consulted.  Past Medical History:  Diagnosis Date  . CHF (congestive heart failure) ([place])   . Coronary artery disease   . Diabetes mellitus without complication ([place])   . Hyperlipidemia   . Hypertension   . Myocardial infarction ([place])   . Stroke [facility])    TIA   Past Surgical History:  Procedure Laterality Date  . AMPUTATION TOE Left 10/30/2015   Procedure: AMPUTATION LEFT SECOND TOE/mpj left 2nd;  Surgeon: Sharlotte Alamo, DPM;  Location: [facility] ORS;  Service: Podiatry;  Laterality: Left;  . CORONARY ANGIOPLASTY    . TOE AMPUTATION Left    great toe   Social History   Social History  . Marital status: Single   Spouse name: N/A  . Number of children: N/A  . Years of education: N/A   Social History Main Topics  . Smoking status: Never Smoker  . Smokeless tobacco: Current User    Types: Snuff, Chew  . Alcohol use No  . Drug use: No  . Sexual activity: Not Asked   Other Topics Concern  . None   Social History Narrative  . None     Review of Systems  Constitutional: Positive for malaise/fatigue. Negative for chills, diaphoresis, fever and weight loss.  HENT: Negative.   Eyes: Negative.   Respiratory: Negative for cough, shortness of breath and wheezing.   Cardiovascular: Negative for chest pain and palpitations.  Gastrointestinal: Positive for abdominal pain, nausea and vomiting. Negative for constipation, diarrhea and heartburn.  Genitourinary: Negative.   Musculoskeletal: Negative.   Skin: Negative.   Neurological: Negative.   Psychiatric/Behavioral: Negative.      PHYSICAL EXAM: BP 125/73   Pulse 80   Temp 97.6 F (36.4 C) (Oral)   Resp (!) 22   Ht 6\' 4"  (1.93 m)   Wt 113.4 kg (250 lb)   SpO2 99%   BMI 30.43 kg/m   Physical Exam  Constitutional: He is oriented to person, place, and time. He appears well-developed and well-nourished. No distress.  HENT:  Head: Normocephalic and atraumatic.  Eyes: EOM are normal. Pupils are equal, round, and reactive to light.  Neck: Normal range of motion. Neck supple.  Cardiovascular: Normal rate and regular rhythm.   Pulmonary/Chest: Effort normal and breath sounds normal.  Abdominal: Soft. He exhibits no distension.  There is tenderness. There is no rebound and no guarding.  Musculoskeletal: Normal range of motion. He exhibits deformity. He exhibits no edema.  To missing toes on his left foot with well-healed incisions  Neurological: He is alert and oriented to person, place, and time.  Skin: Skin is warm and dry.   His abdomen is soft with good bowel sounds. Does not have tinkling bowel sounds or evidence of high-pitched bowel  sounds. Does not have any abdominal scars. He has some mild tenderness but no rebound or guarding. I cannot identify any hernias.  Impression/Plan: I independently reviewed his CT scan. He does have some small bowel size mismatch consistent with possible partial small bowel obstruction. Without previous abdominal surgery it is unclear with a possible etiology. His nasogastric tube came out while I was interviewing. He coughed and the tube was exposed. He has not put out anything. The present time he is pain and does not have any nausea. Been in the hospital keep him nothing by mouth and observe him over the next several hours. He continues to vomit or develops increased abdominal pain we will reinstitute nasogastric decompression. We will get internal medicine service to assist in management of his diabetes congestive heart failure. He is in agreement with this plan.   Dia Crawford III, MD  01/18/2016, 1:39 PM

## 2016-01-18 NOTE — Progress Notes (Addendum)
Inpatient Diabetes Program Recommendations  AACE/ADA: New Consensus Statement on Inpatient Glycemic Control (2015)  Target Ranges:  Prepandial:   less than 140 mg/dL      Peak postprandial:   less than 180 mg/dL (1-2 hours)      Critically ill patients:  140 - 180 mg/dL   Results for RASHID, KNOPP (MRN [id number]) as of 01/18/2016 15:26  Ref. Range 01/18/2016 07:37  Glucose Latest Ref Range: 65 - 99 mg/dL 277 (H)   Review of Glycemic Control  Diabetes history: DM2 Outpatient Diabetes medications: Levemir 35 units QAM, Tradjenta 5 mg daily, Metformin 1000 mg BID, Novolog 6 units BID (if glucose over 450 mg/dl) Current orders for Inpatient glycemic control: None at this time; Hospitalist consult ordered to assist with managing diabetes  Inpatient Diabetes Program Recommendations: Insulin - Basal: According to the chart, patient takes Levemir 35 units QAM and last received on 01/17/16. If glucose continues to be elevated over 180 mg/dl, please consider ordering at least Levemir 17 units daily (which is half of outpatient Levemir dose and also 113 kg x 0.15 units). Correction (SSI): While inpatient, please order CBGs with Novolog correction scale ACHS (or Q4H if NPO). HgbA1C: Please consider ordering an A1C to evaluate glycemic control over the past 2-3 months.  Thanks, Barnie Alderman, RN, MSN, CDE Diabetes Coordinator Inpatient Diabetes Program [phone] (Team Pager from [place] to [place]) [phone] ([facility] office) [phone] [facility] office) [phone] [facility] office)

## 2016-01-18 NOTE — ED Triage Notes (Signed)
Pt to ED from group home with abdominal pain that started at 0300 today.  Pt states "it came on all of the sudden".  Pt arrived by EMS, who administered 4mg  Zofran IV in the field for nausea. No vomiting.

## 2016-01-19 ENCOUNTER — Observation Stay: Payer: Medicare Other

## 2016-01-19 DIAGNOSIS — K567 Ileus, unspecified: Secondary | ICD-10-CM | POA: Diagnosis not present

## 2016-01-19 DIAGNOSIS — K5669 Other intestinal obstruction: Secondary | ICD-10-CM | POA: Diagnosis not present

## 2016-01-19 LAB — GLUCOSE, CAPILLARY
Glucose-Capillary: 108 mg/dL — ABNORMAL HIGH (ref 65–99)
Glucose-Capillary: 124 mg/dL — ABNORMAL HIGH (ref 65–99)
Glucose-Capillary: 268 mg/dL — ABNORMAL HIGH (ref 65–99)
Glucose-Capillary: 141 mg/dL — ABNORMAL HIGH (ref 65–99)

## 2016-01-19 LAB — BASIC METABOLIC PANEL
Anion gap: 7 (ref 5–15)
BUN: 13 mg/dL (ref 6–20)
CALCIUM: 8.5 mg/dL — AB (ref 8.9–10.3)
CO2: 25 mmol/L (ref 22–32)
CREATININE: 0.9 mg/dL (ref 0.61–1.24)
Chloride: 106 mmol/L (ref 101–111)
GFR calc Af Amer: >60 mL/min (ref >60)
Glucose, Bld: 112 mg/dL — ABNORMAL HIGH (ref 65–99)
POTASSIUM: 3.8 mmol/L (ref 3.5–5.1)
SODIUM: 138 mmol/L (ref 135–145)

## 2016-01-19 LAB — URINE CULTURE

## 2016-01-19 LAB — CBC
HCT: 30.1 % — ABNORMAL LOW (ref 40.0–52.0)
Hemoglobin: 10.1 g/dL — ABNORMAL LOW (ref 13.0–18.0)
MCH: 27.2 pg (ref 26.0–34.0)
MCHC: 33.5 g/dL (ref 32.0–36.0)
MCV: 81 fL (ref 80.0–100.0)
PLATELETS: 183 10*3/uL (ref 150–440)
RBC: 3.72 MIL/uL — ABNORMAL LOW (ref 4.40–5.90)
RDW: 17.2 % — AB (ref 11.5–14.5)
WBC: 6 10*3/uL (ref 3.8–10.6)

## 2016-01-19 LAB — HEMOGLOBIN A1C: HEMOGLOBIN A1C: 11.1 % — AB (ref 4.0–6.0)

## 2016-01-19 NOTE — Progress Notes (Signed)
Subjective:   He is complaining of no abdominal pain this morning. He is not nauseated. He does not have any significant complaints. He is passing no gas as yet and has not had a bowel movement. Plain films this morning are pending.  Vital signs in last 24 hours: Temp:  [97.9 F (36.6 C)-98.9 F (37.2 C)] 98.9 F (37.2 C) (08/15 0439) Pulse Rate:  [70-81] 71 (08/15 0439) Resp:  [16-22] 18 (08/15 0111) BP: (111-142)/(61-81) 127/61 (08/15 0439) SpO2:  [96 %-100 %] 100 % (08/15 0439) Last BM Date: 01/17/16  Intake/Output from previous day: 08/14 0701 - 08/15 0700 In: 747 [I.V.:697; IV Piggyback:50] Out: 480 [Urine:450; Emesis/NG output:30]  Exam:  His abdomen is soft with relatively good bowel sounds no abdominal tenderness no distention. He has no rebound or guarding. He's breathing comfortably with no wheezing or shortness of breath.  Lab Results:  CBC  Recent Labs  01/18/16 0737 01/19/16 0427  WBC 9.3 6.0  HGB 11.1* 10.1*  HCT 33.1* 30.1*  PLT 208 183   CMP     Component Value Date/Time   NA 138 01/19/2016 0427   NA 138 04/14/2014 0641   K 3.8 01/19/2016 0427   K 4.5 04/14/2014 0641   CL 106 01/19/2016 0427   CL 105 04/14/2014 0641   CO2 25 01/19/2016 0427   CO2 23 04/14/2014 0641   GLUCOSE 112 (H) 01/19/2016 0427   GLUCOSE 100 (H) 04/14/2014 0641   BUN 13 01/19/2016 0427   BUN 18 04/14/2014 0641   CREATININE 0.90 01/19/2016 0427   CREATININE 1.11 04/14/2014 0641   CALCIUM 8.5 (L) 01/19/2016 0427   CALCIUM 9.1 04/14/2014 0641   PROT 7.8 01/18/2016 0737   PROT 7.9 04/14/2014 0641   ALBUMIN 4.1 01/18/2016 0737   ALBUMIN 3.9 04/14/2014 0641   AST 18 01/18/2016 0737   AST 27 04/14/2014 0641   ALT 12 (L) 01/18/2016 0737   ALT 22 04/14/2014 0641   ALKPHOS 77 01/18/2016 0737   ALKPHOS 63 04/14/2014 0641   BILITOT 0.7 01/18/2016 0737   BILITOT 0.3 04/14/2014 0641   GFRNONAA >60 01/19/2016 0427   GFRNONAA >60 04/14/2014 0641   GFRNONAA >60 08/11/2013 0515    GFRAA >60 01/19/2016 0427   GFRAA >60 04/14/2014 0641   GFRAA >60 08/11/2013 0515   PT/INR No results for input(s): LABPROT, INR in the last 72 hours.  Studies/Results: Ct Abdomen Pelvis W Contrast  Result Date: 01/18/2016 CLINICAL DATA:  Sudden onset diffuse right abdominal pain with nausea EXAM: CT ABDOMEN AND PELVIS WITH CONTRAST TECHNIQUE: Multidetector CT imaging of the abdomen and pelvis was performed using the standard protocol following bolus administration of intravenous contrast. CONTRAST:  153mL ISOVUE-300 IOPAMIDOL (ISOVUE-300) INJECTION 61% COMPARISON:  05/07/2015 FINDINGS: Lower chest: 3 mm triangular subpleural nodule in the right middle lobe (series 4/ image 9). 2 mm nodule along the left fissure (series 4/image 8). Three vessel coronary atherosclerosis. Hepatobiliary: Liver is within normal limits. Gallbladder is unremarkable. No intrahepatic or extrahepatic ductal dilatation. Pancreas: Within normal limits. Spleen: Within normal limits. Adrenals/Urinary Tract: Adrenal glands are within normal limits. Small bilateral renal cysts, measuring up to 13 mm in the lateral left lower pole (series 2/ image 44). Scattered nonobstructing left renal calculi measuring up to 4 mm (series 2/ image 45). 2 mm nonobstructing right lower pole renal calculus (series 2/ image 39). No ureteral or bladder calculi.  No hydronephrosis. Bladder is within normal limits. Stomach/Bowel: Stomach is within normal limits. Multiple dilated  loops of small bowel throughout the central abdomen. Decompressed distal small bowel in the left mid abdomen (series 2/ image 57) leading to the terminal ileum. Tethering/ kinking small bowel loops in the central left abdominal mesentery (series 2/image 56). This appearance is compatible with mid/distal small bowel obstruction, likely on the basis of adhesions. Vascular/Lymphatic: No evidence of abdominal aortic aneurysm. Atherosclerotic calcifications of the abdominal aorta and branch  vessels. No suspicious abdominopelvic lymphadenopathy. Reproductive: Prostatomegaly, with enlargement of the central gland which indents the base of the bladder. Other: Mild perihepatic and right mid abdominal ascites (series 2/ image 42). Trace pelvic ascites (series 2/ image 84). Mild mesenteric stranding along the right mid abdomen (series 2/ image 63). No free air. Musculoskeletal: Mild degenerative changes of the visualized thoracolumbar spine. IMPRESSION: Findings compatible with small bowel obstruction. Associated small volume abdominopelvic ascites.  No free air. Small pulmonary nodules at the lung bases measuring 2-3 mm. No follow-up needed if patient is low-risk (and has no known or suspected primary neoplasm). Non-contrast chest CT can be considered in 12 months if patient is high-risk. This recommendation follows the consensus statement: Guidelines for Management of Incidental Pulmonary Nodules Detected on CT Images:From the Fleischner Society 2017; published online before print (10.1148/radiol.IJ:2314499). Additional ancillary findings as above. Electronically Signed   By: Julian Hy M.D.   On: 01/18/2016 09:09    Assessment/Plan: We will await his plain films this morning. Clinically he seems improved. He is pain-free and is not vomiting. However, he has not had any bowel function. We'll hold him nothing by mouth bit longer to we can see the outcome of his films. He is in agreement with this plan.

## 2016-01-19 NOTE — Clinical Social Work Note (Signed)
Clinical Social Work Assessment  Patient Details  Name: Philip Garrett MRN: [id number] Date of Birth: [birth year]  Date of referral:  01/19/16               Reason for consult:  Facility Placement                Permission sought to share information with:  Facility Sport and exercise psychologist, Family Supports Permission granted to share information::  Yes, Verbal Permission Granted  Name::        Agency::     Relationship::     Contact Information:     Housing/Transportation Living arrangements for the past 2 months:  [place] of Information:  Patient Patient Interpreter Needed:  None Criminal Activity/Legal Involvement Pertinent to Current Situation/Hospitalization:  No - Comment as needed Significant Relationships:   (Niece: [phone]) Lives with:  Facility Resident Do you feel safe going back to the place where you live?  Yes Need for family participation in patient care:  Yes (Comment)  Care giving concerns:  None: Patient resides at [facility].   Social Worker assessment / plan:  CSW spoke with patient this morning and he confirms he lives at [place] and that he has lived there for 3 years. Patient confirms that his emergency contact is his niece: Ms. Kasparian: [phone]. Patient desires to return to [facility] at discharge. Patient states that his niece visited him last night. CSW spoke with Ms. Rotenberg and she is in agreement with patient returning to [facility] when time. CSW contacted Thayer Headings at [facility] and she stated that they will be happy to take patient back at discharge.   Employment status:    Insurance information:  Medicare PT Recommendations:  Not assessed at this time Information / Referral to community resources:     Patient/Family's Response to care:  Patient expressed appreciation for CSW assistance and visit. Patient's niece was also grateful for CSW call.  Patient/Family's Understanding of and Emotional Response to  Diagnosis, Current Treatment, and Prognosis:  Patient is happy that he is feeling much better and that whatever it was that made him sick has possibly cleared up.   Emotional Assessment Appearance:  Appears younger than stated age Attitude/Demeanor/Rapport:   (pleasant and cooperative) Affect (typically observed):  Calm, Pleasant, Appropriate Orientation:  Oriented to Self, Oriented to Place, Oriented to  Time, Oriented to Situation Alcohol / Substance use:  Not Applicable Psych involvement (Current and /or in the community):  No (Comment)  Discharge Needs  Concerns to be addressed:  Care Coordination Readmission within the last 30 days:  No Current discharge risk:  None Barriers to Discharge:  No Barriers Identified   Shela Leff, LCSW 01/19/2016, 11:25 AM

## 2016-01-19 NOTE — Progress Notes (Signed)
[facility] [place] NAME: Philip Garrett    MR#:  [id number]  DATE OF BIRTH:  02-Sep-1931  SUBJECTIVE:   Patient here due to abdominal pain and noted to have partial small bowel obstruction. Pain has improved since yesterday, no nausea, vomiting. KUB this morning showing improvement in dilated bowel loops. Started on clear liquids as per surgery.  REVIEW OF SYSTEMS:    Review of Systems  Constitutional: Negative for chills and fever.  HENT: Negative for congestion and tinnitus.   Eyes: Negative for blurred vision and double vision.  Respiratory: Negative for cough, shortness of breath and wheezing.   Cardiovascular: Negative for chest pain, orthopnea and PND.  Gastrointestinal: Negative for abdominal pain, diarrhea, nausea and vomiting.  Genitourinary: Negative for dysuria and hematuria.  Neurological: Negative for dizziness, sensory change and focal weakness.  All other systems reviewed and are negative.   Nutrition: Clear liquids Tolerating Diet: Yes Tolerating PT: Await Eval.   DRUG ALLERGIES:  No Known Allergies  VITALS:  Blood pressure (!) 122/59, pulse 70, temperature 98.9 F (37.2 C), temperature source Oral, resp. rate 16, height 6\' 4"  (1.93 m), weight 113.4 kg (250 lb), SpO2 100 %.  PHYSICAL EXAMINATION:   Physical Exam  GENERAL:  82 y.o.-year-old patient lying in the bed in no acute distress.  EYES: Pupils equal, round, reactive to light and accommodation. No scleral icterus. Extraocular muscles intact.  HEENT: Head atraumatic, normocephalic. Oropharynx and nasopharynx clear.  NECK:  Supple, no jugular venous distention. No thyroid enlargement, no tenderness.  LUNGS: Normal breath sounds bilaterally, no wheezing, rales, rhonchi. No use of accessory muscles of respiration.  CARDIOVASCULAR: S1, S2 normal. No murmurs, rubs, or gallops.  ABDOMEN: Soft, nontender, nondistended. Hypoactive Bowel sounds. No organomegaly or mass.   EXTREMITIES: No cyanosis, clubbing or edema b/l.    NEUROLOGIC: Cranial nerves II through XII are intact. No focal Motor or sensory deficits b/l.   PSYCHIATRIC: The patient is alert and oriented x 3.  SKIN: No obvious rash, lesion, or ulcer.    LABORATORY PANEL:   CBC  Recent Labs Lab 01/19/16 0427  WBC 6.0  HGB 10.1*  HCT 30.1*  PLT 183   ------------------------------------------------------------------------------------------------------------------  Chemistries   Recent Labs Lab 01/18/16 0737 01/19/16 0427  NA 135 138  K 4.2 3.8  CL 102 106  CO2 25 25  GLUCOSE 277* 112*  BUN 14 13  CREATININE 0.91 0.90  CALCIUM 9.6 8.5*  AST 18  --   ALT 12*  --   ALKPHOS 77  --   BILITOT 0.7  --    ------------------------------------------------------------------------------------------------------------------  Cardiac Enzymes  Recent Labs Lab 01/18/16 0737  TROPONINI <0.03   ------------------------------------------------------------------------------------------------------------------  RADIOLOGY:  Ct Abdomen Pelvis W Contrast  Result Date: 01/18/2016 CLINICAL DATA:  Sudden onset diffuse right abdominal pain with nausea EXAM: CT ABDOMEN AND PELVIS WITH CONTRAST TECHNIQUE: Multidetector CT imaging of the abdomen and pelvis was performed using the standard protocol following bolus administration of intravenous contrast. CONTRAST:  166mL ISOVUE-300 IOPAMIDOL (ISOVUE-300) INJECTION 61% COMPARISON:  05/07/2015 FINDINGS: Lower chest: 3 mm triangular subpleural nodule in the right middle lobe (series 4/ image 9). 2 mm nodule along the left fissure (series 4/image 8). Three vessel coronary atherosclerosis. Hepatobiliary: Liver is within normal limits. Gallbladder is unremarkable. No intrahepatic or extrahepatic ductal dilatation. Pancreas: Within normal limits. Spleen: Within normal limits. Adrenals/Urinary Tract: Adrenal glands are within normal limits. Small bilateral renal  cysts, measuring up to  13 mm in the lateral left lower pole (series 2/ image 44). Scattered nonobstructing left renal calculi measuring up to 4 mm (series 2/ image 45). 2 mm nonobstructing right lower pole renal calculus (series 2/ image 39). No ureteral or bladder calculi.  No hydronephrosis. Bladder is within normal limits. Stomach/Bowel: Stomach is within normal limits. Multiple dilated loops of small bowel throughout the central abdomen. Decompressed distal small bowel in the left mid abdomen (series 2/ image 57) leading to the terminal ileum. Tethering/ kinking small bowel loops in the central left abdominal mesentery (series 2/image 56). This appearance is compatible with mid/distal small bowel obstruction, likely on the basis of adhesions. Vascular/Lymphatic: No evidence of abdominal aortic aneurysm. Atherosclerotic calcifications of the abdominal aorta and branch vessels. No suspicious abdominopelvic lymphadenopathy. Reproductive: Prostatomegaly, with enlargement of the central gland which indents the base of the bladder. Other: Mild perihepatic and right mid abdominal ascites (series 2/ image 42). Trace pelvic ascites (series 2/ image 84). Mild mesenteric stranding along the right mid abdomen (series 2/ image 63). No free air. Musculoskeletal: Mild degenerative changes of the visualized thoracolumbar spine. IMPRESSION: Findings compatible with small bowel obstruction. Associated small volume abdominopelvic ascites.  No free air. Small pulmonary nodules at the lung bases measuring 2-3 mm. No follow-up needed if patient is low-risk (and has no known or suspected primary neoplasm). Non-contrast chest CT can be considered in 12 months if patient is high-risk. This recommendation follows the consensus statement: Guidelines for Management of Incidental Pulmonary Nodules Detected on CT Images:From the Fleischner Society 2017; published online before print (10.1148/radiol.SG:5268862). Additional ancillary findings  as above. Electronically Signed   By: Julian Hy M.D.   On: 01/18/2016 09:09   Dg Abd Portable 2v  Result Date: 01/19/2016 CLINICAL DATA:  Small bowel obstruction. EXAM: PORTABLE ABDOMEN - 2 VIEW COMPARISON:  01/18/2016 FINDINGS: Proving the gaseous distention of small bowel since prior CT. Gas within non sign the small bowel loops. Gas and oral contrast material noted within the colon. No free air organomegaly. Lung bases clear. IMPRESSION: Improving bowel gas pattern without current radiographic evidence for small bowel obstruction. Electronically Signed   By: Rolm Baptise M.D.   On: 01/19/2016 08:27     ASSESSMENT AND PLAN:   82 year old male with past medical history of CVA, hypertension, hyperlipidemia, diabetes, history of CHF, history of coronary artery disease presented to the hospital due to abdominal pain nausea vomiting and noted to have a partial small bowel obstruction.  1. Partial small bowel obstruction-being managed conservatively presently. Clinically improved since yesterday. No nausea vomiting. -KUB this morning showing improvement in bowel gas pattern. Started on clear liquids as per surgery. -Continue supportive care for now.  2. Essential hypertension-continue Norvasc, carvedilol.  3. History of CHF-clinically patient is not in congestive heart failure. -Continue carvedilol, ramipril.  4. Glaucoma-continue Latanoprost eye drops. .  5. Diabetes type 2 without complication-continue Lantus, sliding scale insulin.  6. Hyperlipidemia - cont. Atorvastatin.   Thanks for consult and will follow with you.   All the records are reviewed and case discussed with Care Management/Social Workerr. Management plans discussed with the patient, family and they are in agreement.  CODE STATUS: Full Code  DVT Prophylaxis: Lovenox  TOTAL TIME TAKING CARE OF THIS PATIENT: 25 minutes.   POSSIBLE D/C IN 1-2 DAYS, DEPENDING ON CLINICAL CONDITION.   Henreitta Leber M.D on  01/19/2016 at 3:38 PM  Between 7am to 6pm - Pager - [phone]  After 6pm go to [email or web address] -  password [email or web address] [facility]  [facility]  Office  [phone]  CC: Primary care physician; Volanda Napoleon, MD

## 2016-01-19 NOTE — Progress Notes (Signed)
Inpatient Diabetes Program Recommendations  AACE/ADA: New Consensus Statement on Inpatient Glycemic Control (2015)  Target Ranges:  Prepandial:   less than 140 mg/dL      Peak postprandial:   less than 180 mg/dL (1-2 hours)      Critically ill patients:  140 - 180 mg/dL   Results for Philip Garrett, Philip Garrett (MRN [id number]) as of 01/19/2016 14:07  Ref. Range 01/18/2016 17:16 01/18/2016 21:43 01/19/2016 07:29 01/19/2016 11:17  Glucose-Capillary Latest Ref Range: 65 - 99 mg/dL 199 (H) 146 (H) 108 (H) 124 (H)   Review of Glycemic Control  Diabetes history: DM2 Outpatient Diabetes medications: Levemir 35 units QAM, Tradjenta 5 mg daily, Metformin 1000 mg BID, Novolog 6 units BID (if glucose over 450 mg/dl) Current orders for Inpatient glycemic control: Novolog 0-9 units TID with meals, Novolog 0-5 units QHS, Lantus 5 units daily  Inpatient Diabetes Program Recommendations: HgbA1C: A1C 11.1% on 01/18/2016 indicating an average glucose of 272 mg/dl over the past 2-3 months.   NOTE: Spoke with patient about diabetes and outpatient regimen for diabetes control. Patient reports that he is followed by PCP for diabetes management and according to the chart patient is prescribed Levemir 35 units QAM, Tradjenta 5 mg daily, and Metformin 1000 mg BID, and Novolog 6 units BID if glucose is over 450 mg/dl as an outpatient for diabetes control. Patient reports that the saff at his facility administer his DM medications and he is not certain about what medications he is receiving. Patient reports that he is certain that he is receiving the Levemir 35 units QAM. Patient reports that his glucose is checked BID (fasting at bedtime) and it usually ranges from 240-350's mg/dl.   Discussed A1C results (11.1% on 01/18/16) and explained that his current A1C indicates an average glucose of 272 mg/dl over the past 2-3 months. Noted in chart review that prior A1C was 6.6% on 09/14/2015.  Discussed glucose and A1C goals. Discussed importance  maintaining good CBG control to prevent long-term and short-term complications. Explained how hyperglycemia leads to damage within blood vessels which lead to the common complications seen with uncontrolled diabetes. Discussed impact of nutrition, exercise, stress, sickness, and medications on diabetes control. Encouraged patient to follow up with PCP regarding glycemic control and ask if his glucose should be checked 3- 4 times per day (before meals and at bedtime) which will provide more information for his doctor to make adjustments with DM medications.  Patient verbalized understanding of information discussed and he states that he has no further questions at this time related to diabetes.  Agree with current inpatient orders for glycemic control and do not recommend any changes at this time.  Thanks, Barnie Alderman, RN, MSN, CDE Diabetes Coordinator Inpatient Diabetes Program [phone] (Team Pager) [phone] ([facility] office) [phone] [facility] office) [phone] [facility] office)

## 2016-01-20 ENCOUNTER — Observation Stay: Payer: Medicare Other

## 2016-01-20 DIAGNOSIS — K567 Ileus, unspecified: Secondary | ICD-10-CM | POA: Diagnosis not present

## 2016-01-20 DIAGNOSIS — K5669 Other intestinal obstruction: Secondary | ICD-10-CM | POA: Diagnosis not present

## 2016-01-20 LAB — CBC
HCT: 28.6 % — ABNORMAL LOW (ref 40.0–52.0)
Hemoglobin: 9.5 g/dL — ABNORMAL LOW (ref 13.0–18.0)
MCH: 26.9 pg (ref 26.0–34.0)
MCHC: 33.2 g/dL (ref 32.0–36.0)
MCV: 81.1 fL (ref 80.0–100.0)
PLATELETS: 173 10*3/uL (ref 150–440)
RBC: 3.52 MIL/uL — AB (ref 4.40–5.90)
RDW: 17 % — AB (ref 11.5–14.5)
WBC: 5 10*3/uL (ref 3.8–10.6)

## 2016-01-20 LAB — BASIC METABOLIC PANEL
Anion gap: 11 (ref 5–15)
BUN: 8 mg/dL (ref 6–20)
CALCIUM: 8.6 mg/dL — AB (ref 8.9–10.3)
CO2: 19 mmol/L — ABNORMAL LOW (ref 22–32)
CREATININE: 0.95 mg/dL (ref 0.61–1.24)
Chloride: 105 mmol/L (ref 101–111)
GFR calc Af Amer: >60 mL/min (ref >60)
Glucose, Bld: 199 mg/dL — ABNORMAL HIGH (ref 65–99)
POTASSIUM: 3.8 mmol/L (ref 3.5–5.1)
SODIUM: 135 mmol/L (ref 135–145)

## 2016-01-20 LAB — GLUCOSE, CAPILLARY
GLUCOSE-CAPILLARY: 164 mg/dL — AB (ref 65–99)
Glucose-Capillary: 239 mg/dL — ABNORMAL HIGH (ref 65–99)
Glucose-Capillary: 184 mg/dL — ABNORMAL HIGH (ref 65–99)
Glucose-Capillary: 240 mg/dL — ABNORMAL HIGH (ref 65–99)

## 2016-01-20 MED ORDER — FAMOTIDINE 20 MG PO TABS
20.0000 mg | ORAL_TABLET | Freq: Two times a day (BID) | ORAL | Status: DC
  Administered 2016-01-20 – 2016-01-21 (×2): 20 mg via ORAL
  Filled 2016-01-20 (×2): qty 1

## 2016-01-20 NOTE — Progress Notes (Signed)
Patient found to be wandering room after refusing bed alarm. IV had been pulled out by patient and he was headed to the bathroom. Dr. Pat Patrick notified and OK to leave IV out and discontinue fluids per MD.

## 2016-01-20 NOTE — Progress Notes (Signed)
Subjective:   He is feeling better today. He does not have any significant abdominal pain and has had no nausea in the last 24 hours. He's tolerating a full liquid diet.  Vital signs in last 24 hours: Temp:  [98.4 F (36.9 C)-98.9 F (37.2 C)] 98.7 F (37.1 C) (08/16 0522) Pulse Rate:  [70-83] 78 (08/16 0522) Resp:  [16-20] 20 (08/15 2103) BP: (122-158)/(59-81) 156/76 (08/16 0522) SpO2:  [98 %-100 %] 98 % (08/16 0522) Last BM Date: 01/17/16  Intake/Output from previous day: 08/15 0701 - 08/16 0700 In: [id number] [P.O.:120; I.V.:1671] Out: 2250 [Urine:2250]  Exam:  His abdomen is soft with good bowel sounds no abdominal tenderness. He does not have any rebound or guarding. He has no abdominal distention.  Lab Results:  CBC  Recent Labs  01/19/16 0427 01/20/16 0447  WBC 6.0 5.0  HGB 10.1* 9.5*  HCT 30.1* 28.6*  PLT 183 173   CMP     Component Value Date/Time   NA 135 01/20/2016 0447   NA 138 04/14/2014 0641   K 3.8 01/20/2016 0447   K 4.5 04/14/2014 0641   CL 105 01/20/2016 0447   CL 105 04/14/2014 0641   CO2 19 (L) 01/20/2016 0447   CO2 23 04/14/2014 0641   GLUCOSE 199 (H) 01/20/2016 0447   GLUCOSE 100 (H) 04/14/2014 0641   BUN 8 01/20/2016 0447   BUN 18 04/14/2014 0641   CREATININE 0.95 01/20/2016 0447   CREATININE 1.11 04/14/2014 0641   CALCIUM 8.6 (L) 01/20/2016 0447   CALCIUM 9.1 04/14/2014 0641   PROT 7.8 01/18/2016 0737   PROT 7.9 04/14/2014 0641   ALBUMIN 4.1 01/18/2016 0737   ALBUMIN 3.9 04/14/2014 0641   AST 18 01/18/2016 0737   AST 27 04/14/2014 0641   ALT 12 (L) 01/18/2016 0737   ALT 22 04/14/2014 0641   ALKPHOS 77 01/18/2016 0737   ALKPHOS 63 04/14/2014 0641   BILITOT 0.7 01/18/2016 0737   BILITOT 0.3 04/14/2014 0641   GFRNONAA >60 01/20/2016 0447   GFRNONAA >60 04/14/2014 0641   GFRNONAA >60 08/11/2013 0515   GFRAA >60 01/20/2016 0447   GFRAA >60 04/14/2014 0641   GFRAA >60 08/11/2013 0515   PT/INR No results for input(s): LABPROT, INR in  the last 72 hours.  Studies/Results: Ct Abdomen Pelvis W Contrast  Result Date: 01/18/2016 CLINICAL DATA:  Sudden onset diffuse right abdominal pain with nausea EXAM: CT ABDOMEN AND PELVIS WITH CONTRAST TECHNIQUE: Multidetector CT imaging of the abdomen and pelvis was performed using the standard protocol following bolus administration of intravenous contrast. CONTRAST:  115mL ISOVUE-300 IOPAMIDOL (ISOVUE-300) INJECTION 61% COMPARISON:  05/07/2015 FINDINGS: Lower chest: 3 mm triangular subpleural nodule in the right middle lobe (series 4/ image 9). 2 mm nodule along the left fissure (series 4/image 8). Three vessel coronary atherosclerosis. Hepatobiliary: Liver is within normal limits. Gallbladder is unremarkable. No intrahepatic or extrahepatic ductal dilatation. Pancreas: Within normal limits. Spleen: Within normal limits. Adrenals/Urinary Tract: Adrenal glands are within normal limits. Small bilateral renal cysts, measuring up to 13 mm in the lateral left lower pole (series 2/ image 44). Scattered nonobstructing left renal calculi measuring up to 4 mm (series 2/ image 45). 2 mm nonobstructing right lower pole renal calculus (series 2/ image 39). No ureteral or bladder calculi.  No hydronephrosis. Bladder is within normal limits. Stomach/Bowel: Stomach is within normal limits. Multiple dilated loops of small bowel throughout the central abdomen. Decompressed distal small bowel in the left mid abdomen (series 2/  image 57) leading to the terminal ileum. Tethering/ kinking small bowel loops in the central left abdominal mesentery (series 2/image 56). This appearance is compatible with mid/distal small bowel obstruction, likely on the basis of adhesions. Vascular/Lymphatic: No evidence of abdominal aortic aneurysm. Atherosclerotic calcifications of the abdominal aorta and branch vessels. No suspicious abdominopelvic lymphadenopathy. Reproductive: Prostatomegaly, with enlargement of the central gland which indents  the base of the bladder. Other: Mild perihepatic and right mid abdominal ascites (series 2/ image 42). Trace pelvic ascites (series 2/ image 84). Mild mesenteric stranding along the right mid abdomen (series 2/ image 63). No free air. Musculoskeletal: Mild degenerative changes of the visualized thoracolumbar spine. IMPRESSION: Findings compatible with small bowel obstruction. Associated small volume abdominopelvic ascites.  No free air. Small pulmonary nodules at the lung bases measuring 2-3 mm. No follow-up needed if patient is low-risk (and has no known or suspected primary neoplasm). Non-contrast chest CT can be considered in 12 months if patient is high-risk. This recommendation follows the consensus statement: Guidelines for Management of Incidental Pulmonary Nodules Detected on CT Images:From the Fleischner Society 2017; published online before print (10.1148/radiol.IJ:2314499). Additional ancillary findings as above. Electronically Signed   By: Julian Hy M.D.   On: 01/18/2016 09:09   Dg Abd Portable 2v  Result Date: 01/19/2016 CLINICAL DATA:  Small bowel obstruction. EXAM: PORTABLE ABDOMEN - 2 VIEW COMPARISON:  01/18/2016 FINDINGS: Proving the gaseous distention of small bowel since prior CT. Gas within non sign the small bowel loops. Gas and oral contrast material noted within the colon. No free air organomegaly. Lung bases clear. IMPRESSION: Improving bowel gas pattern without current radiographic evidence for small bowel obstruction. Electronically Signed   By: Rolm Baptise M.D.   On: 01/19/2016 08:27    Assessment/Plan: His labs have remained stable. His clinical presentation from the other day in the emergency room is much improved. We will repeat his plain films this morning and advance his diet. I anticipate discharge later today or tomorrow depending on his progress.

## 2016-01-20 NOTE — Care Management Obs Status (Signed)
[facility] NOTIFICATION   Patient Details  Name: TIMARION TRANA MRN: [id number] Date of Birth: 02/10/1932   Medicare Observation Status Notification Given:  Yes (reviewed with Judith Part Niece)    Beverly Sessions, RN 01/20/2016, 2:58 PM

## 2016-01-20 NOTE — Progress Notes (Signed)
[name] [place] NAME: Philip Garrett    MR#:  [id number]  DATE OF BIRTH:  February 01, 1932  SUBJECTIVE:   Pt. Here due to abdominal pain from SBO.  Asymptomatic today.  NO N/V.  X-ray still showing mild persistent SBO.    REVIEW OF SYSTEMS:    Review of Systems  Constitutional: Negative for chills and fever.  HENT: Negative for congestion and tinnitus.   Eyes: Negative for blurred vision and double vision.  Respiratory: Negative for cough, shortness of breath and wheezing.   Cardiovascular: Negative for chest pain, orthopnea and PND.  Gastrointestinal: Negative for abdominal pain, diarrhea, nausea and vomiting.  Genitourinary: Negative for dysuria and hematuria.  Neurological: Negative for dizziness, sensory change and focal weakness.  All other systems reviewed and are negative.   Nutrition: Soft diet Tolerating Diet: Yes Tolerating PT: Ambulatory  DRUG ALLERGIES:  No Known Allergies  VITALS:  Blood pressure 133/73, pulse 70, temperature 98.6 F (37 C), temperature source Oral, resp. rate 18, height 6\' 4"  (1.93 m), weight 113.4 kg (250 lb), SpO2 99 %.  PHYSICAL EXAMINATION:   Physical Exam  GENERAL:  82 y.o.-year-old patient lying in the bed in no acute distress.  EYES: Pupils equal, round, reactive to light and accommodation. No scleral icterus. Extraocular muscles intact.  HEENT: Head atraumatic, normocephalic. Oropharynx and nasopharynx clear.  NECK:  Supple, no jugular venous distention. No thyroid enlargement, no tenderness.  LUNGS: Normal breath sounds bilaterally, no wheezing, rales, rhonchi. No use of accessory muscles of respiration.  CARDIOVASCULAR: S1, S2 normal. No murmurs, rubs, or gallops.  ABDOMEN: Soft, nontender, nondistended. Hypoactive Bowel sounds. No organomegaly or mass.  EXTREMITIES: No cyanosis, clubbing or edema b/l.    NEUROLOGIC: Cranial nerves II through XII are intact. No focal Motor or sensory deficits  b/l.   PSYCHIATRIC: The patient is alert and oriented x 3.  SKIN: No obvious rash, lesion, or ulcer.    LABORATORY PANEL:   CBC  Recent Labs Lab 01/20/16 0447  WBC 5.0  HGB 9.5*  HCT 28.6*  PLT 173   ------------------------------------------------------------------------------------------------------------------  Chemistries   Recent Labs Lab 01/18/16 0737  01/20/16 0447  NA 135  < > 135  K 4.2  < > 3.8  CL 102  < > 105  CO2 25  < > 19*  GLUCOSE 277*  < > 199*  BUN 14  < > 8  CREATININE 0.91  < > 0.95  CALCIUM 9.6  < > 8.6*  AST 18  --   --   ALT 12*  --   --   ALKPHOS 77  --   --   BILITOT 0.7  --   --   < > = values in this interval not displayed. ------------------------------------------------------------------------------------------------------------------  Cardiac Enzymes  Recent Labs Lab 01/18/16 0737  TROPONINI <0.03   ------------------------------------------------------------------------------------------------------------------  RADIOLOGY:  Dg Abd 2 Views  Result Date: 01/20/2016 CLINICAL DATA:  Small-bowel obstruction. EXAM: ABDOMEN - 2 VIEW COMPARISON:  01/19/2016. FINDINGS: Soft tissue structures are unremarkable. Persistent mild small bowel distention. Oral contrast in the colon. No free air. No acute bony abnormality. Nipple shadows noted on the right. IMPRESSION: Persistent mild small bowel distention. Oral contrast in the colon. No free air. Electronically Signed   By: Marcello Moores  Register   On: 01/20/2016 08:29   Dg Abd Portable 2v  Result Date: 01/19/2016 CLINICAL DATA:  Small bowel obstruction. EXAM: PORTABLE ABDOMEN - 2 VIEW COMPARISON:  01/18/2016  FINDINGS: Proving the gaseous distention of small bowel since prior CT. Gas within non sign the small bowel loops. Gas and oral contrast material noted within the colon. No free air organomegaly. Lung bases clear. IMPRESSION: Improving bowel gas pattern without current radiographic evidence for  small bowel obstruction. Electronically Signed   By: Rolm Baptise M.D.   On: 01/19/2016 08:27     ASSESSMENT AND PLAN:   82 year old male with past medical history of CVA, hypertension, hyperlipidemia, diabetes, history of CHF, history of coronary artery disease presented to the hospital due to abdominal pain nausea vomiting and noted to have a partial small bowel obstruction.  1. Partial small bowel obstruction- KUB this a.m. Still showing some mild obstruction but clinically improved.  - NO complaints. Tolerating soft diet well.   2. Essential hypertension-continue Norvasc, carvedilol. - BP stable.   3. History of CHF-clinically patient is not in congestive heart failure. -Continue carvedilol, ramipril.  4. Glaucoma-continue Latanoprost eye drops. .  5. Diabetes type 2 without complication-continue Lantus, sliding scale insulin.  6. Hyperlipidemia - cont. Atorvastatin.   Will sign off for now.  Discussed w/ Dr. Pat Patrick.   All the records are reviewed and case discussed with Care Management/Social Workerr. Management plans discussed with the patient, family and they are in agreement.  CODE STATUS: Full Code  DVT Prophylaxis: Lovenox  TOTAL TIME TAKING CARE OF THIS PATIENT: 25 minutes.   POSSIBLE D/C IN 1-2 DAYS, DEPENDING ON CLINICAL CONDITION.   Philip Garrett M.D on 01/20/2016 at 2:28 PM  Between 7am to 6pm - Pager - [phone]  After 6pm go to [email or web address] - password [email or web address] [facility]  Office  [phone]  CC: Primary care physician; Volanda Napoleon, MD

## 2016-01-21 DIAGNOSIS — K5669 Other intestinal obstruction: Secondary | ICD-10-CM | POA: Diagnosis not present

## 2016-01-21 DIAGNOSIS — K567 Ileus, unspecified: Secondary | ICD-10-CM | POA: Diagnosis not present

## 2016-01-21 LAB — GLUCOSE, CAPILLARY: Glucose-Capillary: 195 mg/dL — ABNORMAL HIGH (ref 65–99)

## 2016-01-21 NOTE — NC FL2 (Addendum)
  [place] LEVEL OF CARE SCREENING TOOL     IDENTIFICATION  Patient Name: Philip Garrett Birthdate: 10/19/1931 Sex: male Admission Date (Current Location): 01/18/2016  [facility] and [place] Number:  Engineering geologist and Address:  [facility], [address] [place], [place] [address]      Provider Number: [id number]  Attending Physician Name and Address:  Dia Crawford III, MD  Relative Name and Phone Number:       Current Level of Care: Hospital Recommended Level of Care: [place] Prior Approval Number:    Date Approved/Denied:   PASRR Number:    Discharge Plan:  (ALF)    Current Diagnoses: Patient Active Problem List   Diagnosis Date Noted  . SBO (small bowel obstruction) ([place]) 01/18/2016  . Hypoglycemia 09/14/2015    Orientation RESPIRATION BLADDER Height & Weight     Self, Place  Normal Continent Weight: 250 lb (113.4 kg) Height:  6\' 4"  (193 cm)  BEHAVIORAL SYMPTOMS/MOOD NEUROLOGICAL BOWEL NUTRITION STATUS   (none)   Continent Diet (soft)  AMBULATORY STATUS COMMUNICATION OF NEEDS Skin   Independent Verbally Normal                       Personal Care Assistance Level of Assistance  Bathing, Dressing Bathing Assistance: Limited assistance   Dressing Assistance: Limited assistance     Functional Limitations Info             SPECIAL CARE FACTORS FREQUENCY                       Contractures Contractures Info: Not present    Additional Factors Info  Code Status Code Status Info: full             Current Facility-Administered Medications:  .acetaminophen (TYLENOL) tablet 500 mg, 500 mg, Oral, Q6H PRN, Dia Crawford III, MD .amLODipine (NORVASC) tablet 10 mg, 10 mg, Oral, Daily, Dia Crawford III, MD, 10 mg at 01/21/16 0820 .atorvastatin (LIPITOR) tablet 20 mg, 20 mg, Oral, Daily, Dia Crawford III, MD, 20 mg at 01/21/16 0820 .carvedilol (COREG) tablet 50 mg, 50 mg, Oral, BID WC, Dia Crawford III, MD, 50 mg at 01/21/16 0819 .latanoprost (XALATAN) 0.005 % ophthalmic solution 1 drop, 1 drop, Both Eyes, QHS, Dia Crawford III, MD, 1 drop at 01/20/16 2008 .ramipril (ALTACE) capsule 10 mg, 10 mg, Oral, Daily, Dia Crawford III, MD, 10 mg at 01/21/16 [id number] Discharge Medications:  Relevant Imaging Results:  Relevant Lab Results:   Additional Information    Shela Leff, LCSW

## 2016-01-21 NOTE — Progress Notes (Addendum)
Inpatient Diabetes Program Recommendations  AACE/ADA: New Consensus Statement on Inpatient Glycemic Control (2015)  Target Ranges:  Prepandial:   less than 140 mg/dL      Peak postprandial:   less than 180 mg/dL (1-2 hours)      Critically ill patients:  140 - 180 mg/dL   Lab Results  Component Value Date   GLUCAP 195 (H) 01/21/2016   HGBA1C 11.1 (H) 01/18/2016    Review of Glycemic Control  Diabetes history: DM2 Outpatient Diabetes medications: Levemir 35 units QAM, Tradjenta 5 mg daily, Metformin 1000 mg BID, Novolog 6 units BID (if glucose over 450 mg/dl)  Current orders for Inpatient glycemic control: Novolog 0-9 units TID with meals, Novolog 0-5 units QHS, Lantus 5 units daily  Inpatient Diabetes Program Recommendations: HgbA1C: A1C 11.1% on 01/18/2016 indicating an average glucose of 272 mg/dl over the past 2-3 months.   RN, Diabetes coordinator spoke at length to patient 01/19/16   If the patient is not discharged today, based on fasting blood sugar, consider increasing Lantus to 11 units qhs (0.1unit/kg).  Gentry Fitz, RN, BA, MHA, CDE Diabetes Coordinator Inpatient Diabetes Program  [phone] (Team Pager) [phone] ([place]) 01/21/2016 11:17 AM

## 2016-01-21 NOTE — Discharge Summary (Signed)
Patient ID: Philip Garrett MRN: [id number] DOB/AGE: May 28, 1932 82 y.o.  Admit date: 01/18/2016 Discharge date: 01/21/2016  Discharge Diagnoses:  Abdominal pain possible ileus  Procedures Performed: None  Discharged Condition: good  Hospital Course: He was moved to the emergency room with abdominal pain and mild nausea without episode of vomiting. Workup in the emergency room suggested an ileus versus small bowel obstruction. Time he was seen by the surgical service pain had improved and he did have a bowel movement. However with his CT findings suggesting possible partial small bowel obstruction was admitted the hospital. His nasogastric tube was discontinued first day and started on liquid diet. Is able to be advanced to a soft diet and has had no further problems.  Today's up active tolerating a diet with no complaints. We'll discharge him to his family care home to be followed by his primary care doctor and in our office as necessary. Discharge ranges will be provided by the care management service.  Discharge Orders: Discharge Instructions    Diet - low sodium heart healthy    Complete by:  As directed   Increase activity slowly    Complete by:  As directed      Disposition: 01-Home or Self Care  Discharge Medications:  Current Facility-Administered Medications:  .  acetaminophen (TYLENOL) tablet 500 mg, 500 mg, Oral, Q6H PRN, Dia Crawford III, MD .  amLODipine (NORVASC) tablet 10 mg, 10 mg, Oral, Daily, Dia Crawford III, MD, 10 mg at 01/21/16 0820 .  atorvastatin (LIPITOR) tablet 20 mg, 20 mg, Oral, Daily, Dia Crawford III, MD, 20 mg at 01/21/16 0820 .  carvedilol (COREG) tablet 50 mg, 50 mg, Oral, BID WC, Dia Crawford III, MD, 50 mg at 01/21/16 0819 .  Chlorhexidine Gluconate Cloth 2 % PADS 6 each, 6 each, Topical, Q0600, Dia Crawford III, MD, 6 each at 01/21/16 0600 .  enoxaparin (LOVENOX) injection 40 mg, 40 mg, Subcutaneous, Q24H, Dia Crawford III, MD, 40 mg at 01/20/16 2008 .  famotidine  (PEPCID) tablet 20 mg, 20 mg, Oral, BID, Henreitta Leber, MD, 20 mg at 01/21/16 0820 .  HYDROcodone-acetaminophen (NORCO/VICODIN) 5-325 MG per tablet 1-2 tablet, 1-2 tablet, Oral, Q4H PRN, Dia Crawford III, MD .  insulin aspart (novoLOG) injection 0-5 Units, 0-5 Units, Subcutaneous, QHS, Bettey Costa, MD, 2 Units at 01/20/16 2136 .  insulin aspart (novoLOG) injection 0-9 Units, 0-9 Units, Subcutaneous, TID WC, Bettey Costa, MD, 2 Units at 01/21/16 0819 .  insulin glargine (LANTUS) injection 5 Units, 5 Units, Subcutaneous, Daily, Bettey Costa, MD, 5 Units at 01/20/16 1006 .  latanoprost (XALATAN) 0.005 % ophthalmic solution 1 drop, 1 drop, Both Eyes, QHS, Dia Crawford III, MD, 1 drop at 01/20/16 2008 .  [START ON 01/28/2016] montelukast (SINGULAIR) tablet 10 mg, 10 mg, Oral, QODAY, Dia Crawford III, MD .  morphine 4 MG/ML injection 4 mg, 4 mg, Intravenous, Q2H PRN, Dia Crawford III, MD .  mupirocin ointment (BACTROBAN) 2 % 1 application, 1 application, Nasal, BID, Dia Crawford III, MD, 1 application at [phone] .  ondansetron (ZOFRAN-ODT) disintegrating tablet 4 mg, 4 mg, Oral, Q6H PRN **OR** ondansetron (ZOFRAN) injection 4 mg, 4 mg, Intravenous, Q6H PRN, Dia Crawford III, MD .  ramipril (ALTACE) capsule 10 mg, 10 mg, Oral, Daily, Dia Crawford III, MD, 10 mg at 01/21/16 [id number]  Follwup:   Signed: Dia Crawford III 01/21/2016, 9:04 AM

## 2016-01-21 NOTE — Progress Notes (Signed)
Alert but confused. Wandered out of the room several times this am. Vital signs stable . No signs of acute distress. Discharge instructions given to Staff from [place]. She   verbalizes understanding.

## 2016-01-21 NOTE — Progress Notes (Signed)
Current Facility-Administered Medications:  .  acetaminophen (TYLENOL) tablet 500 mg, 500 mg, Oral, Q6H PRN, Dia Crawford III, MD .  amLODipine (NORVASC) tablet 10 mg, 10 mg, Oral, Daily, Dia Crawford III, MD, 10 mg at 01/21/16 0820 .  atorvastatin (LIPITOR) tablet 20 mg, 20 mg, Oral, Daily, Dia Crawford III, MD, 20 mg at 01/21/16 0820 .  carvedilol (COREG) tablet 50 mg, 50 mg, Oral, BID WC, Dia Crawford III, MD, 50 mg at 01/21/16 0819 .  latanoprost (XALATAN) 0.005 % ophthalmic solution 1 drop, 1 drop, Both Eyes, QHS, Dia Crawford III, MD, 1 drop at 01/20/16 2008 .  ramipril (ALTACE) capsule 10 mg, 10 mg, Oral, Daily, Dia Crawford III, MD, 10 mg at 01/21/16 [id number]

## 2017-07-30 IMAGING — CT CT ABD-PELV W/ CM
2 of 5 series · 15 of 46 positions shown, 17 images · IV contrast (APPLIED)
Comparison: 05/07/2015

CLINICAL DATA: Sudden onset diffuse right abdominal pain with
nausea

EXAM:
CT ABDOMEN AND PELVIS WITH CONTRAST
TECHNIQUE: Multidetector CT imaging of the abdomen and pelvis was performed
using the standard protocol following bolus administration of
intravenous contrast.
CONTRAST:  100mL [id number]-U99 IOPAMIDOL ([id number]-U99) INJECTION 61%

[Series 2: axial st · axial · 0.92mm/px · z∈[-866,-420]mm · 12 of 101 slices shown, 14 images]
[im 6/101  soft-tissue]
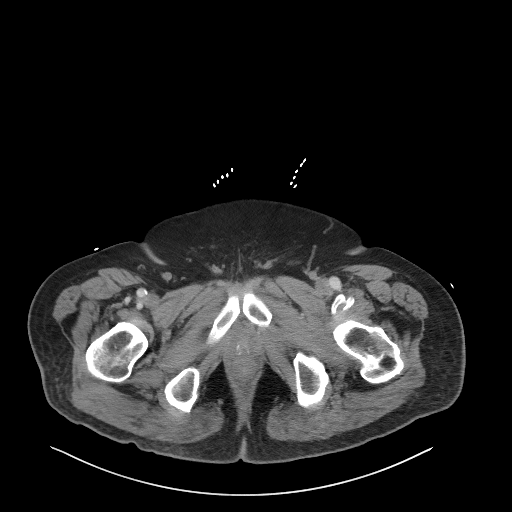
[im 6/101  bone]
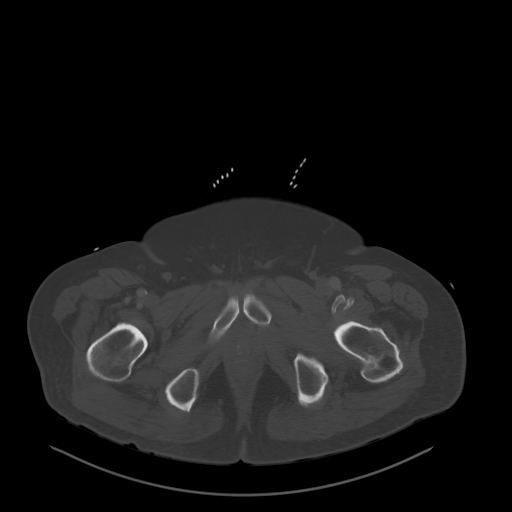
[im 16/101  soft-tissue]
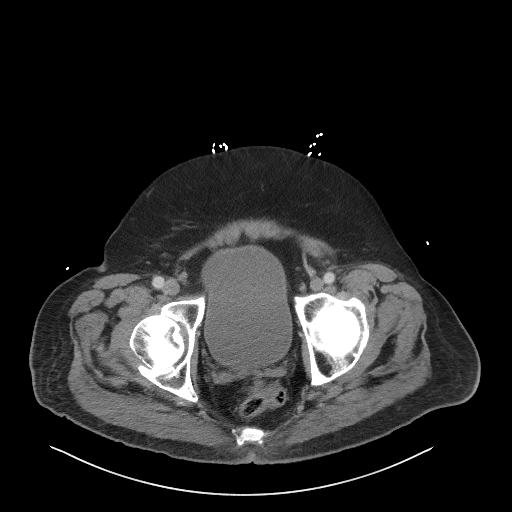
[im 22/101  soft-tissue]
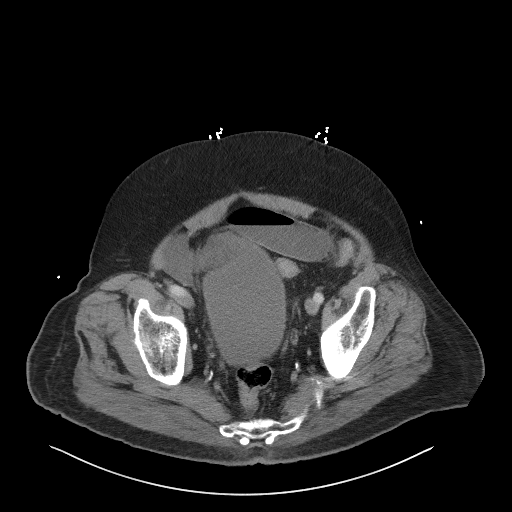
[im 32/101  soft-tissue]
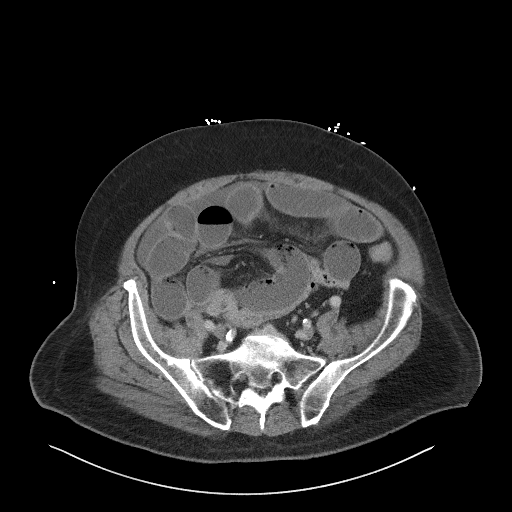
[im 37/101  soft-tissue]
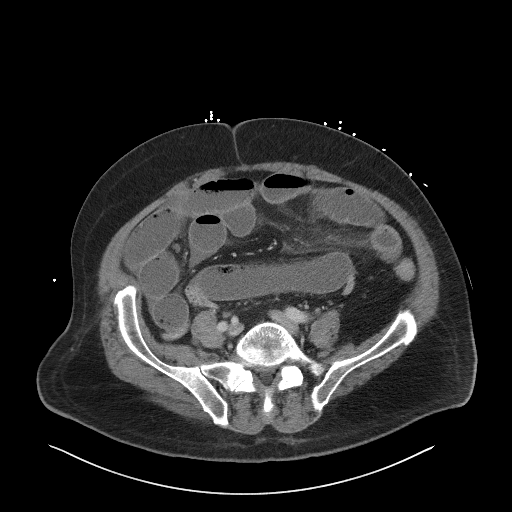
[im 48/101  soft-tissue]
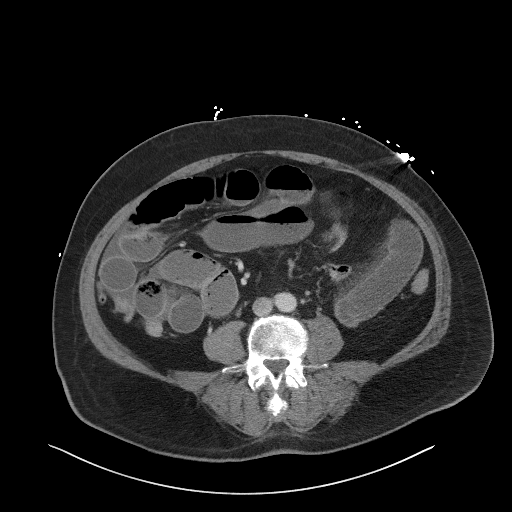
[im 53/101  soft-tissue]
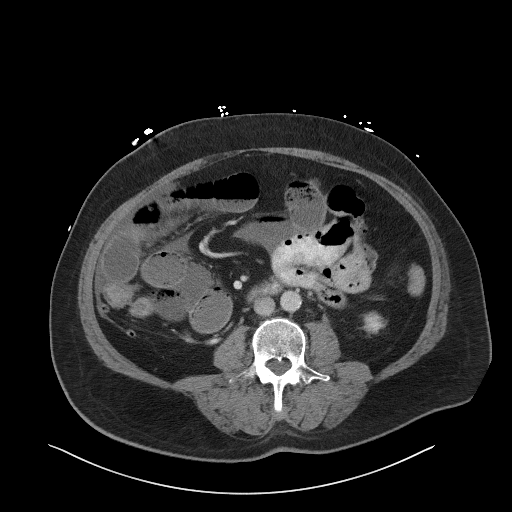
[im 64/101  soft-tissue]
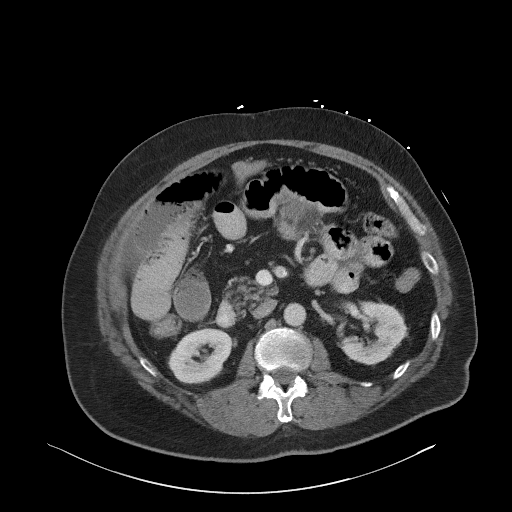
[im 69/101  soft-tissue]
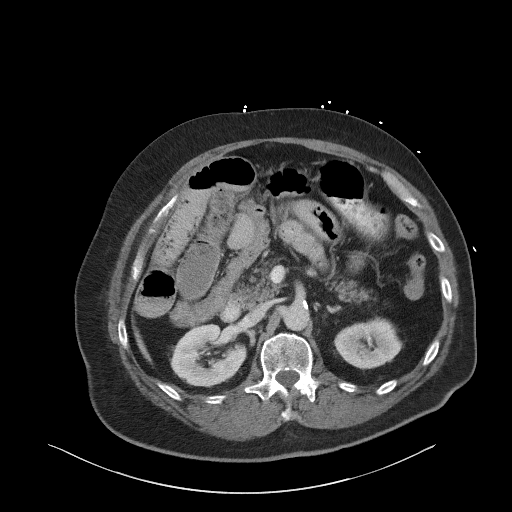
[im 69/101  bone]
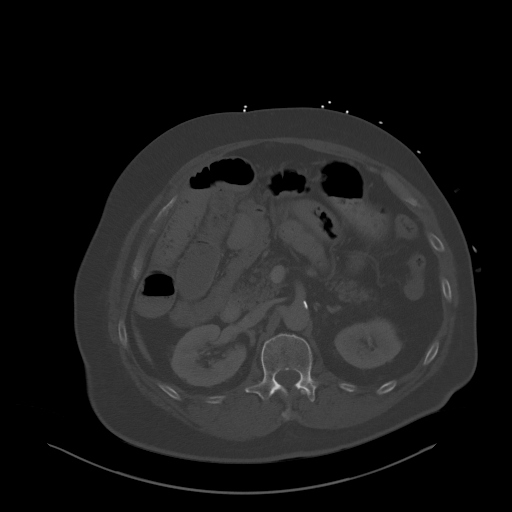
[im 79/101  soft-tissue]
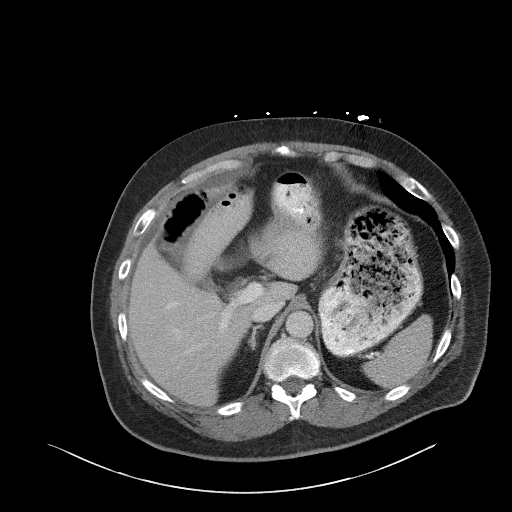
[im 85/101  soft-tissue]
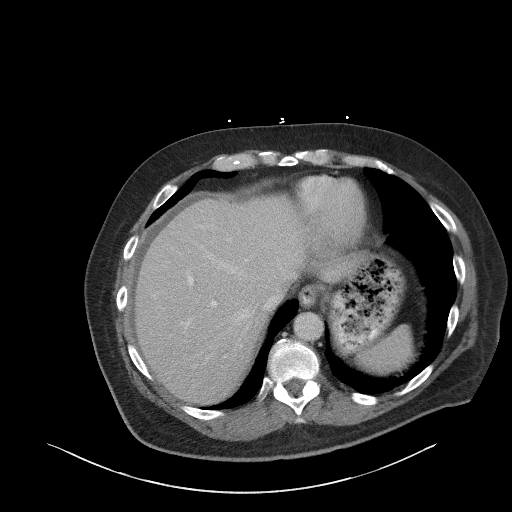
[im 95/101  soft-tissue]
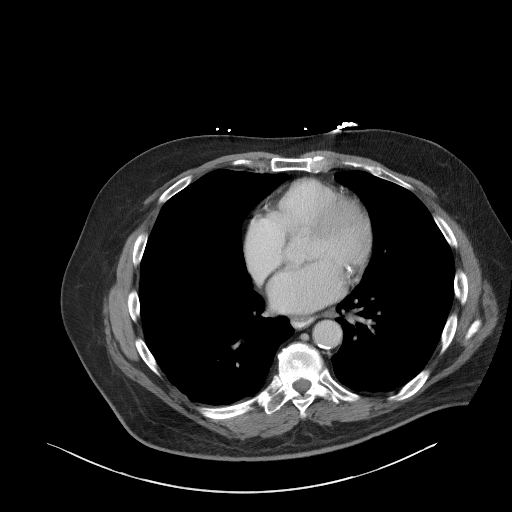

[Series 6: coronal st · coronal · 0.82mm/px · 3 of 104 slices shown]
[im 35/104  soft-tissue]
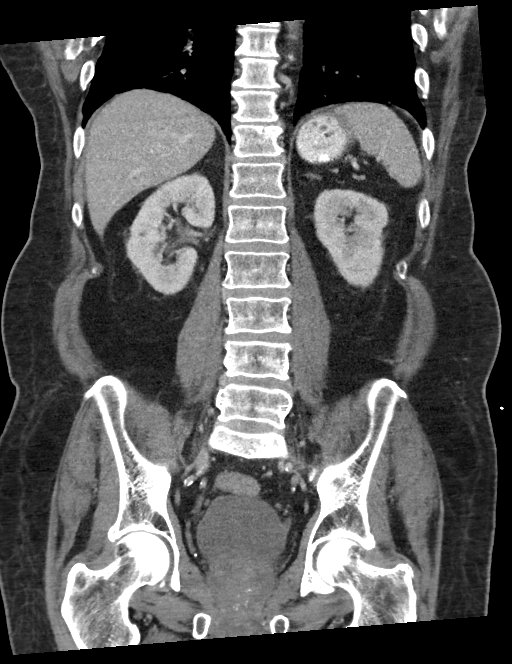
[im 46/104  soft-tissue]
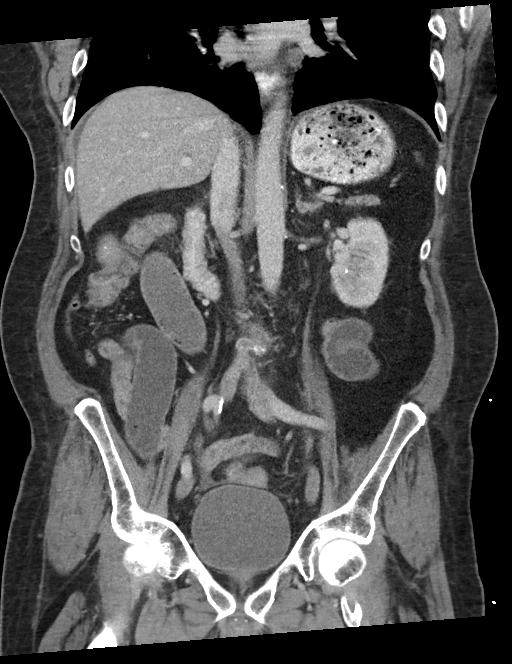
[im 58/104  soft-tissue]
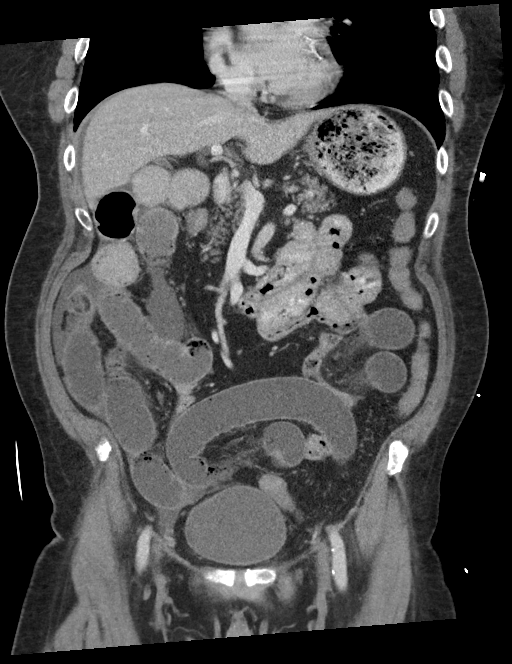

[15 of 46 positions shown; findings below may reference images not displayed]

FINDINGS: Lower chest: 3 mm triangular subpleural nodule in the right middle
lobe (series 4/ image 9). 2 mm nodule along the left fissure (series
4/image 8).

Three vessel coronary atherosclerosis.

Hepatobiliary: Liver is within normal limits.

Gallbladder is unremarkable. No intrahepatic or extrahepatic ductal
dilatation.

Pancreas: Within normal limits.

Spleen: Within normal limits.

Adrenals/Urinary Tract: Adrenal glands are within normal limits.

Small bilateral renal cysts, measuring up to 13 mm in the lateral
left lower pole (series 2/ image 44).

Scattered nonobstructing left renal calculi measuring up to 4 mm
(series 2/ image 45). 2 mm nonobstructing right lower pole renal
calculus (series 2/ image 39).

No ureteral or bladder calculi.  No hydronephrosis.

Bladder is within normal limits.

Stomach/Bowel: Stomach is within normal limits.

Multiple dilated loops of small bowel throughout the central
abdomen. Decompressed distal small bowel in the left mid abdomen
(series 2/ image 57) leading to the terminal ileum. Tethering/
kinking small bowel loops in the central left abdominal mesentery
(series 2/image 56). This appearance is compatible with mid/distal
small bowel obstruction, likely on the basis of adhesions.

Vascular/Lymphatic: No evidence of abdominal aortic aneurysm.

Atherosclerotic calcifications of the abdominal aorta and branch
vessels.

No suspicious abdominopelvic lymphadenopathy.

Reproductive: Prostatomegaly, with enlargement of the central gland
which indents the base of the bladder.

Other: Mild perihepatic and right mid abdominal ascites (series 2/
image 42). Trace pelvic ascites (series 2/ image 84).

Mild mesenteric stranding along the right mid abdomen (series 2/
image 63).

No free air.

Musculoskeletal: Mild degenerative changes of the visualized
thoracolumbar spine.
IMPRESSION: Findings compatible with small bowel obstruction.

Associated small volume abdominopelvic ascites.  No free air.

Small pulmonary nodules at the lung bases measuring 2-3 mm. No
follow-up needed if patient is low-risk (and has no known or
suspected primary neoplasm). Non-contrast chest CT can be considered
in 12 months if patient is high-risk. This recommendation follows
the consensus statement: Guidelines for Management of Incidental
Pulmonary Nodules Detected on CT Images:From the [HOSPITAL]
6797; published online before print (10.1148/radiol.6043474707).

Additional ancillary findings as above.

## 2017-08-01 IMAGING — CR DG ABDOMEN 2V
1 series · 3 of 3 positions shown · non-contrast
Comparison: 01/19/2016.

CLINICAL DATA: Small-bowel obstruction.

EXAM:
ABDOMEN - 2 VIEW

[Series 1: dg abd 2 views · 0.14mm/px · 3 of 3 slices shown]
[im 1/3]
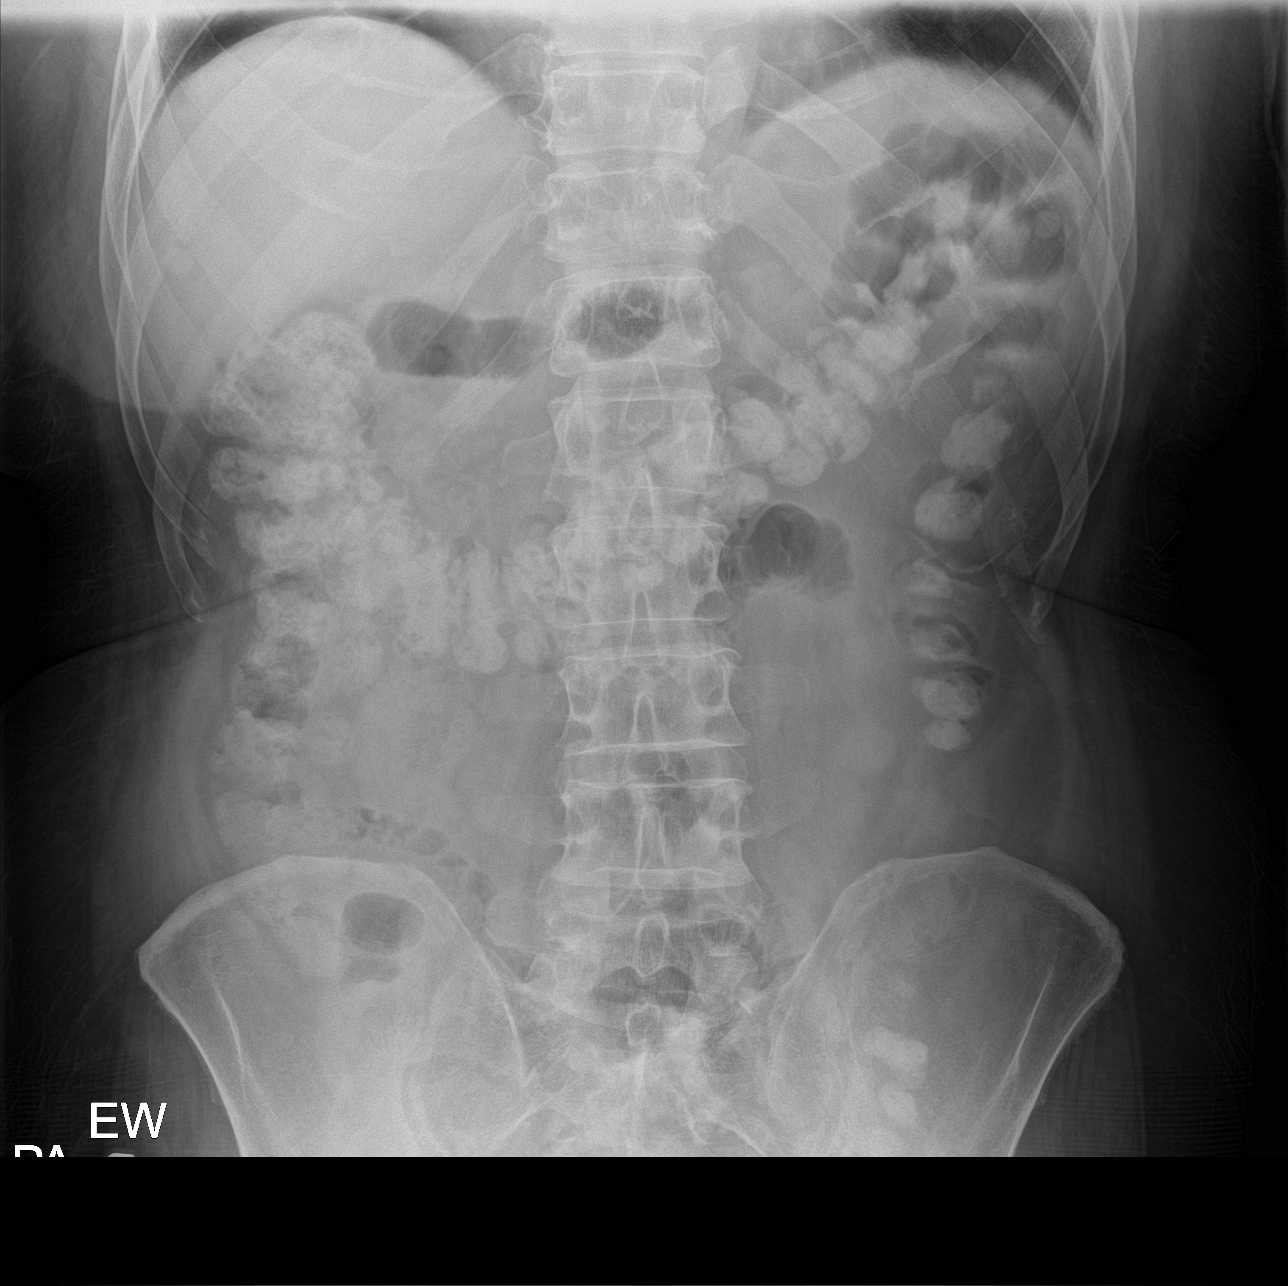
[im 2/3]
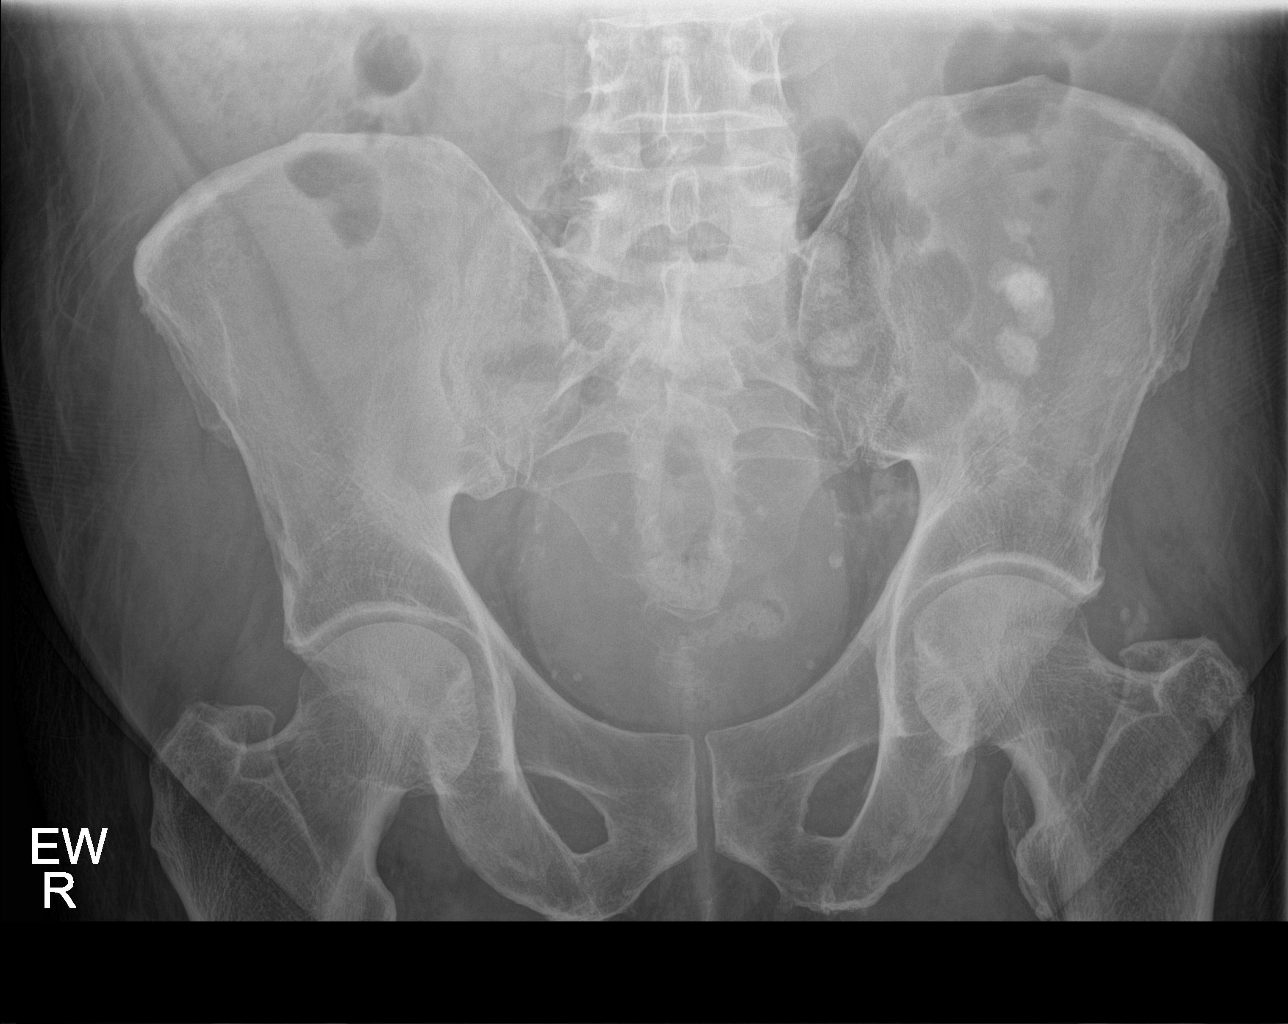
[im 3/3]
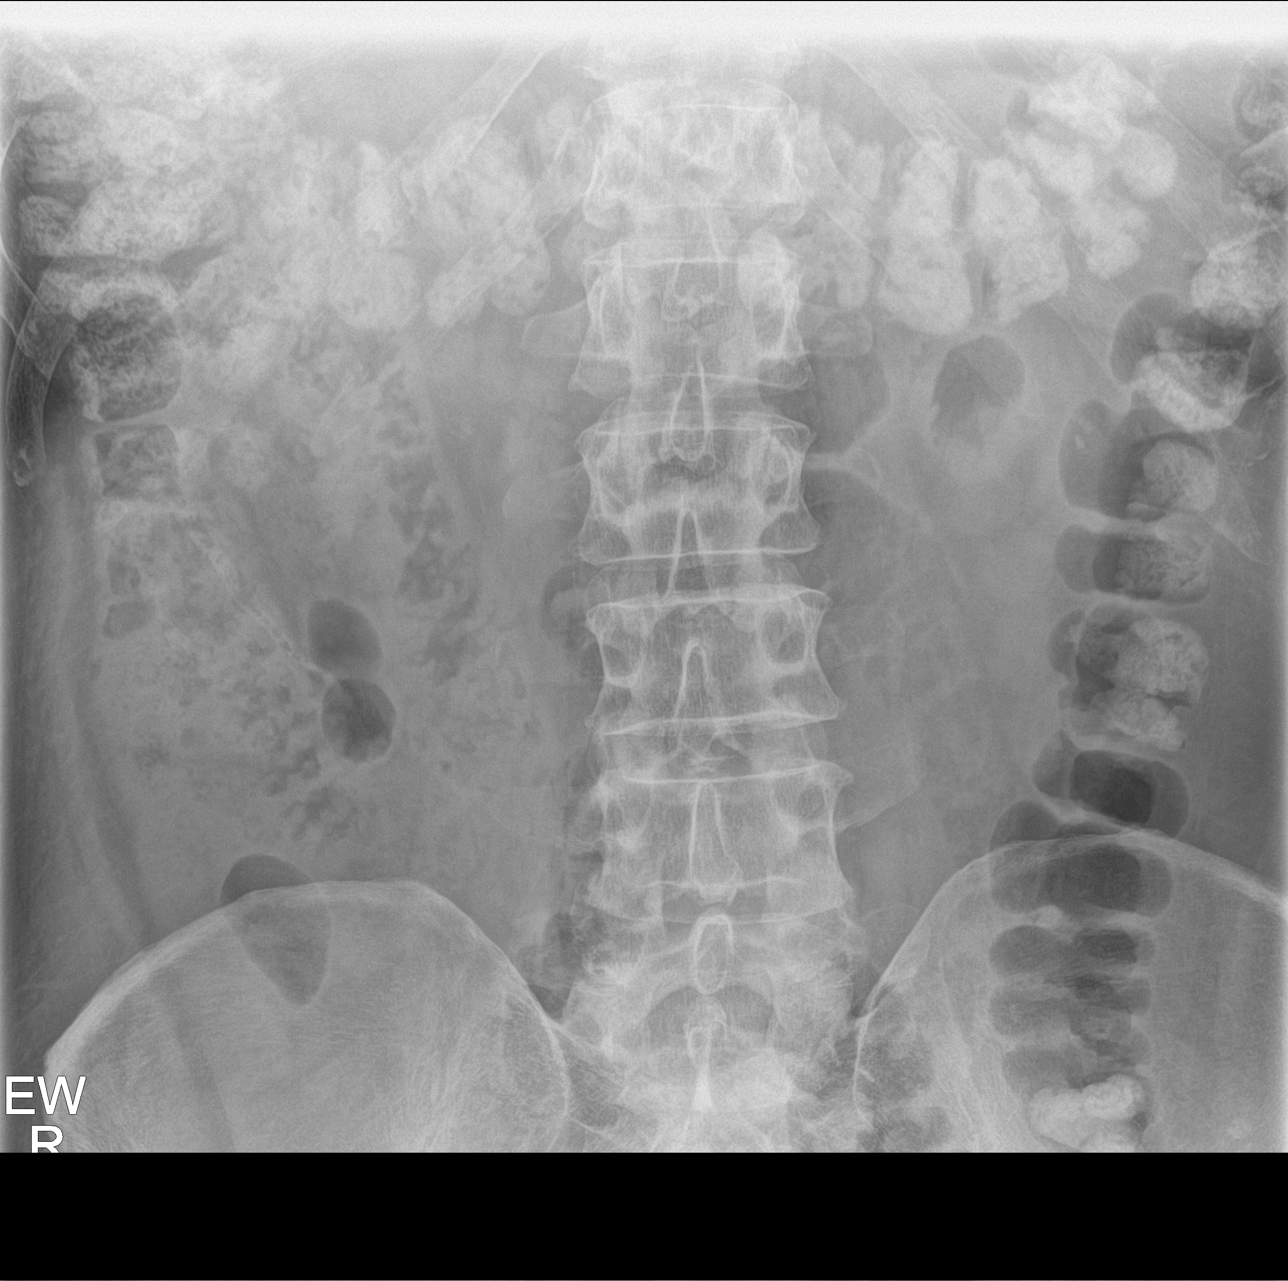

[3 of 3 positions shown; findings below may reference images not displayed]

FINDINGS: Soft tissue structures are unremarkable. Persistent mild small bowel
distention. Oral contrast in the colon. No free air. No acute bony
abnormality. Nipple shadows noted on the right.
IMPRESSION: Persistent mild small bowel distention. Oral contrast in the colon.
No free air.

## 2019-02-22 ENCOUNTER — Emergency Department: Payer: Medicare Other

## 2019-02-22 ENCOUNTER — Emergency Department
Admission: EM | Admit: 2019-02-22 | Discharge: 2019-02-22 | Disposition: A | Discharge status: Home / Self Care | Payer: Medicare Other | Attending: Emergency Medicine | Admitting: Emergency Medicine

## 2019-02-22 ENCOUNTER — Other Ambulatory Visit: Payer: Self-pay

## 2019-02-22 ENCOUNTER — Encounter: Payer: Self-pay | Admitting: Emergency Medicine

## 2019-02-22 DIAGNOSIS — I509 Heart failure, unspecified: Secondary | ICD-10-CM | POA: Diagnosis not present

## 2019-02-22 DIAGNOSIS — Z79899 Other long term (current) drug therapy: Secondary | ICD-10-CM | POA: Insufficient documentation

## 2019-02-22 DIAGNOSIS — Z8673 Personal history of transient ischemic attack (TIA), and cerebral infarction without residual deficits: Secondary | ICD-10-CM | POA: Diagnosis not present

## 2019-02-22 DIAGNOSIS — I110 Hypertensive heart disease with heart failure: Secondary | ICD-10-CM | POA: Diagnosis not present

## 2019-02-22 DIAGNOSIS — M25561 Pain in right knee: Secondary | ICD-10-CM | POA: Insufficient documentation

## 2019-02-22 DIAGNOSIS — Z794 Long term (current) use of insulin: Secondary | ICD-10-CM | POA: Insufficient documentation

## 2019-02-22 DIAGNOSIS — R410 Disorientation, unspecified: Secondary | ICD-10-CM | POA: Diagnosis not present

## 2019-02-22 DIAGNOSIS — E119 Type 2 diabetes mellitus without complications: Secondary | ICD-10-CM | POA: Diagnosis not present

## 2019-02-22 DIAGNOSIS — I259 Chronic ischemic heart disease, unspecified: Secondary | ICD-10-CM | POA: Diagnosis not present

## 2019-02-22 DIAGNOSIS — R531 Weakness: Secondary | ICD-10-CM | POA: Diagnosis present

## 2019-02-22 DIAGNOSIS — M6281 Muscle weakness (generalized): Secondary | ICD-10-CM | POA: Insufficient documentation

## 2019-02-22 DIAGNOSIS — Z7902 Long term (current) use of antithrombotics/antiplatelets: Secondary | ICD-10-CM | POA: Insufficient documentation

## 2019-02-22 LAB — URINALYSIS, COMPLETE (UACMP) WITH MICROSCOPIC
Bacteria, UA: NONE SEEN
Bilirubin Urine: NEGATIVE
Glucose, UA: NEGATIVE mg/dL
Hgb urine dipstick: NEGATIVE
Ketones, ur: NEGATIVE mg/dL
Nitrite: NEGATIVE
Protein, ur: NEGATIVE mg/dL
Specific Gravity, Urine: 1.021 (ref 1.005–1.030)
Squamous Epithelial / LPF: NONE SEEN (ref 0–5)
pH: 5 (ref 5.0–8.0)

## 2019-02-22 LAB — CBC WITH DIFFERENTIAL/PLATELET
Abs Immature Granulocytes: 0.02 10*3/uL (ref 0.00–0.07)
Basophils Absolute: 0 10*3/uL (ref 0.0–0.1)
Basophils Relative: 1 %
Eosinophils Absolute: 0.3 10*3/uL (ref 0.0–0.5)
Eosinophils Relative: 5 %
HCT: 30.8 % — ABNORMAL LOW (ref 39.0–52.0)
Hemoglobin: 9.7 g/dL — ABNORMAL LOW (ref 13.0–17.0)
Immature Granulocytes: 0 %
Lymphocytes Relative: 17 %
Lymphs Abs: 1.1 10*3/uL (ref 0.7–4.0)
MCH: 27.8 pg (ref 26.0–34.0)
MCHC: 31.5 g/dL (ref 30.0–36.0)
MCV: 88.3 fL (ref 80.0–100.0)
Monocytes Absolute: 0.9 10*3/uL (ref 0.1–1.0)
Monocytes Relative: 13 %
Neutro Abs: 4.2 10*3/uL (ref 1.7–7.7)
Neutrophils Relative %: 64 %
Platelets: 218 10*3/uL (ref 150–400)
RBC: 3.49 MIL/uL — ABNORMAL LOW (ref 4.22–5.81)
RDW: 14.6 % (ref 11.5–15.5)
WBC: 6.6 10*3/uL (ref 4.0–10.5)
nRBC: 0 % (ref 0.0–0.2)

## 2019-02-22 LAB — GLUCOSE, CAPILLARY: Glucose-Capillary: 196 mg/dL — ABNORMAL HIGH (ref 70–99)

## 2019-02-22 LAB — COMPREHENSIVE METABOLIC PANEL
ALT: 16 U/L (ref 0–44)
AST: 19 U/L (ref 15–41)
Albumin: 3.9 g/dL (ref 3.5–5.0)
Alkaline Phosphatase: 65 U/L (ref 38–126)
Anion gap: 11 (ref 5–15)
BUN: 19 mg/dL (ref 8–23)
CO2: 22 mmol/L (ref 22–32)
Calcium: 9.3 mg/dL (ref 8.9–10.3)
Chloride: 103 mmol/L (ref 98–111)
Creatinine, Ser: 0.98 mg/dL (ref 0.61–1.24)
GFR calc Af Amer: >60 mL/min (ref >60)
GFR calc non Af Amer: >60 mL/min (ref >60)
Glucose, Bld: 211 mg/dL — ABNORMAL HIGH (ref 70–99)
Potassium: 4.6 mmol/L (ref 3.5–5.1)
Sodium: 136 mmol/L (ref 135–145)
Total Bilirubin: 0.6 mg/dL (ref 0.3–1.2)
Total Protein: 7.4 g/dL (ref 6.5–8.1)

## 2019-02-22 LAB — BRAIN NATRIURETIC PEPTIDE: B Natriuretic Peptide: 83 pg/mL (ref 0.0–100.0)

## 2019-02-22 LAB — TROPONIN I (HIGH SENSITIVITY)
Troponin I (High Sensitivity): 8 ng/L (ref <18)
Troponin I (High Sensitivity): 7 ng/L (ref <18)

## 2019-02-22 MED ORDER — DICLOFENAC SODIUM 1 % TD GEL: 4.0000 g | Freq: Four times a day (QID) | TRANSDERMAL | 0 refills | Status: DC

## 2019-02-22 NOTE — ED Notes (Signed)
Pt walked using walker. Steady. C/o R knee discomfort and stated his leg felt different than usual.

## 2019-02-22 NOTE — ED Notes (Signed)
Pt leaving for imaging again. Family member at bedside.

## 2019-02-22 NOTE — ED Notes (Signed)
Mariann Laster (pt's niece) at bedside states pt does not yet have a legal guardian. States she is trying to obtain guardianship.

## 2019-02-22 NOTE — ED Triage Notes (Signed)
Pt arrived via EMS from [facility] [place] facility due to a combination of a higher blood sugar of 218 and new onset confusion/weakness. Pt had physical therapy earlier in the morning and physical therapist said this weakness/confusion is altered from baseline.

## 2019-02-22 NOTE — ED Notes (Signed)
Pt leaving for CT.  

## 2019-02-22 NOTE — ED Notes (Signed)
Pt has little edema in lower legs bilat. More in R ankle. Pt states mild to moderate pain in R knee and ankle. Denies hitting or falling on knee/ankle. States was working with PT at facility recently. Denies CP and SOB. Pt A&Ox4.

## 2019-02-22 NOTE — ED Notes (Signed)
Imaging, EDP, and this RN at bedside. Will place IV once EDP done assessing pt.

## 2019-02-22 NOTE — ED Provider Notes (Signed)
[facility] Emergency Department Provider Note   ____________________________________________   First MD Initiated Contact with Patient 02/22/19 1505     (approximate)  I have reviewed the triage vital signs and the nursing notes.   HISTORY  Chief Complaint Weakness    HPI Philip Garrett is a 85 y.o. male with past medical history of CAD, CHF, hypertension, diabetes, and TIA presents to the ED complaining of generalized weakness and leg pain.  Patient states "I have taken a turn for the worse".  He states his legs have been slightly more swollen than usual and he has had discomfort throughout both of his shins.  He denies any trauma to these areas, states he will occasionally have pain and swelling, but it is currently worse on the right side.  He was apparently working with physical therapy today and seemed to be confused at times, blood sugar was noted to be elevated and patient referred to the ED for further evaluation.  He reports otherwise feeling well, denies any recent fevers, chills, chest pain, cough, shortness of breath, abdominal pain, or urinary symptoms.        Past Medical History:  Diagnosis Date  . CHF (congestive heart failure) ([place])   . Coronary artery disease   . Diabetes mellitus without complication ([place])   . Hyperlipidemia   . Hypertension   . Myocardial infarction ([place])   . Stroke [facility])    TIA    Patient Active Problem List   Diagnosis Date Noted  . SBO (small bowel obstruction) ([place]) 01/18/2016  . Hypoglycemia 09/14/2015    Past Surgical History:  Procedure Laterality Date  . AMPUTATION TOE Left 10/30/2015   Procedure: AMPUTATION LEFT SECOND TOE/mpj left 2nd;  Surgeon: Sharlotte Alamo, DPM;  Location: [facility] ORS;  Service: Podiatry;  Laterality: Left;  . CORONARY ANGIOPLASTY    . TOE AMPUTATION Left    great toe    Prior to Admission medications   Medication Sig Start Date End Date Taking? Authorizing Provider  acetaminophen  (TYLENOL) 325 MG tablet Take 650 mg by mouth every 4 (four) hours as needed for mild pain, moderate pain, fever or headache.   Yes [provider]  acetaminophen (TYLENOL) 500 MG tablet Take 500 mg by mouth every 6 (six) hours as needed for mild pain or moderate pain.    Yes [provider]  alum & mag hydroxide-simeth (MYLANTA) 200-200-20 MG/5ML suspension Take 30 mLs by mouth as needed for indigestion or heartburn.   Yes [provider]  amLODipine (NORVASC) 10 MG tablet Take 10 mg by mouth daily.    Yes [provider]  atorvastatin (LIPITOR) 20 MG tablet Take 20 mg by mouth daily.   Yes [provider]  bismuth subsalicylate (PEPTO BISMOL) 262 MG/15ML suspension Take 15 mLs by mouth every 2 (two) hours as needed (nausea/vomiting).   Yes [provider]  calcium carbonate (TUMS - DOSED IN MG ELEMENTAL CALCIUM) 500 MG chewable tablet Chew 1 tablet by mouth daily.   Yes [provider]  carvedilol (COREG) 25 MG tablet Take 25 mg by mouth 2 (two) times daily with a meal.    Yes [provider]  clopidogrel (PLAVIX) 75 MG tablet Take 75 mg by mouth daily.   Yes [provider]  docusate sodium (COLACE) 100 MG capsule Take 100 mg by mouth 2 (two) times daily as needed for mild constipation.    Yes [provider]  ferrous gluconate (FERGON) 324  MG tablet Take 324 mg by mouth daily.   Yes [provider]  guaiFENesin (ROBITUSSIN) 100 MG/5ML SOLN Take 10 mLs by mouth every 6 (six) hours as needed for cough or to loosen phlegm.   Yes [provider]  Insulin Detemir (LEVEMIR FLEXTOUCH) 100 UNIT/ML Pen Inject 35-45 Units into the skin See admin instructions. 45 units every morning and 35 units every evening   Yes [provider]  latanoprost (XALATAN) 0.005 % ophthalmic solution Place 1 drop into both eyes at bedtime.   Yes [provider]  loperamide (IMODIUM A-D) 2 MG tablet Take 2  mg by mouth 4 (four) times daily as needed for diarrhea or loose stools.   Yes [provider]  loratadine (CLARITIN) 10 MG tablet Take 10 mg by mouth daily.   Yes [provider]  magnesium hydroxide (MILK OF MAGNESIA) 400 MG/5ML suspension Take 30 mLs by mouth daily as needed for mild constipation or moderate constipation.   Yes [provider]  metFORMIN (GLUCOPHAGE) 500 MG tablet Take 1 tablet (500 mg total) by mouth 2 (two) times daily with a meal. Start from 09/17/15 Patient taking differently: Take 1,000 mg by mouth 2 (two) times daily with a meal. Start from 09/17/15 09/15/15  Yes Gladstone Lighter, MD  PARoxetine (PAXIL) 10 MG tablet Take 10 mg by mouth daily.   Yes [provider]  ramipril (ALTACE) 10 MG capsule Take 10 mg by mouth daily.   Yes [provider]  Skin Protectants, Misc. (EUCERIN) cream Apply 1 application topically 2 (two) times daily.    Yes [provider]  sodium phosphate (FLEET) 7-19 GM/118ML ENEM Place 1 enema rectally daily as needed for severe constipation.   Yes [provider]  diclofenac sodium (VOLTAREN) 1 % GEL Apply 4 g topically 4 (four) times daily. 02/22/19   Blake Divine, MD  montelukast (SINGULAIR) 10 MG tablet Take 10 mg by mouth every other day. For 7 days, then dc 01/15/16 01/23/16  [provider]    Allergies Patient has no known allergies.  Family History  Problem Relation Age of Onset  . Diabetes Other     Social History Social History   Tobacco Use  . Smoking status: Never Smoker  . Smokeless tobacco: Current User    Types: Snuff, Chew  Substance Use Topics  . Alcohol use: No  . Drug use: No    Review of Systems  Constitutional: No fever/chills.  Positive for generalized weakness. Eyes: No visual changes. ENT: No sore throat. Cardiovascular: Denies chest pain. Respiratory: Denies shortness of breath. Gastrointestinal: No abdominal pain.  No nausea, no  vomiting.  No diarrhea.  No constipation. Genitourinary: Negative for dysuria. Musculoskeletal: Negative for back pain.  Positive for lower extremity pain and swelling. Skin: Negative for rash. Neurological: Negative for headaches, focal weakness or numbness.  ____________________________________________   PHYSICAL EXAM:  VITAL SIGNS: ED Triage Vitals  Enc Vitals Group     BP --      Pulse Rate 02/22/19 1500 74     Resp 02/22/19 1500 17     Temp 02/22/19 1500 97.8 F (36.6 C)     Temp Source 02/22/19 1500 Oral     SpO2 02/22/19 1456 100 %     Weight 02/22/19 1459 280 lb (127 kg)     Height 02/22/19 1459 6\' 4"  (1.93 m)     Head Circumference --      Peak Flow --  Pain Score 02/22/19 1458 0     Pain Loc --      Pain Edu? --      Excl. in [place]? --     Constitutional: Alert and oriented to person, place, time, and situation. Eyes: Conjunctivae are normal. Head: Atraumatic. Nose: No congestion/rhinnorhea. Mouth/Throat: Mucous membranes are moist. Neck: Normal ROM Cardiovascular: Normal rate, regular rhythm. Grossly normal heart sounds. Respiratory: Normal respiratory effort.  No retractions. Lungs CTAB. Gastrointestinal: Soft and nontender. No distention. Genitourinary: deferred Musculoskeletal: 1+ pitting edema to right lower extremity, trace edema to left lower extremity, diffuse tenderness to anterior shins and calves bilaterally.  Range of motion in lower extremities intact without pain. Neurologic:  Normal speech and language. No gross focal neurologic deficits are appreciated. Skin:  Skin is warm, dry and intact. No rash noted. Psychiatric: Mood and affect are normal. Speech and behavior are normal.  ____________________________________________   LABS (all labs ordered are listed, but only abnormal results are displayed)  Labs Reviewed  GLUCOSE, CAPILLARY - Abnormal; Notable for the following components:      Result Value   Glucose-Capillary 196 (*)    All  other components within normal limits  CBC WITH DIFFERENTIAL/PLATELET - Abnormal; Notable for the following components:   RBC 3.49 (*)    Hemoglobin 9.7 (*)    HCT 30.8 (*)    All other components within normal limits  COMPREHENSIVE METABOLIC PANEL - Abnormal; Notable for the following components:   Glucose, Bld 211 (*)    All other components within normal limits  URINALYSIS, COMPLETE (UACMP) WITH MICROSCOPIC - Abnormal; Notable for the following components:   Color, Urine YELLOW (*)    APPearance CLEAR (*)    Leukocytes,Ua SMALL (*)    All other components within normal limits  BRAIN NATRIURETIC PEPTIDE  TROPONIN I (HIGH SENSITIVITY)  TROPONIN I (HIGH SENSITIVITY)   ____________________________________________  EKG  ED ECG REPORT I, Blake Divine, the attending physician, personally viewed and interpreted this ECG.   Date: 02/22/2019  EKG Time: 15:04  Rate: 77  Rhythm: normal sinus rhythm  Axis: Normal  Intervals:none  ST&T Change: None   PROCEDURES  Procedure(s) performed (including Critical Care):  Procedures   ____________________________________________   INITIAL IMPRESSION / ASSESSMENT AND PLAN / ED COURSE       85 year old male with history of diabetes presents to the ED for lower extremity pain and swelling, also noted to be somewhat confused earlier today with physical therapy with some hyperglycemia.  On arrival in the ED, he is alert and oriented x3, appears to be at his baseline mental status.  No focal neurologic deficits on exam, will CT head to assess for possible explanation of episode for confusion, but doubt acute stroke.  Low suspicion for infectious process, no fevers or infectious symptoms.  He does have mild edema to his lower extremities along with some tenderness, no evidence of skin or soft tissue infection.  Pulses are intact and lower extremities warm and well-perfused.  Given increased swelling to right lower extremity, will check  ultrasound for DVT.  Also screen labs and UA.  CT head negative for acute process.  Patient's niece now at bedside and stating patient is at his baseline mental status, no apparent confusion.  Ultrasound negative for DVT.  Labs and UA are unremarkable.  Patient able to stand and bear weight but with some pain to right knee.  X-ray negative for acute process to knee, does show degenerative changes which are likely  a source of his pain.  There is some evidence of effusion, doubt septic arthritis as range of motion is intact.  Patient appropriate for discharge back to nursing facility, will prescribe Voltaren gel for right knee, counseled patient to follow-up with PCP.  Patient and family agree with plan.      ____________________________________________   FINAL CLINICAL IMPRESSION(S) / ED DIAGNOSES  Final diagnoses:  Generalized weakness  Acute pain of right knee     ED Discharge Orders         Ordered    diclofenac sodium (VOLTAREN) 1 % GEL  4 times daily     02/22/19 1902           Note:  This document was prepared using Dragon voice recognition software and may include unintentional dictation errors.   Blake Divine, MD 02/22/19 1944

## 2019-02-22 NOTE — ED Notes (Signed)
Pt/family briefly updated.

## 2019-02-22 NOTE — ED Notes (Signed)
Pt wheeled out to car.  

## 2019-04-05 ENCOUNTER — Emergency Department: Payer: Medicare Other

## 2019-04-05 ENCOUNTER — Encounter: Payer: Self-pay | Admitting: Emergency Medicine

## 2019-04-05 ENCOUNTER — Emergency Department
Admission: EM | Admit: 2019-04-05 | Discharge: 2019-04-06 | Disposition: A | Discharge status: Other Acute Inpatient Hospital | Payer: Medicare Other | Attending: Emergency Medicine | Admitting: Emergency Medicine

## 2019-04-05 ENCOUNTER — Other Ambulatory Visit: Payer: Self-pay

## 2019-04-05 DIAGNOSIS — I252 Old myocardial infarction: Secondary | ICD-10-CM | POA: Insufficient documentation

## 2019-04-05 DIAGNOSIS — G9520 Unspecified cord compression: Secondary | ICD-10-CM | POA: Insufficient documentation

## 2019-04-05 DIAGNOSIS — W19XXXA Unspecified fall, initial encounter: Secondary | ICD-10-CM

## 2019-04-05 DIAGNOSIS — Z79899 Other long term (current) drug therapy: Secondary | ICD-10-CM | POA: Diagnosis not present

## 2019-04-05 DIAGNOSIS — Z8673 Personal history of transient ischemic attack (TIA), and cerebral infarction without residual deficits: Secondary | ICD-10-CM | POA: Insufficient documentation

## 2019-04-05 DIAGNOSIS — Z7902 Long term (current) use of antithrombotics/antiplatelets: Secondary | ICD-10-CM | POA: Insufficient documentation

## 2019-04-05 DIAGNOSIS — Z794 Long term (current) use of insulin: Secondary | ICD-10-CM | POA: Diagnosis not present

## 2019-04-05 DIAGNOSIS — F17220 Nicotine dependence, chewing tobacco, uncomplicated: Secondary | ICD-10-CM | POA: Insufficient documentation

## 2019-04-05 DIAGNOSIS — I2510 Atherosclerotic heart disease of native coronary artery without angina pectoris: Secondary | ICD-10-CM | POA: Insufficient documentation

## 2019-04-05 DIAGNOSIS — I509 Heart failure, unspecified: Secondary | ICD-10-CM | POA: Diagnosis not present

## 2019-04-05 DIAGNOSIS — I110 Hypertensive heart disease with heart failure: Secondary | ICD-10-CM | POA: Insufficient documentation

## 2019-04-05 DIAGNOSIS — R531 Weakness: Secondary | ICD-10-CM | POA: Diagnosis present

## 2019-04-05 LAB — COMPREHENSIVE METABOLIC PANEL
ALT: 17 U/L (ref 0–44)
AST: 23 U/L (ref 15–41)
Albumin: 3.8 g/dL (ref 3.5–5.0)
Alkaline Phosphatase: 75 U/L (ref 38–126)
Anion gap: 11 (ref 5–15)
BUN: 26 mg/dL — ABNORMAL HIGH (ref 8–23)
CO2: 22 mmol/L (ref 22–32)
Calcium: 9.8 mg/dL (ref 8.9–10.3)
Chloride: 102 mmol/L (ref 98–111)
Creatinine, Ser: 1.11 mg/dL (ref 0.61–1.24)
GFR calc Af Amer: >60 mL/min (ref >60)
GFR calc non Af Amer: >60 mL/min (ref >60)
Glucose, Bld: 167 mg/dL — ABNORMAL HIGH (ref 70–99)
Potassium: 4.7 mmol/L (ref 3.5–5.1)
Sodium: 135 mmol/L (ref 135–145)
Total Bilirubin: 0.6 mg/dL (ref 0.3–1.2)
Total Protein: 7.4 g/dL (ref 6.5–8.1)

## 2019-04-05 LAB — CBC WITH DIFFERENTIAL/PLATELET
Abs Immature Granulocytes: 0.04 10*3/uL (ref 0.00–0.07)
Basophils Absolute: 0 10*3/uL (ref 0.0–0.1)
Basophils Relative: 0 %
Eosinophils Absolute: 0.2 10*3/uL (ref 0.0–0.5)
Eosinophils Relative: 3 %
HCT: 31 % — ABNORMAL LOW (ref 39.0–52.0)
Hemoglobin: 10.1 g/dL — ABNORMAL LOW (ref 13.0–17.0)
Immature Granulocytes: 1 %
Lymphocytes Relative: 11 %
Lymphs Abs: 0.8 10*3/uL (ref 0.7–4.0)
MCH: 28.1 pg (ref 26.0–34.0)
MCHC: 32.6 g/dL (ref 30.0–36.0)
MCV: 86.1 fL (ref 80.0–100.0)
Monocytes Absolute: 0.6 10*3/uL (ref 0.1–1.0)
Monocytes Relative: 8 %
Neutro Abs: 5.4 10*3/uL (ref 1.7–7.7)
Neutrophils Relative %: 77 %
Platelets: 238 10*3/uL (ref 150–400)
RBC: 3.6 MIL/uL — ABNORMAL LOW (ref 4.22–5.81)
RDW: 14.9 % (ref 11.5–15.5)
WBC: 7 10*3/uL (ref 4.0–10.5)
nRBC: 0 % (ref 0.0–0.2)

## 2019-04-05 LAB — TSH: TSH: 1.112 u[IU]/mL (ref 0.350–4.500)

## 2019-04-05 LAB — URINALYSIS, ROUTINE W REFLEX MICROSCOPIC
Bilirubin Urine: NEGATIVE
Glucose, UA: NEGATIVE mg/dL
Hgb urine dipstick: NEGATIVE
Ketones, ur: NEGATIVE mg/dL
Leukocytes,Ua: NEGATIVE
Nitrite: NEGATIVE
Protein, ur: NEGATIVE mg/dL
Specific Gravity, Urine: 1.016 (ref 1.005–1.030)
pH: 5 (ref 5.0–8.0)

## 2019-04-05 LAB — TROPONIN I (HIGH SENSITIVITY)
Troponin I (High Sensitivity): 7 ng/L (ref <18)
Troponin I (High Sensitivity): 7 ng/L (ref <18)

## 2019-04-05 LAB — T4, FREE: Free T4: 1.05 ng/dL (ref 0.61–1.12)

## 2019-04-05 LAB — MAGNESIUM: Magnesium: 1.2 mg/dL — ABNORMAL LOW (ref 1.7–2.4)

## 2019-04-05 MED ORDER — MAGNESIUM SULFATE 2 GM/50ML IV SOLN
INTRAVENOUS | Status: AC
  Filled 2019-04-05: qty 50

## 2019-04-05 MED ORDER — MAGNESIUM SULFATE 2 GM/50ML IV SOLN
2.0000 g | Freq: Once | INTRAVENOUS | Status: AC
  Administered 2019-04-05: 18:00:00 2 g via INTRAVENOUS

## 2019-04-05 MED ORDER — MAGNESIUM SULFATE IN D5W 1-5 GM/100ML-% IV SOLN
1.0000 g | Freq: Once | INTRAVENOUS | Status: DC
  Filled 2019-04-05: qty 100

## 2019-04-05 NOTE — ED Triage Notes (Signed)
Pt arrived from [facility] via [facility]. Pt c/o weakness in legs. Per EMS pt stated felt like hs legs were going to give out on him as he was trying to get to bathroom. Pt stated he fell and hurt his muscle by his rt knee. Pt denies hitting head. Pt denies dizziness.

## 2019-04-05 NOTE — ED Notes (Signed)
Pt transported to MRI 

## 2019-04-05 NOTE — ED Provider Notes (Addendum)
[facility] Emergency Department Provider Note  ____________________________________________   First MD Initiated Contact with Patient 04/05/19 1529     (approximate)  I have reviewed the triage vital signs and the nursing notes.   HISTORY  Chief Complaint Fall    HPI Philip Garrett is a 85 y.o. male with CAD, CHF, HTN, diabetes, TIA who presents with generalized weakness.  Per EMS patient was found in the bathroom unable to stand up from using the bathroom.  He felt bilateral leg weakness.  Per patinet he did fall on the ground.  He had good strength in his arms and legs.  Sugars were normal.  Unclear if he hit his head.  He is denying any tenderness just says he overall just feels a little bit of weakness.  Patient denies any back pain, belly pain, shortness of breath or chest pain.  No urinary symptoms.  Fall for today, unclear exactly what time, brought on by leg weakness, occurred one time.   Past Medical History:  Diagnosis Date   CHF (congestive heart failure) (HCC)    Coronary artery disease    Diabetes mellitus without complication ([place])    Hyperlipidemia    Hypertension    Myocardial infarction [facility])    Stroke [facility])    TIA    Patient Active Problem List   Diagnosis Date Noted   SBO (small bowel obstruction) ([place]) 01/18/2016   Hypoglycemia 09/14/2015    Past Surgical History:  Procedure Laterality Date   AMPUTATION TOE Left 10/30/2015   Procedure: AMPUTATION LEFT SECOND TOE/mpj left 2nd;  Surgeon: Sharlotte Alamo, DPM;  Location: [facility] ORS;  Service: Podiatry;  Laterality: Left;   CORONARY ANGIOPLASTY     TOE AMPUTATION Left    great toe    Prior to Admission medications   Medication Sig Start Date End Date Taking? Authorizing Provider  acetaminophen (TYLENOL) 325 MG tablet Take 650 mg by mouth every 4 (four) hours as needed for mild pain, moderate pain, fever or headache.    [provider]  acetaminophen (TYLENOL)  500 MG tablet Take 500 mg by mouth every 6 (six) hours as needed for mild pain or moderate pain.     [provider]  alum & mag hydroxide-simeth (MYLANTA) 200-200-20 MG/5ML suspension Take 30 mLs by mouth as needed for indigestion or heartburn.    [provider]  amLODipine (NORVASC) 10 MG tablet Take 10 mg by mouth daily.     [provider]  atorvastatin (LIPITOR) 20 MG tablet Take 20 mg by mouth daily.    [provider]  bismuth subsalicylate (PEPTO BISMOL) 262 MG/15ML suspension Take 15 mLs by mouth every 2 (two) hours as needed (nausea/vomiting).    [provider]  calcium carbonate (TUMS - DOSED IN MG ELEMENTAL CALCIUM) 500 MG chewable tablet Chew 1 tablet by mouth daily.    [provider]  carvedilol (COREG) 25 MG tablet Take 25 mg by mouth 2 (two) times daily with a meal.     [provider]  clopidogrel (PLAVIX) 75 MG tablet Take 75 mg by mouth daily.    [provider]  diclofenac sodium (VOLTAREN) 1 % GEL Apply 4 g topically 4 (four) times daily. 02/22/19   Blake Divine, MD  docusate sodium (COLACE) 100 MG capsule Take 100 mg by mouth 2 (two) times daily as needed for mild constipation.     [provider]  ferrous gluconate (FERGON) 324 MG tablet Take 324  mg by mouth daily.    [provider]  guaiFENesin (ROBITUSSIN) 100 MG/5ML SOLN Take 10 mLs by mouth every 6 (six) hours as needed for cough or to loosen phlegm.    [provider]  Insulin Detemir (LEVEMIR FLEXTOUCH) 100 UNIT/ML Pen Inject 35-45 Units into the skin See admin instructions. 45 units every morning and 35 units every evening    [provider]  latanoprost (XALATAN) 0.005 % ophthalmic solution Place 1 drop into both eyes at bedtime.    [provider]  loperamide (IMODIUM A-D) 2 MG tablet Take 2 mg by mouth 4 (four) times daily as needed for diarrhea or loose stools.    [provider]    loratadine (CLARITIN) 10 MG tablet Take 10 mg by mouth daily.    [provider]  magnesium hydroxide (MILK OF MAGNESIA) 400 MG/5ML suspension Take 30 mLs by mouth daily as needed for mild constipation or moderate constipation.    [provider]  metFORMIN (GLUCOPHAGE) 500 MG tablet Take 1 tablet (500 mg total) by mouth 2 (two) times daily with a meal. Start from 09/17/15 Patient taking differently: Take 1,000 mg by mouth 2 (two) times daily with a meal. Start from 09/17/15 09/15/15   Gladstone Lighter, MD  montelukast (SINGULAIR) 10 MG tablet Take 10 mg by mouth every other day. For 7 days, then dc 01/15/16 01/23/16  [provider]  PARoxetine (PAXIL) 10 MG tablet Take 10 mg by mouth daily.    [provider]  ramipril (ALTACE) 10 MG capsule Take 10 mg by mouth daily.    [provider]  Skin Protectants, Misc. (EUCERIN) cream Apply 1 application topically 2 (two) times daily.     [provider]  sodium phosphate (FLEET) 7-19 GM/118ML ENEM Place 1 enema rectally daily as needed for severe constipation.    [provider]    Allergies Patient has no known allergies.  Family History  Problem Relation Age of Onset   Diabetes Other     Social History Social History   Tobacco Use   Smoking status: Never Smoker   Smokeless tobacco: Current User    Types: Snuff, Chew  Substance Use Topics   Alcohol use: No   Drug use: No      Review of Systems Constitutional: No fever/chills Eyes: No visual changes. ENT: No sore throat. Cardiovascular: Denies chest pain. Respiratory: Denies shortness of breath. Gastrointestinal: No abdominal pain.  No nausea, no vomiting.  No diarrhea.  No constipation. Genitourinary: Negative for dysuria. Musculoskeletal: Negative for back pain. Skin: Negative for rash. Neurological: Negative for headaches, focal weakness or numbness.  Positive for fall and bilateral leg weakness. All other  ROS negative ____________________________________________   PHYSICAL EXAM:  VITAL SIGNS: Blood pressure 114/65, pulse 64, temperature (!) 97.4 F (36.3 C), temperature source Oral, resp. rate 16, height 6\' 5"  (1.956 m), weight 117.9 kg, SpO2 100 %.  Constitutional: Alert and oriented except to year.. Well appearing and in no acute distress. Eyes: Conjunctivae are normal. EOMI. Head: Atraumatic. Nose: No congestion/rhinnorhea. Mouth/Throat: Mucous membranes are moist.   Neck: No stridor. Trachea Midline. FROM Cardiovascular: Normal rate, regular rhythm. Grossly normal heart sounds.  Good peripheral circulation. Respiratory: Normal respiratory effort.  No retractions. Lungs CTAB. Gastrointestinal: Soft and nontender. No distention. No abdominal bruits.  Musculoskeletal: Trace edema bilaterally.  No joint effusions.  Toe amputations on the left.  All extremities were palpated without evidence of pain or swelling or abrasions. Neurologic:  Normal speech and language. No gross focal neurologic deficits are appreciated.  Equal strength in the arms and legs.  5 out of 5 dorsiflexion and plantarflexion.   Skin:  Skin is warm, dry and intact. No rash noted. Psychiatric: Mood and affect are normal. Speech and behavior are normal. GU: Deferred   ____________________________________________   LABS (all labs ordered are listed, but only abnormal results are displayed)  Labs Reviewed  CBC WITH DIFFERENTIAL/PLATELET - Abnormal; Notable for the following components:      Result Value   RBC 3.60 (*)    Hemoglobin 10.1 (*)    HCT 31.0 (*)    All other components within normal limits  COMPREHENSIVE METABOLIC PANEL - Abnormal; Notable for the following components:   Glucose, Bld 167 (*)    BUN 26 (*)    All other components within normal limits  MAGNESIUM - Abnormal; Notable for the following components:   Magnesium 1.2 (*)    All other components within normal limits  URINALYSIS, ROUTINE W  REFLEX MICROSCOPIC - Abnormal; Notable for the following components:   Color, Urine YELLOW (*)    APPearance CLEAR (*)    All other components within normal limits  TSH  T4, FREE  TROPONIN I (HIGH SENSITIVITY)  TROPONIN I (HIGH SENSITIVITY)   ____________________________________________   ED ECG REPORT I, Vanessa [place], the attending physician, personally viewed and interpreted this ECG.  EKG is normal sinus rate of 64, no ST elevation, no T wave inversion, normal intervals ____________________________________________  RADIOLOGY Robert Bellow, personally viewed and evaluated these images (plain radiographs) as part of my medical decision making, as well as reviewing the written report by the radiologist.  ED MD interpretation:  No evidence of fracture   Official radiology report(s): Ct Head Wo Contrast  Result Date: 04/05/2019 CLINICAL DATA:  Bilateral lower extremity weakness, fall. EXAM: CT HEAD WITHOUT CONTRAST CT CERVICAL SPINE WITHOUT CONTRAST TECHNIQUE: Multidetector CT imaging of the head and cervical spine was performed following the standard protocol without intravenous contrast. Multiplanar CT image reconstructions of the cervical spine were also generated. COMPARISON:  February 22, 2019. FINDINGS: CT HEAD FINDINGS Brain: Mild chronic ischemic white matter disease is noted. No mass effect or midline shift is noted. Ventricular size is within normal limits. There is no evidence of mass lesion, hemorrhage or acute infarction. Vascular: No hyperdense vessel or unexpected calcification. Skull: Normal. Negative for fracture or focal lesion. Sinuses/Orbits: No acute finding. Other: None. CT CERVICAL SPINE FINDINGS Alignment: Minimal grade 1 anterolisthesis of C4-5 and C5-6 is noted secondary to posterior facet joint hypertrophy. Skull base and vertebrae: No acute fracture. No primary bone lesion or focal pathologic process. Soft tissues and spinal canal: No prevertebral fluid or  swelling. No visible canal hematoma. Disc levels: Severe degenerative disc disease is noted at C5-6. Mild degenerative disc disease is noted at C4-5 and C6-7. Upper chest: Negative. Other: Degenerative changes are seen involving posterior facet joints bilaterally. IMPRESSION: Mild chronic ischemic white matter disease. No acute intracranial abnormality seen. Multilevel degenerative disc disease. No acute abnormality seen in the cervical spine. Electronically Signed   By: Marijo Conception M.D.   On: 04/05/2019 16:18   Ct Cervical Spine Wo Contrast  Result Date: 04/05/2019 CLINICAL DATA:  Bilateral lower extremity weakness, fall. EXAM: CT HEAD WITHOUT CONTRAST CT CERVICAL SPINE WITHOUT CONTRAST TECHNIQUE: Multidetector CT imaging of the head and cervical spine was performed following the standard protocol without intravenous contrast. Multiplanar CT image  reconstructions of the cervical spine were also generated. COMPARISON:  February 22, 2019. FINDINGS: CT HEAD FINDINGS Brain: Mild chronic ischemic white matter disease is noted. No mass effect or midline shift is noted. Ventricular size is within normal limits. There is no evidence of mass lesion, hemorrhage or acute infarction. Vascular: No hyperdense vessel or unexpected calcification. Skull: Normal. Negative for fracture or focal lesion. Sinuses/Orbits: No acute finding. Other: None. CT CERVICAL SPINE FINDINGS Alignment: Minimal grade 1 anterolisthesis of C4-5 and C5-6 is noted secondary to posterior facet joint hypertrophy. Skull base and vertebrae: No acute fracture. No primary bone lesion or focal pathologic process. Soft tissues and spinal canal: No prevertebral fluid or swelling. No visible canal hematoma. Disc levels: Severe degenerative disc disease is noted at C5-6. Mild degenerative disc disease is noted at C4-5 and C6-7. Upper chest: Negative. Other: Degenerative changes are seen involving posterior facet joints bilaterally. IMPRESSION: Mild chronic  ischemic white matter disease. No acute intracranial abnormality seen. Multilevel degenerative disc disease. No acute abnormality seen in the cervical spine. Electronically Signed   By: Marijo Conception M.D.   On: 04/05/2019 16:18   Mr Brain Wo Contrast  Result Date: 04/05/2019 CLINICAL DATA:  Fall bilateral lower extremity weakness EXAM: MRI HEAD WITHOUT CONTRAST TECHNIQUE: Multiplanar, multiecho pulse sequences of the brain and surrounding structures were obtained without intravenous contrast. COMPARISON:  Head CT 04/05/2019 FINDINGS: BRAIN: There is no acute infarct, acute hemorrhage or extra-axial collection. Multifocal white matter hyperintensity, most commonly due to chronic ischemic microangiopathy. Posterior fossa arachnoid cyst versus mega cisterna magna. The other midline structures are normal. Mild generalized atrophy, not greater than expected for age . VASCULAR: The major intracranial arterial and venous sinus flow voids are normal. Susceptibility-sensitive sequences show no chronic microhemorrhage or superficial siderosis. SKULL AND UPPER CERVICAL SPINE: Calvarial bone marrow signal is normal. There is no skull base mass. The visualized upper cervical spine and soft tissues are normal. SINUSES/ORBITS: There are no fluid levels or advanced mucosal thickening. The mastoid air cells and middle ear cavities are free of fluid. The orbits are normal. IMPRESSION: 1. No acute intracranial abnormality. 2. Mild atrophy and chronic small vessel disease. Electronically Signed   By: Ulyses Jarred M.D.   On: 04/05/2019 23:29   Mr Cervical Spine Wo Contrast  Result Date: 04/05/2019 CLINICAL DATA:  Fall. Bilateral leg weakness. EXAM: MRI CERVICAL, THORACIC AND LUMBAR SPINE WITHOUT CONTRAST TECHNIQUE: Multiplanar and multiecho pulse sequences of the cervical spine, to include the craniocervical junction and cervicothoracic junction, and thoracic and lumbar spine, were obtained without intravenous contrast.  COMPARISON:  None. FINDINGS: MRI CERVICAL SPINE FINDINGS Alignment: Trace grade 1 retrolisthesis at C6-7. Vertebrae: No acute compression fracture, discitis-osteomyelitis, facet edema or other focal marrow lesion. No epidural collection. Cord: Mild hyperintense T2-weighted signal in the spinal cord at the C3-4 level. Posterior Fossa, vertebral arteries, paraspinal tissues: Negative. Disc levels: C1-2: Unremarkable. C2-3: No spinal canal stenosis. C3-4: Intermediate sized central disc protrusion with ligamentum flavum redundancy causing severe spinal canal stenosis and cord compression. C4-5: Severe spinal canal stenosis with flattening of the spinal cord due to central disc protrusion. C5-6: Bilateral uncovertebral hypertrophy and small disc bulge with severe left foraminal stenosis. C6-7: Mild disc bulge without spinal canal stenosis. C7-T1: Normal. MRI THORACIC SPINE FINDINGS Alignment:  Physiologic. Vertebrae: No fracture, evidence of discitis, or bone lesion. Cord:  Normal signal and morphology. Paraspinal and other soft tissues: Negative. Disc levels: There is a small central disc protrusion at T7-8  without spinal canal stenosis. MRI LUMBAR SPINE FINDINGS Segmentation:  Normal Alignment:  Normal Vertebrae: There is mild discogenic endplate signal change at L5-S1. Conus medullaris and cauda equina: Conus extends to the L1 level. There is buckling of the cauda equina nerve roots. Paraspinal and other soft tissues: Negative Disc levels: L1-2: Normal. L2-3: Intermediate disc bulge with moderate spinal canal stenosis. No neural foraminal stenosis. L3-4: Large disc bulge, worst in the right subarticular region. Severe spinal canal stenosis with crowding of the cauda equina nerve roots. No neural foraminal stenosis. L4-5: Right subarticular disc protrusion with narrowing of the right subarticular recess. Mild right foraminal stenosis. No central spinal canal stenosis. L5-S1: Small central disc protrusion without  spinal canal or neural foraminal stenosis. IMPRESSION: 1. Severe spinal canal stenosis at C3-4 and C4-5 with cord compression and mild hyperintense T2-weighted signal in the spinal cord at both levels, which may indicate edema or myelomalacia. 2. Severe L3-4 and moderate L2-3 spinal canal stenosis with crowding of the cauda equina nerve roots. 3. Unremarkable thoracic spine. Critical Value/emergent results were called by telephone at the time of interpretation on 04/05/2019 at 11:53 pm to Dr. Marjean Donna , who verbally acknowledged these results. Electronically Signed   By: Ulyses Jarred M.D.   On: 04/05/2019 23:55   Mr Thoracic Spine Wo Contrast  Result Date: 04/05/2019 CLINICAL DATA:  Fall. Bilateral leg weakness. EXAM: MRI CERVICAL, THORACIC AND LUMBAR SPINE WITHOUT CONTRAST TECHNIQUE: Multiplanar and multiecho pulse sequences of the cervical spine, to include the craniocervical junction and cervicothoracic junction, and thoracic and lumbar spine, were obtained without intravenous contrast. COMPARISON:  None. FINDINGS: MRI CERVICAL SPINE FINDINGS Alignment: Trace grade 1 retrolisthesis at C6-7. Vertebrae: No acute compression fracture, discitis-osteomyelitis, facet edema or other focal marrow lesion. No epidural collection. Cord: Mild hyperintense T2-weighted signal in the spinal cord at the C3-4 level. Posterior Fossa, vertebral arteries, paraspinal tissues: Negative. Disc levels: C1-2: Unremarkable. C2-3: No spinal canal stenosis. C3-4: Intermediate sized central disc protrusion with ligamentum flavum redundancy causing severe spinal canal stenosis and cord compression. C4-5: Severe spinal canal stenosis with flattening of the spinal cord due to central disc protrusion. C5-6: Bilateral uncovertebral hypertrophy and small disc bulge with severe left foraminal stenosis. C6-7: Mild disc bulge without spinal canal stenosis. C7-T1: Normal. MRI THORACIC SPINE FINDINGS Alignment:  Physiologic. Vertebrae: No  fracture, evidence of discitis, or bone lesion. Cord:  Normal signal and morphology. Paraspinal and other soft tissues: Negative. Disc levels: There is a small central disc protrusion at T7-8 without spinal canal stenosis. MRI LUMBAR SPINE FINDINGS Segmentation:  Normal Alignment:  Normal Vertebrae: There is mild discogenic endplate signal change at L5-S1. Conus medullaris and cauda equina: Conus extends to the L1 level. There is buckling of the cauda equina nerve roots. Paraspinal and other soft tissues: Negative Disc levels: L1-2: Normal. L2-3: Intermediate disc bulge with moderate spinal canal stenosis. No neural foraminal stenosis. L3-4: Large disc bulge, worst in the right subarticular region. Severe spinal canal stenosis with crowding of the cauda equina nerve roots. No neural foraminal stenosis. L4-5: Right subarticular disc protrusion with narrowing of the right subarticular recess. Mild right foraminal stenosis. No central spinal canal stenosis. L5-S1: Small central disc protrusion without spinal canal or neural foraminal stenosis. IMPRESSION: 1. Severe spinal canal stenosis at C3-4 and C4-5 with cord compression and mild hyperintense T2-weighted signal in the spinal cord at both levels, which may indicate edema or myelomalacia. 2. Severe L3-4 and moderate L2-3 spinal canal stenosis with crowding  of the cauda equina nerve roots. 3. Unremarkable thoracic spine. Critical Value/emergent results were called by telephone at the time of interpretation on 04/05/2019 at 11:53 pm to Dr. Marjean Donna , who verbally acknowledged these results. Electronically Signed   By: Ulyses Jarred M.D.   On: 04/05/2019 23:55   Mr Lumbar Spine Wo Contrast  Result Date: 04/05/2019 CLINICAL DATA:  Fall. Bilateral leg weakness. EXAM: MRI CERVICAL, THORACIC AND LUMBAR SPINE WITHOUT CONTRAST TECHNIQUE: Multiplanar and multiecho pulse sequences of the cervical spine, to include the craniocervical junction and cervicothoracic junction,  and thoracic and lumbar spine, were obtained without intravenous contrast. COMPARISON:  None. FINDINGS: MRI CERVICAL SPINE FINDINGS Alignment: Trace grade 1 retrolisthesis at C6-7. Vertebrae: No acute compression fracture, discitis-osteomyelitis, facet edema or other focal marrow lesion. No epidural collection. Cord: Mild hyperintense T2-weighted signal in the spinal cord at the C3-4 level. Posterior Fossa, vertebral arteries, paraspinal tissues: Negative. Disc levels: C1-2: Unremarkable. C2-3: No spinal canal stenosis. C3-4: Intermediate sized central disc protrusion with ligamentum flavum redundancy causing severe spinal canal stenosis and cord compression. C4-5: Severe spinal canal stenosis with flattening of the spinal cord due to central disc protrusion. C5-6: Bilateral uncovertebral hypertrophy and small disc bulge with severe left foraminal stenosis. C6-7: Mild disc bulge without spinal canal stenosis. C7-T1: Normal. MRI THORACIC SPINE FINDINGS Alignment:  Physiologic. Vertebrae: No fracture, evidence of discitis, or bone lesion. Cord:  Normal signal and morphology. Paraspinal and other soft tissues: Negative. Disc levels: There is a small central disc protrusion at T7-8 without spinal canal stenosis. MRI LUMBAR SPINE FINDINGS Segmentation:  Normal Alignment:  Normal Vertebrae: There is mild discogenic endplate signal change at L5-S1. Conus medullaris and cauda equina: Conus extends to the L1 level. There is buckling of the cauda equina nerve roots. Paraspinal and other soft tissues: Negative Disc levels: L1-2: Normal. L2-3: Intermediate disc bulge with moderate spinal canal stenosis. No neural foraminal stenosis. L3-4: Large disc bulge, worst in the right subarticular region. Severe spinal canal stenosis with crowding of the cauda equina nerve roots. No neural foraminal stenosis. L4-5: Right subarticular disc protrusion with narrowing of the right subarticular recess. Mild right foraminal stenosis. No  central spinal canal stenosis. L5-S1: Small central disc protrusion without spinal canal or neural foraminal stenosis. IMPRESSION: 1. Severe spinal canal stenosis at C3-4 and C4-5 with cord compression and mild hyperintense T2-weighted signal in the spinal cord at both levels, which may indicate edema or myelomalacia. 2. Severe L3-4 and moderate L2-3 spinal canal stenosis with crowding of the cauda equina nerve roots. 3. Unremarkable thoracic spine. Critical Value/emergent results were called by telephone at the time of interpretation on 04/05/2019 at 11:53 pm to Dr. Marjean Donna , who verbally acknowledged these results. Electronically Signed   By: Ulyses Jarred M.D.   On: 04/05/2019 23:55   Dg Pelvis Portable  Result Date: 04/05/2019 CLINICAL DATA:  Fall. EXAM: PORTABLE PELVIS 1-2 VIEWS COMPARISON:  No prior. FINDINGS: Degenerative changes lumbar spine and both hips. No acute bony or joint abnormality identified. No evidence of fracture dislocation. Aortoiliac atherosclerotic vascular calcification. IMPRESSION: Degenerative change lumbar spine and both hips. No acute abnormality. Electronically Signed   By: Marcello Moores  Register   On: 04/05/2019 16:23   Dg Chest Portable 1 View  Result Date: 04/05/2019 CLINICAL DATA:  Fall, weakness EXAM: PORTABLE CHEST 1 VIEW COMPARISON:  Radiograph 02/22/2019 FINDINGS: Basilar atelectasis. No consolidation, features of edema, pneumothorax, or effusion. Pulmonary vascularity is normally distributed. The cardiomediastinal contours are unremarkable. No  acute osseous or soft tissue abnormality. IMPRESSION: Basilar atelectasis, otherwise no acute cardiopulmonary abnormality or traumatic findings in the chest. Electronically Signed   By: Lovena Le M.D.   On: 04/05/2019 16:24    ____________________________________________   PROCEDURES  Procedure(s) performed (including Critical Care):  Procedures   ____________________________________________   INITIAL IMPRESSION  / ASSESSMENT AND PLAN / ED COURSE  Philip Garrett was evaluated in Emergency Department on 04/05/2019 for the symptoms described in the history of present illness. He was evaluated in the context of the global COVID-19 pandemic, which necessitated consideration that the patient might be at risk for infection with the SARS-CoV-2 virus that causes COVID-19. Institutional protocols and algorithms that pertain to the evaluation of patients at risk for COVID-19 are in a state of rapid change based on information released by regulatory bodies including the CDC and federal and state organizations. These policies and algorithms were followed during the patient's care in the ED.    Patient is a 85 year old gentleman coming from assisted living for concerns for a fall.  Get CT head to evaluate for intracranial hemorrhage.  CT cervical to evaluate for cervical fracture.  No abdominal pain or chest wall pain to suggest rib fractures or abdominal injuries.  Will get basic labs to evaluate for electrolyte abnormalities, AKI, ACS that could be a cause of his weakness.  Patient seems to have good strength in his legs.  No evidence of stroke upon examination.  Labs are reassuring with hemoglobin around baseline.  No evidence of hyper kalemia.  Some hypomagnesium and will replete with some mag.  Initial troponin is 7.   This x-ray without evidence of rib fractures.  Pelvic x-ray without evidence of hip fracture which I have low suspicion for given patient was able to lift both his legs does not seem to have tenderness on his hips..  CT head was negative for acute abnormalities.  Nurse discussed with patient's facility who said at baseline they have to help patient get into a wheelchair and then he uses a wheelchair pretty much at baseline.  8:42 PM callback from the facility who says that over the past 2 months has had increasing weakness where he is needing to use a wheelchair.  She also noted some urinary incontinence  which is new for patient.  I reexamined patient he continues to have 5 out of 5 strength in his legs.  He is able to pull himself up out of bed but when he attempts to stand up his legs get wobbly and he collapsed back down onto the bed.  Post void bladder scan does show some concern for urinary retention with 700 cc of urine in it. No CTL spine tenderness but given the new inability to ambulate with urinary retention will get MRI to further evaluate.   MRI is concerning for severe spinal canal stenosis at C3/C4 and C4/C5 with cord compression.  Is a little bit atypical given patient has good leg strength and arm strength but given the retention in the new MRI finding will need to discuss with neurosurgery for further evaluation.  Discussed with patient's daughter who is at bedside and they would prefer to be transferred to [facility] given there are no on-call neurosurgeons at [facility].  Discussed with [facility] and they will transfer patient to the ED for neurosurgery evaluation under trauma given patient had a fall.     ____________________________________________   FINAL CLINICAL IMPRESSION(S) / ED DIAGNOSES   Final diagnoses:  Fall, initial  encounter  Cervical spinal cord compression ([place])      MEDICATIONS GIVEN DURING THIS VISIT:  Medications  magnesium sulfate IVPB 2 g 50 mL (0 g Intravenous Stopped 04/05/19 1923)     ED Discharge Orders    None       Note:  This document was prepared using Dragon voice recognition software and may include unintentional dictation errors.   Vanessa [place], MD 04/05/19 2022    Vanessa [place], MD 04/06/19 Benancio Deeds

## 2019-04-05 NOTE — ED Notes (Addendum)
Pt able to stand for approx 20 seconds with +2 assist before legs give out. Bladder scan done per EDP: 663mL. EDP at bedside during scan, orders to be placed for MRI of spine. Family updated.

## 2019-04-05 NOTE — ED Notes (Signed)
Patient transported to CT 

## 2019-04-05 NOTE — Discharge Instructions (Signed)
Work-up was reassuring except for slightly low magnesium which we have repleted.  CT scans of his head and neck were negative we also did a chest x-ray of his chest and pelvis.  We called the facility and it sounds like he is at baseline at this time.  No other indications for admission.   Return to the ER for recurrent fall or any other concerns

## 2019-04-12 ENCOUNTER — Inpatient Hospital Stay
Admission: EM | Admit: 2019-04-12 | Discharge: 2019-04-19 | DRG: 870 | Disposition: A | Discharge status: Hospice | Payer: Medicare Other | Attending: Internal Medicine | Admitting: Internal Medicine

## 2019-04-12 ENCOUNTER — Other Ambulatory Visit: Payer: Self-pay

## 2019-04-12 ENCOUNTER — Inpatient Hospital Stay: Payer: Medicare Other

## 2019-04-12 ENCOUNTER — Emergency Department: Payer: Medicare Other

## 2019-04-12 DIAGNOSIS — E860 Dehydration: Secondary | ICD-10-CM | POA: Diagnosis present

## 2019-04-12 DIAGNOSIS — E1169 Type 2 diabetes mellitus with other specified complication: Secondary | ICD-10-CM | POA: Diagnosis not present

## 2019-04-12 DIAGNOSIS — E875 Hyperkalemia: Secondary | ICD-10-CM | POA: Diagnosis present

## 2019-04-12 DIAGNOSIS — A419 Sepsis, unspecified organism: Principal | ICD-10-CM | POA: Diagnosis present

## 2019-04-12 DIAGNOSIS — I2583 Coronary atherosclerosis due to lipid rich plaque: Secondary | ICD-10-CM

## 2019-04-12 DIAGNOSIS — I130 Hypertensive heart and chronic kidney disease with heart failure and stage 1 through stage 4 chronic kidney disease, or unspecified chronic kidney disease: Secondary | ICD-10-CM | POA: Diagnosis present

## 2019-04-12 DIAGNOSIS — Z4659 Encounter for fitting and adjustment of other gastrointestinal appliance and device: Secondary | ICD-10-CM

## 2019-04-12 DIAGNOSIS — E861 Hypovolemia: Secondary | ICD-10-CM | POA: Diagnosis present

## 2019-04-12 DIAGNOSIS — N170 Acute kidney failure with tubular necrosis: Secondary | ICD-10-CM | POA: Diagnosis not present

## 2019-04-12 DIAGNOSIS — Z515 Encounter for palliative care: Secondary | ICD-10-CM | POA: Diagnosis not present

## 2019-04-12 DIAGNOSIS — N179 Acute kidney failure, unspecified: Secondary | ICD-10-CM | POA: Diagnosis present

## 2019-04-12 DIAGNOSIS — N390 Urinary tract infection, site not specified: Secondary | ICD-10-CM | POA: Diagnosis present

## 2019-04-12 DIAGNOSIS — D631 Anemia in chronic kidney disease: Secondary | ICD-10-CM | POA: Diagnosis present

## 2019-04-12 DIAGNOSIS — J9601 Acute respiratory failure with hypoxia: Secondary | ICD-10-CM | POA: Diagnosis not present

## 2019-04-12 DIAGNOSIS — Z89422 Acquired absence of other left toe(s): Secondary | ICD-10-CM

## 2019-04-12 DIAGNOSIS — E871 Hypo-osmolality and hyponatremia: Secondary | ICD-10-CM | POA: Diagnosis not present

## 2019-04-12 DIAGNOSIS — N184 Chronic kidney disease, stage 4 (severe): Secondary | ICD-10-CM | POA: Diagnosis present

## 2019-04-12 DIAGNOSIS — I5032 Chronic diastolic (congestive) heart failure: Secondary | ICD-10-CM | POA: Diagnosis present

## 2019-04-12 DIAGNOSIS — J9600 Acute respiratory failure, unspecified whether with hypoxia or hypercapnia: Secondary | ICD-10-CM

## 2019-04-12 DIAGNOSIS — G9341 Metabolic encephalopathy: Secondary | ICD-10-CM | POA: Diagnosis present

## 2019-04-12 DIAGNOSIS — E1122 Type 2 diabetes mellitus with diabetic chronic kidney disease: Secondary | ICD-10-CM | POA: Diagnosis present

## 2019-04-12 DIAGNOSIS — I252 Old myocardial infarction: Secondary | ICD-10-CM

## 2019-04-12 DIAGNOSIS — E440 Moderate protein-calorie malnutrition: Secondary | ICD-10-CM | POA: Diagnosis present

## 2019-04-12 DIAGNOSIS — E162 Hypoglycemia, unspecified: Secondary | ICD-10-CM | POA: Diagnosis present

## 2019-04-12 DIAGNOSIS — E785 Hyperlipidemia, unspecified: Secondary | ICD-10-CM | POA: Diagnosis present

## 2019-04-12 DIAGNOSIS — Z6829 Body mass index (BMI) 29.0-29.9, adult: Secondary | ICD-10-CM

## 2019-04-12 DIAGNOSIS — E872 Acidosis, unspecified: Secondary | ICD-10-CM

## 2019-04-12 DIAGNOSIS — T383X5A Adverse effect of insulin and oral hypoglycemic [antidiabetic] drugs, initial encounter: Secondary | ICD-10-CM | POA: Diagnosis present

## 2019-04-12 DIAGNOSIS — L89301 Pressure ulcer of unspecified buttock, stage 1: Secondary | ICD-10-CM | POA: Diagnosis not present

## 2019-04-12 DIAGNOSIS — Z66 Do not resuscitate: Secondary | ICD-10-CM | POA: Diagnosis present

## 2019-04-12 DIAGNOSIS — R6521 Severe sepsis with septic shock: Secondary | ICD-10-CM | POA: Diagnosis present

## 2019-04-12 DIAGNOSIS — Z7902 Long term (current) use of antithrombotics/antiplatelets: Secondary | ICD-10-CM

## 2019-04-12 DIAGNOSIS — I10 Essential (primary) hypertension: Secondary | ICD-10-CM | POA: Diagnosis not present

## 2019-04-12 DIAGNOSIS — B9620 Unspecified Escherichia coli [E. coli] as the cause of diseases classified elsewhere: Secondary | ICD-10-CM | POA: Diagnosis present

## 2019-04-12 DIAGNOSIS — Z720 Tobacco use: Secondary | ICD-10-CM

## 2019-04-12 DIAGNOSIS — T17908A Unspecified foreign body in respiratory tract, part unspecified causing other injury, initial encounter: Secondary | ICD-10-CM

## 2019-04-12 DIAGNOSIS — Z79899 Other long term (current) drug therapy: Secondary | ICD-10-CM

## 2019-04-12 DIAGNOSIS — I248 Other forms of acute ischemic heart disease: Secondary | ICD-10-CM | POA: Diagnosis present

## 2019-04-12 DIAGNOSIS — Z833 Family history of diabetes mellitus: Secondary | ICD-10-CM

## 2019-04-12 DIAGNOSIS — Z20828 Contact with and (suspected) exposure to other viral communicable diseases: Secondary | ICD-10-CM | POA: Diagnosis present

## 2019-04-12 DIAGNOSIS — G9520 Unspecified cord compression: Secondary | ICD-10-CM | POA: Diagnosis present

## 2019-04-12 DIAGNOSIS — Z9861 Coronary angioplasty status: Secondary | ICD-10-CM

## 2019-04-12 DIAGNOSIS — R0602 Shortness of breath: Secondary | ICD-10-CM

## 2019-04-12 DIAGNOSIS — Z993 Dependence on wheelchair: Secondary | ICD-10-CM

## 2019-04-12 DIAGNOSIS — J9602 Acute respiratory failure with hypercapnia: Secondary | ICD-10-CM | POA: Diagnosis not present

## 2019-04-12 DIAGNOSIS — Z1611 Resistance to penicillins: Secondary | ICD-10-CM | POA: Diagnosis present

## 2019-04-12 DIAGNOSIS — Z8673 Personal history of transient ischemic attack (TIA), and cerebral infarction without residual deficits: Secondary | ICD-10-CM

## 2019-04-12 DIAGNOSIS — R7401 Elevation of levels of liver transaminase levels: Secondary | ICD-10-CM | POA: Diagnosis not present

## 2019-04-12 DIAGNOSIS — I2510 Atherosclerotic heart disease of native coronary artery without angina pectoris: Secondary | ICD-10-CM

## 2019-04-12 DIAGNOSIS — F0150 Vascular dementia without behavioral disturbance: Secondary | ICD-10-CM | POA: Diagnosis not present

## 2019-04-12 DIAGNOSIS — Z794 Long term (current) use of insulin: Secondary | ICD-10-CM

## 2019-04-12 DIAGNOSIS — M4802 Spinal stenosis, cervical region: Secondary | ICD-10-CM | POA: Diagnosis present

## 2019-04-12 DIAGNOSIS — N200 Calculus of kidney: Secondary | ICD-10-CM | POA: Diagnosis present

## 2019-04-12 DIAGNOSIS — Z7189 Other specified counseling: Secondary | ICD-10-CM | POA: Diagnosis not present

## 2019-04-12 DIAGNOSIS — R112 Nausea with vomiting, unspecified: Secondary | ICD-10-CM

## 2019-04-12 HISTORY — DX: Spinal stenosis, cervical region: M48.02

## 2019-04-12 LAB — COMPREHENSIVE METABOLIC PANEL
ALT: 48 U/L — ABNORMAL HIGH (ref 0–44)
AST: 87 U/L — ABNORMAL HIGH (ref 15–41)
Albumin: 3.1 g/dL — ABNORMAL LOW (ref 3.5–5.0)
Alkaline Phosphatase: 68 U/L (ref 38–126)
Anion gap: 25 — ABNORMAL HIGH (ref 5–15)
BUN: 60 mg/dL — ABNORMAL HIGH (ref 8–23)
CO2: 11 mmol/L — ABNORMAL LOW (ref 22–32)
Calcium: 8.9 mg/dL (ref 8.9–10.3)
Chloride: 95 mmol/L — ABNORMAL LOW (ref 98–111)
Creatinine, Ser: 6.96 mg/dL — ABNORMAL HIGH (ref 0.61–1.24)
GFR calc Af Amer: 8 mL/min — ABNORMAL LOW (ref >60)
GFR calc non Af Amer: 7 mL/min — ABNORMAL LOW (ref >60)
Glucose, Bld: 140 mg/dL — ABNORMAL HIGH (ref 70–99)
Potassium: 5.8 mmol/L — ABNORMAL HIGH (ref 3.5–5.1)
Sodium: 131 mmol/L — ABNORMAL LOW (ref 135–145)
Total Bilirubin: 0.7 mg/dL (ref 0.3–1.2)
Total Protein: 7.1 g/dL (ref 6.5–8.1)

## 2019-04-12 LAB — CBC WITH DIFFERENTIAL/PLATELET
Abs Immature Granulocytes: 0.08 10*3/uL — ABNORMAL HIGH (ref 0.00–0.07)
Basophils Absolute: 0 10*3/uL (ref 0.0–0.1)
Basophils Relative: 0 %
Eosinophils Absolute: 0 10*3/uL (ref 0.0–0.5)
Eosinophils Relative: 0 %
HCT: 31 % — ABNORMAL LOW (ref 39.0–52.0)
Hemoglobin: 9.9 g/dL — ABNORMAL LOW (ref 13.0–17.0)
Immature Granulocytes: 1 %
Lymphocytes Relative: 9 %
Lymphs Abs: 0.8 10*3/uL (ref 0.7–4.0)
MCH: 27.7 pg (ref 26.0–34.0)
MCHC: 31.9 g/dL (ref 30.0–36.0)
MCV: 86.8 fL (ref 80.0–100.0)
Monocytes Absolute: 1.1 10*3/uL — ABNORMAL HIGH (ref 0.1–1.0)
Monocytes Relative: 12 %
Neutro Abs: 7 10*3/uL (ref 1.7–7.7)
Neutrophils Relative %: 78 %
Platelets: 259 10*3/uL (ref 150–400)
RBC: 3.57 MIL/uL — ABNORMAL LOW (ref 4.22–5.81)
RDW: 15.4 % (ref 11.5–15.5)
WBC: 9 10*3/uL (ref 4.0–10.5)
nRBC: 0 % (ref 0.0–0.2)

## 2019-04-12 LAB — URINALYSIS, COMPLETE (UACMP) WITH MICROSCOPIC
Bilirubin Urine: NEGATIVE
Glucose, UA: NEGATIVE mg/dL
Ketones, ur: NEGATIVE mg/dL
Nitrite: NEGATIVE
Protein, ur: 100 mg/dL — AB
RBC / HPF: >50 RBC/hpf — ABNORMAL HIGH (ref 0–5)
Specific Gravity, Urine: 1.011 (ref 1.005–1.030)
Squamous Epithelial / HPF: NONE SEEN (ref 0–5)
WBC, UA: >50 WBC/hpf — ABNORMAL HIGH (ref 0–5)
pH: 6 (ref 5.0–8.0)

## 2019-04-12 LAB — LACTIC ACID, PLASMA
Lactic Acid, Venous: 9.6 mmol/L (ref 0.5–1.9)
Lactic Acid, Venous: 10.7 mmol/L (ref 0.5–1.9)

## 2019-04-12 LAB — GLUCOSE, CAPILLARY
Glucose-Capillary: 169 mg/dL — ABNORMAL HIGH (ref 70–99)
Glucose-Capillary: 62 mg/dL — ABNORMAL LOW (ref 70–99)

## 2019-04-12 LAB — TROPONIN I (HIGH SENSITIVITY)
Troponin I (High Sensitivity): 43 ng/L — ABNORMAL HIGH (ref <18)
Troponin I (High Sensitivity): 38 ng/L — ABNORMAL HIGH (ref <18)

## 2019-04-12 LAB — SARS CORONAVIRUS 2 (TAT 6-24 HRS): SARS Coronavirus 2: NEGATIVE

## 2019-04-12 MED ORDER — OXYCODONE HCL 5 MG PO TABS: 5.0000 mg | ORAL_TABLET | ORAL | Status: DC | PRN

## 2019-04-12 MED ORDER — ACETAMINOPHEN 325 MG PO TABS
650.0000 mg | ORAL_TABLET | Freq: Four times a day (QID) | ORAL | Status: DC | PRN
  Administered 2019-04-17: 650 mg via ORAL
  Filled 2019-04-12: qty 2

## 2019-04-12 MED ORDER — SODIUM CHLORIDE 0.9 % IV SOLN
Freq: Once | INTRAVENOUS | Status: AC
  Administered 2019-04-12: 18:00:00 via INTRAVENOUS

## 2019-04-12 MED ORDER — ONDANSETRON HCL 4 MG PO TABS: 4.0000 mg | ORAL_TABLET | Freq: Four times a day (QID) | ORAL | Status: DC | PRN

## 2019-04-12 MED ORDER — SODIUM CHLORIDE 0.9 % IV SOLN
1.0000 g | Freq: Once | INTRAVENOUS | Status: AC
  Administered 2019-04-12: 15:00:00 1 g via INTRAVENOUS
  Filled 2019-04-12: qty 10

## 2019-04-12 MED ORDER — IOHEXOL 9 MG/ML PO SOLN
500.0000 mL | ORAL | Status: AC
  Administered 2019-04-12 (×2): 500 mL via ORAL

## 2019-04-12 MED ORDER — INSULIN ASPART 100 UNIT/ML ~~LOC~~ SOLN
0.0000 [IU] | Freq: Three times a day (TID) | SUBCUTANEOUS | Status: DC
  Administered 2019-04-13: 5 [IU] via SUBCUTANEOUS
  Filled 2019-04-12: qty 1

## 2019-04-12 MED ORDER — DEXTROSE 50 % IV SOLN
INTRAVENOUS | Status: AC
  Administered 2019-04-12
  Filled 2019-04-12: qty 50

## 2019-04-12 MED ORDER — ONDANSETRON HCL 4 MG/2ML IJ SOLN
4.0000 mg | Freq: Once | INTRAMUSCULAR | Status: AC
  Administered 2019-04-12: 13:00:00 4 mg via INTRAVENOUS
  Filled 2019-04-12: qty 2

## 2019-04-12 MED ORDER — DEXTROSE 50 % IV SOLN
1.0000 | Freq: Once | INTRAVENOUS | Status: AC
  Administered 2019-04-12: 23:00:00 50 mL via INTRAVENOUS

## 2019-04-12 MED ORDER — ENOXAPARIN SODIUM 30 MG/0.3ML ~~LOC~~ SOLN: 30.0000 mg | SUBCUTANEOUS | Status: DC

## 2019-04-12 MED ORDER — SODIUM CHLORIDE 0.9 % IV SOLN
Freq: Once | INTRAVENOUS | Status: AC
  Administered 2019-04-12: 13:00:00 via INTRAVENOUS

## 2019-04-12 MED ORDER — ACETAMINOPHEN 650 MG RE SUPP: 650.0000 mg | Freq: Four times a day (QID) | RECTAL | Status: DC | PRN

## 2019-04-12 MED ORDER — CLOPIDOGREL BISULFATE 75 MG PO TABS
75.0000 mg | ORAL_TABLET | Freq: Every day | ORAL | Status: DC
  Administered 2019-04-14 – 2019-04-17 (×4): 75 mg via ORAL
  Filled 2019-04-12 (×4): qty 1

## 2019-04-12 MED ORDER — SODIUM CHLORIDE 0.9 % IV SOLN
INTRAVENOUS | Status: DC
  Administered 2019-04-13: 02:00:00 via INTRAVENOUS

## 2019-04-12 MED ORDER — SODIUM CHLORIDE 0.9 % IV SOLN
Freq: Once | INTRAVENOUS | Status: AC
  Administered 2019-04-12: 15:00:00 via INTRAVENOUS

## 2019-04-12 MED ORDER — SODIUM CHLORIDE 0.9 % IV BOLUS
1000.0000 mL | Freq: Once | INTRAVENOUS | Status: AC
  Administered 2019-04-13: 1000 mL via INTRAVENOUS

## 2019-04-12 MED ORDER — SODIUM CHLORIDE 0.9 % IV SOLN
1.0000 g | INTRAVENOUS | Status: DC
  Administered 2019-04-13 – 2019-04-15 (×3): 1 g via INTRAVENOUS
  Filled 2019-04-12: qty 1
  Filled 2019-04-12: qty 10
  Filled 2019-04-12: qty 1

## 2019-04-12 MED ORDER — ONDANSETRON HCL 4 MG/2ML IJ SOLN: 4.0000 mg | Freq: Four times a day (QID) | INTRAMUSCULAR | Status: DC | PRN

## 2019-04-12 MED ORDER — INSULIN ASPART 100 UNIT/ML ~~LOC~~ SOLN
0.0000 [IU] | Freq: Every day | SUBCUTANEOUS | Status: DC
  Filled 2019-04-12: qty 1

## 2019-04-12 NOTE — ED Notes (Signed)
Ramir [id number] Pt niece updated at this time

## 2019-04-12 NOTE — ED Triage Notes (Signed)
Pt arrives via EMS from [facility]. Pt states he has been vomiting for about a week. Nurse at facility states it started this morning. Pt states "he's had a little diarrhea and coughing stuff up". Pt states he's had a slight fever that comes and goes. Pt denies headaches.  T 97.4 oral BS 141 O2 99% room air HR 74 BP 92/49

## 2019-04-12 NOTE — ED Provider Notes (Signed)
EKG: Interpreted by me, sinus rhythm with a rate of 71 bpm, borderline wide QRS, possible old infarct age-indeterminate, normal QT   Earleen Newport, MD 04/12/19 1535

## 2019-04-12 NOTE — H&P (Addendum)
History and Physical    DOA: 04/12/2019  PCP: Jodi Marble, MD  Patient coming from: [facility] SNF  Chief Complaint: Vague symptoms-?nausea/diarrhea/low grade fever  HPI: Philip Garrett is a 85 y.o. male with history h/o HTN, hyperlipidemia, CVA on aspirin/Plavix, CAD/MI, CHF, IDDM sent from SNF today in concern for decline in general condition. Per RN at [facility], patient had arrived on Monday this week, had had several days decline, weaker and weaker, today was sluggish/confused, had BP 88/57, and vomited.   Per ED physician there were some concerns for lethargy, intermittent low grade temp, cough/nausea/vomiting and diarrhea.    In the ER, Cr 6.96 mg/dL (up from 1.1 earlier this week).  BUN 60, K 5.8, Bicarb 11.  AG 25.  Glucose 140.  AST/ALT 87/48.  WBC normal.  CT head and c-spine unremarkable except for degenerative disk disease.  Foley catheter was placed with return of 250 mL of discolored turbid dark urine, many RBCs and WBCs and abcteria on microscopy.   Case was discussed with nephrology who recommended IV hydration/renal ultrasound and admission to hospitalist service. Patient received 2 L IV fluids and 1 g IV Rocephin in the ED.    Of note, patient has lived in assisted living ([place]) for years.  For the last several montsh, has been much weaker, requiring a wheelchair for mobility, no obvious explanation why.  Recently had been having neck pain and falls.  Was evaluated in this ER over the weekend, transferred to [facility] ER where he was evaluated by Neurosurgery and scheduled with them as an outpatient (with Dr. Nikki Dom of [place]) with SNF acute rehab in the meantime at [facility], where he has been since Monday.       Review of Systems:  Review of Systems  Unable to perform ROS: Mental status change  Constitutional: Positive for fever and malaise/fatigue.  Gastrointestinal: Positive for nausea and vomiting.      Past Medical History:    Diagnosis Date   CHF (congestive heart failure) (HCC)    Coronary artery disease    Diabetes mellitus without complication ([place])    Hyperlipidemia    Hypertension    Myocardial infarction [facility])    Stroke [facility])    TIA    Past Surgical History:  Procedure Laterality Date   AMPUTATION TOE Left 10/30/2015   Procedure: AMPUTATION LEFT SECOND TOE/mpj left 2nd;  Surgeon: Sharlotte Alamo, DPM;  Location: [facility] ORS;  Service: Podiatry;  Laterality: Left;   CORONARY ANGIOPLASTY     TOE AMPUTATION Left    great toe    Social history:  reports that he has never smoked. His smokeless tobacco use includes snuff and chew. He reports that he does not drink alcohol or use drugs.   No Known Allergies  Family History  Problem Relation Age of Onset   Diabetes Other       Prior to Admission medications   Medication Sig Start Date End Date Taking? Authorizing Provider  acetaminophen (TYLENOL) 500 MG tablet Take 1,000 mg by mouth every 4 (four) hours as needed for mild pain or moderate pain.    Yes [provider]  amLODipine (NORVASC) 5 MG tablet Take 5 mg by mouth daily.    Yes [provider]  atorvastatin (LIPITOR) 20 MG tablet Take 20 mg by mouth at bedtime.    Yes [provider]  carvedilol (COREG) 25 MG tablet Take 25 mg by mouth 2 (two) times daily with a meal.  Yes [provider]  clopidogrel (PLAVIX) 75 MG tablet Take 75 mg by mouth daily.   Yes [provider]  fluticasone (FLONASE) 50 MCG/ACT nasal spray Place 1 spray into both nostrils daily.   Yes [provider]  insulin aspart (NOVOLOG) 100 UNIT/ML injection Inject 0-10 Units into the skin See admin instructions. Inject under the skin before meals according to sliding scale:  <150: 0u 150-200: 2u 201-250: 4u 251-300: 6u 301-350: 8u 351-400: 10u   Yes [provider]  Insulin Detemir (LEVEMIR FLEXTOUCH) 100 UNIT/ML Pen Inject 10 Units into the skin  daily.    Yes [provider]  latanoprost (XALATAN) 0.005 % ophthalmic solution Place 1 drop into both eyes at bedtime.   Yes [provider]  linagliptin (TRADJENTA) 5 MG TABS tablet Take 5 mg by mouth daily.   Yes [provider]  loratadine (CLARITIN) 10 MG tablet Take 10 mg by mouth daily as needed for allergies.    Yes [provider]  metFORMIN (GLUCOPHAGE) 1000 MG tablet Take 1,000 mg by mouth 2 (two) times daily. 04/01/19  Yes [provider]  montelukast (SINGULAIR) 10 MG tablet Take 10 mg by mouth daily.    Yes [provider]  ramipril (ALTACE) 10 MG capsule Take 10 mg by mouth daily.   Yes [provider]  diclofenac sodium (VOLTAREN) 1 % GEL Apply 4 g topically 4 (four) times daily. Patient not taking: Reported on 04/12/2019 02/22/19   Blake Divine, MD  PARoxetine (PAXIL) 10 MG tablet Take 10 mg by mouth daily.    [provider]    Physical Exam: Vitals:   04/12/19 1148 04/12/19 1300 04/12/19 1400 04/12/19 1530  BP:  (!) 103/48 107/62 (!) 111/57  Pulse:  72    Resp:  (!) 26  20  Temp:      TempSrc:      SpO2:  100%    Weight: 127 kg     Height: 6\' 4"  (1.93 m)       General appearance: elderly adult male, sleeping but rousable, in no obvious distress.   HEENT: Anicteric, conjunctiva pink, lids and lashes normal. No nasal deformity, discharge, epistaxis.  Lips moist, teeth normal. OP dry, no oral lesions.  Hearing normal Skin: Warm and dry.  No suspicious rashes or lesions. Cardiac: RRR, no murmurs appreciated.  JVP not visible.  No LE edema.    Respiratory: Normal respiratory rate and rhythm.  CTAB without rales or wheezes. Abdomen: Abdomen soft.  No TTP or gaurding. No ascites, distension, hepatosplenomegaly.   MSK: No deformities or effusions of the large joints of the upper or lower extremities bilaterally. Neuro: Awake but sluggish. Able to state he is at a hospital after much prompting, states  he has had a fever for months.  States that he has neck pain, otherwise he is disoriented and weak.  Moves both upper extremities with very weak strength, slow coordination.  Speech hypophonic. Muscle tone diminished. Psych: Affect blunted, judgment and insight impaired.  Attention diminished.      Labs on Admission: I have personally reviewed following labs and imaging studies  CBC: Recent Labs  Lab 04/12/19 1154  WBC 9.0  NEUTROABS 7.0  HGB 9.9*  HCT 31.0*  MCV 86.8  PLT [id number]   Basic Metabolic Panel: Recent Labs  Lab 04/12/19 1154  NA 131*  K 5.8*  CL 95*  CO2 11*  GLUCOSE 140*  BUN 60*  CREATININE 6.96*  CALCIUM 8.9  GFR: Estimated Creatinine Clearance: 11.3 mL/min (A) (by C-G formula based on SCr of 6.96 mg/dL (H)). Liver Function Tests: Recent Labs  Lab 04/12/19 1154  AST 87*  ALT 48*  ALKPHOS 68  BILITOT 0.7  PROT 7.1  ALBUMIN 3.1*   Urine analysis:    Component Value Date/Time   COLORURINE YELLOW (A) 04/12/2019 1418   APPEARANCEUR TURBID (A) 04/12/2019 1418   LABSPEC 1.011 04/12/2019 1418   PHURINE 6.0 04/12/2019 1418   GLUCOSEU NEGATIVE 04/12/2019 1418   HGBUR MODERATE (A) 04/12/2019 1418   BILIRUBINUR NEGATIVE 04/12/2019 1418   KETONESUR NEGATIVE 04/12/2019 1418   PROTEINUR 100 (A) 04/12/2019 1418   NITRITE NEGATIVE 04/12/2019 1418   LEUKOCYTESUR MODERATE (A) 04/12/2019 1418    Radiological Exams on Admission: Personally reviewed CXR shows no focal opacity or edema. Dg Abdomen Acute W/chest  Result Date: 04/12/2019 CLINICAL DATA:  Vomiting, diarrhea. EXAM: DG ABDOMEN ACUTE W/ 1V CHEST COMPARISON:  01/20/2016 FINDINGS: Lungs are clear. Heart size is normal. No free air beneath either right or left hemidiaphragm. Stool and gas throughout the colon. No signs of bowel obstruction. No acute bone process. IMPRESSION: No acute cardiopulmonary disease. No acute intra-abdominal finding. Electronically Signed   By: Zetta Bills M.D.   On: 04/12/2019  12:49   MRI lumbar spine without contrast done at [facility] on 04/06/2019 Multilevel degenerative disease as described with moderate to severe  overall spinal stenosis L3-4, multifactorial, related to combination of  diffuse disc bulge and epidural lipomatosis.  2. Moderate to severe degenerative disc disease at L5-S1 preferentially  affecting the left side with irregularity and sclerosis along the endplates  and moderate left-sided neural foraminal stenosis.   MRI cervical spine on 04/06/2019  Severe spinal canal stenosis and bilateral neural foraminal stenosis at  C3-4 and C4-5 as described.      EKG: Independently reviewed. Normal rate, no ST changes, no peaked T waves.        Assessment and Plan:   Acute renal failure Hyperkalemia Elevated anion gap metabolic acidosis Differential is broad -Obtain renal ultrasound -IV fluids -Consult nephrology, appreciate cares -Strict ins and outs -Avoid hypotension -Hold ramipril, Metformin   Acute metabolic encephalopathy Presumably this is from renal failure.  There are no focal neurological deficits  Transaminitis Unclear cause.  No RUQ pain, suspect this is ischemic.   -Trend LFTs -Obtain RUQ [place]  Possible UTI Possible COVID Patient comes from a facility with large outbreak of Covid. -Maintain as PUI with airborne precautions -Follow Covid test Continue ceftriaxone -Follow blood cultures and urine culture  Lactic acidosis Suspect this is from renal failure and metforminemia.  Chronic diastolic CHF Hypertension Coronary and cerebrovascular disease secondary prevention Appears dehydrated.  Blood pressure low. -Hold amlodipine, ramipril, Coreg until blood pressure normalizes -Hold statin given transaminitis  Diabetes Glucose normal here, doubt DKA -Sliding scale correction insulin -Hold Metformin, linagliptin, and Levemir for now  Cervical cord compression syndrome Has follow-up with [facility], Dr. Archie Balboa  in 1 to 2 weeks -PT eval when able  Anemia Stable relative to baseline       DVT prophylaxis: Low-dose Lovenox  COVID screen: Pending  Code Status: Full code, confirmed with facility.Health care proxy would be niece, listed in chart  Patient/Family Communication: Discussed with patient and all questions answered to satisfaction.  Consults called: Nephrology Admission status : Inpatient Expected LOS: Awake       [place]   If 7PM-7AM, please contact night-coverage [email or web address] Password [facility]  04/12/2019, 4:06  PM         At the time of admission, it appears that the appropriate admission status for this patient is INPATIENT. This is judged to be reasonable and necessary in order to provide the required intensity of service to ensure the patient's safety given: -presenting symptoms of confusion -physical exam findings of hypotension, and  -initial radiographic and laboratory data renal failure, acidosis, hyperkalemia -in the context of their chronic comorbidities diabetes, hypertension    Together, these circumstances are felt to place him at high risk for further clinical deterioration threatening life, limb, or organ requiring a high intensity of service due to this acute illness that poses a threat to life, limb or bodily function.  I certify that at the point of admission it is my clinical judgment that the patient will require inpatient hospital care spanning beyond 2 midnights from the point of admission and that early discharge would result in unnecessary risk of decompensation and readmission or threat to life, limb or bodily function.

## 2019-04-12 NOTE — ED Provider Notes (Signed)
[facility] Emergency Department Provider Note       Time seen: ----------------------------------------- 11:48 AM on 04/12/2019 -----------------------------------------   I have reviewed the triage vital signs and the nursing notes.  HISTORY   Chief Complaint Emesis    HPI Philip Garrett is a 85 y.o. male with a history of CHF, coronary disease, diabetes, hyperlipidemia, hypertension, MI, CVA, bowel obstruction who presents to the ED for vomiting for about a week.  Patient arrives from [place].  Nurse at the facility states that started this morning.  He has had some diarrhea and has been coughing.  They report a slight fever that comes and goes.  He denies any headaches.  Past Medical History:  Diagnosis Date  . CHF (congestive heart failure) ([place])   . Coronary artery disease   . Diabetes mellitus without complication ([place])   . Hyperlipidemia   . Hypertension   . Myocardial infarction ([place])   . Stroke [facility])    TIA    Patient Active Problem List   Diagnosis Date Noted  . SBO (small bowel obstruction) ([place]) 01/18/2016  . Hypoglycemia 09/14/2015    Past Surgical History:  Procedure Laterality Date  . AMPUTATION TOE Left 10/30/2015   Procedure: AMPUTATION LEFT SECOND TOE/mpj left 2nd;  Surgeon: Sharlotte Alamo, DPM;  Location: [facility] ORS;  Service: Podiatry;  Laterality: Left;  . CORONARY ANGIOPLASTY    . TOE AMPUTATION Left    great toe    Allergies Patient has no known allergies.  Social History Social History   Tobacco Use  . Smoking status: Never Smoker  . Smokeless tobacco: Current User    Types: Snuff, Chew  Substance Use Topics  . Alcohol use: No  . Drug use: No   Review of Systems Constitutional: Positive for reported fever Cardiovascular: Negative for chest pain. Respiratory: Negative for shortness of breath.  Positive for cough Gastrointestinal: Negative for abdominal pain, positive for vomiting and  diarrhea Musculoskeletal: Negative for back pain. Skin: Negative for rash. Neurological: Negative for headaches, focal weakness or numbness.  All systems negative/normal/unremarkable except as stated in the HPI  ____________________________________________   PHYSICAL EXAM:  VITAL SIGNS: ED Triage Vitals  Enc Vitals Group     BP 04/12/19 1147 (!) 103/58     Pulse Rate 04/12/19 1147 73     Resp 04/12/19 1147 (!) 23     Temp 04/12/19 1147 98.7 F (37.1 C)     Temp Source 04/12/19 1147 Oral     SpO2 04/12/19 1147 100 %     Weight 04/12/19 1148 280 lb (127 kg)     Height 04/12/19 1148 6\' 4"  (1.93 m)     Head Circumference --      Peak Flow --      Pain Score 04/12/19 1148 0     Pain Loc --      Pain Edu? --      Excl. in [place]? --    Constitutional: Alert and oriented.  No acute distress Eyes: Conjunctivae are normal. Normal extraocular movements. ENT      Head: Normocephalic and atraumatic.      Nose: No congestion/rhinnorhea.      Mouth/Throat: Mucous membranes are moist.      Neck: No stridor. Cardiovascular: Normal rate, regular rhythm. No murmurs, rubs, or gallops. Respiratory: Normal respiratory effort without tachypnea nor retractions. Breath sounds are clear and equal bilaterally. No wheezes/rales/rhonchi. Gastrointestinal: Soft and nontender. Normal bowel sounds Musculoskeletal: Nontender with normal range  of motion in extremities. No lower extremity tenderness nor edema. Neurologic:  Normal speech and language. No gross focal neurologic deficits are appreciated.  Generalized weakness Skin:  Skin is warm, dry and intact. No rash noted. Psychiatric: Mood and affect are normal. Speech and behavior are normal.  ____________________________________________  ED COURSE:  As part of my medical decision making, I reviewed the following data within the [place] History obtained from family if available, nursing notes, old chart and ekg, as well as notes  from prior ED visits. Patient presented for vomiting, diarrhea, cough and weakness, we will assess with labs and imaging as indicated at this time.   Procedures  Philip Garrett was evaluated in Emergency Department on 04/12/2019 for the symptoms described in the history of present illness. He was evaluated in the context of the global COVID-19 pandemic, which necessitated consideration that the patient might be at risk for infection with the SARS-CoV-2 virus that causes COVID-19. Institutional protocols and algorithms that pertain to the evaluation of patients at risk for COVID-19 are in a state of rapid change based on information released by regulatory bodies including the CDC and federal and state organizations. These policies and algorithms were followed during the patient's care in the ED.  ____________________________________________   LABS (pertinent positives/negatives)  Labs Reviewed  CBC WITH DIFFERENTIAL/PLATELET - Abnormal; Notable for the following components:      Result Value   RBC 3.57 (*)    Hemoglobin 9.9 (*)    HCT 31.0 (*)    Monocytes Absolute 1.1 (*)    Abs Immature Granulocytes 0.08 (*)    All other components within normal limits  COMPREHENSIVE METABOLIC PANEL - Abnormal; Notable for the following components:   Sodium 131 (*)    Potassium 5.8 (*)    Chloride 95 (*)    CO2 11 (*)    Glucose, Bld 140 (*)    BUN 60 (*)    Creatinine, Ser 6.96 (*)    Albumin 3.1 (*)    AST 87 (*)    ALT 48 (*)    GFR calc non Af Amer 7 (*)    GFR calc Af Amer 8 (*)    Anion gap 25 (*)    All other components within normal limits  URINALYSIS, COMPLETE (UACMP) WITH MICROSCOPIC - Abnormal; Notable for the following components:   Color, Urine YELLOW (*)    APPearance TURBID (*)    Hgb urine dipstick MODERATE (*)    Protein, ur 100 (*)    Leukocytes,Ua MODERATE (*)    RBC / HPF >50 (*)    WBC, UA >50 (*)    Bacteria, UA MANY (*)    All other components within normal limits   LACTIC ACID, PLASMA - Abnormal; Notable for the following components:   Lactic Acid, Venous 9.6 (*)    All other components within normal limits  TROPONIN I (HIGH SENSITIVITY) - Abnormal; Notable for the following components:   Troponin I (High Sensitivity) 43 (*)    All other components within normal limits  CULTURE, BLOOD (ROUTINE X 2)  SARS CORONAVIRUS 2 (TAT 6-24 HRS)  URINE CULTURE  CBG MONITORING, ED  TROPONIN I (HIGH SENSITIVITY)   CRITICAL CARE Performed by: Laurence Aly   Total critical care time: 30 minutes  Critical care time was exclusive of separately billable procedures and treating other patients.  Critical care was necessary to treat or prevent imminent or life-threatening deterioration.  Critical care was  time spent personally by me on the following activities: development of treatment plan with patient and/or surrogate as well as nursing, discussions with consultants, evaluation of patient's response to treatment, examination of patient, obtaining history from patient or surrogate, ordering and performing treatments and interventions, ordering and review of laboratory studies, ordering and review of radiographic studies, pulse oximetry and re-evaluation of patient's condition.  RADIOLOGY Images were viewed by me  Acute abdominal series IMPRESSION:  No acute cardiopulmonary disease.   No acute intra-abdominal finding.  ____________________________________________   DIFFERENTIAL DIAGNOSIS   Dehydration, electrolyte abnormality, viral infection, COVID-19, sepsis, bowel obstruction  FINAL ASSESSMENT AND PLAN  Urinary tract infection, acute renal failure, lactic acidosis   Plan: The patient had presented for weakness and viral symptoms. Patient's labs did reveal acute renal failure with potassium of 5.8, BUN of 16 creatinine of 6.96.  Unclear if this is dehydration related.  This could also be complicated by some urinary retention.  He was retaining  about 300 cc of urine.  We did drain his bladder with a Foley catheter and send a urine culture.  He received IV Rocephin. Patient's imaging did not reveal any acute process.  He has not described any new weakness, feels like his muscles are not as strong as they used to be.  Have discussed with neurosurgery at [facility] who does not think this is related to findings on recent MRI.  I also discussed with nephrology due to his renal failure.  They recommended continue giving IV fluids and renal ultrasound.   Laurence Aly, MD    Note: This note was generated in part or whole with voice recognition software. Voice recognition is usually quite accurate but there are transcription errors that can and very often do occur. I apologize for any typographical errors that were not detected and corrected.     Earleen Newport, MD 04/12/19 [phone]

## 2019-04-13 ENCOUNTER — Inpatient Hospital Stay: Payer: Medicare Other

## 2019-04-13 DIAGNOSIS — R6521 Severe sepsis with septic shock: Secondary | ICD-10-CM | POA: Diagnosis present

## 2019-04-13 DIAGNOSIS — J9601 Acute respiratory failure with hypoxia: Secondary | ICD-10-CM | POA: Diagnosis present

## 2019-04-13 DIAGNOSIS — E872 Acidosis, unspecified: Secondary | ICD-10-CM

## 2019-04-13 DIAGNOSIS — A419 Sepsis, unspecified organism: Secondary | ICD-10-CM | POA: Diagnosis present

## 2019-04-13 LAB — CBC
HCT: 24.2 % — ABNORMAL LOW (ref 39.0–52.0)
Hemoglobin: 7.7 g/dL — ABNORMAL LOW (ref 13.0–17.0)
MCH: 28 pg (ref 26.0–34.0)
MCHC: 31.8 g/dL (ref 30.0–36.0)
MCV: 88 fL (ref 80.0–100.0)
Platelets: 248 10*3/uL (ref 150–400)
RBC: 2.75 MIL/uL — ABNORMAL LOW (ref 4.22–5.81)
RDW: 15.9 % — ABNORMAL HIGH (ref 11.5–15.5)
WBC: 13.4 10*3/uL — ABNORMAL HIGH (ref 4.0–10.5)
nRBC: 0 % (ref 0.0–0.2)

## 2019-04-13 LAB — CBC WITH DIFFERENTIAL/PLATELET
Abs Immature Granulocytes: 0.13 10*3/uL — ABNORMAL HIGH (ref 0.00–0.07)
Basophils Absolute: 0 10*3/uL (ref 0.0–0.1)
Basophils Relative: 0 %
Eosinophils Absolute: 0 10*3/uL (ref 0.0–0.5)
Eosinophils Relative: 0 %
HCT: 30.6 % — ABNORMAL LOW (ref 39.0–52.0)
Hemoglobin: 9.3 g/dL — ABNORMAL LOW (ref 13.0–17.0)
Immature Granulocytes: 2 %
Lymphocytes Relative: 10 %
Lymphs Abs: 0.8 10*3/uL (ref 0.7–4.0)
MCH: 27.8 pg (ref 26.0–34.0)
MCHC: 30.4 g/dL (ref 30.0–36.0)
MCV: 91.3 fL (ref 80.0–100.0)
Monocytes Absolute: 0.7 10*3/uL (ref 0.1–1.0)
Monocytes Relative: 8 %
Neutro Abs: 6.7 10*3/uL (ref 1.7–7.7)
Neutrophils Relative %: 80 %
Platelets: 316 10*3/uL (ref 150–400)
RBC: 3.35 MIL/uL — ABNORMAL LOW (ref 4.22–5.81)
RDW: 15.9 % — ABNORMAL HIGH (ref 11.5–15.5)
WBC: 8.4 10*3/uL (ref 4.0–10.5)
nRBC: 0 % (ref 0.0–0.2)

## 2019-04-13 LAB — BLOOD GAS, ARTERIAL
Acid-base deficit: 26.4 mmol/L — ABNORMAL HIGH (ref 0.0–2.0)
Acid-base deficit: 15.9 mmol/L — ABNORMAL HIGH (ref 0.0–2.0)
Bicarbonate: 3 mmol/L — ABNORMAL LOW (ref 20.0–28.0)
Bicarbonate: 10.6 mmol/L — ABNORMAL LOW (ref 20.0–28.0)
FIO2: 0.21
FIO2: 0.4
MECHVT: 500 mL
Mechanical Rate: 15
O2 Saturation: 97.1 %
O2 Saturation: 98.8 %
PEEP: 5 cmH2O
Patient temperature: 37
Patient temperature: 37
pCO2 arterial: <19 mmHg — CL (ref 32.0–48.0)
pCO2 arterial: 27 mmHg — ABNORMAL LOW (ref 32.0–48.0)
pH, Arterial: 7 — CL (ref 7.350–7.450)
pH, Arterial: 7.2 — ABNORMAL LOW (ref 7.350–7.450)
pO2, Arterial: 133 mmHg — ABNORMAL HIGH (ref 83.0–108.0)
pO2, Arterial: 146 mmHg — ABNORMAL HIGH (ref 83.0–108.0)

## 2019-04-13 LAB — BRAIN NATRIURETIC PEPTIDE: B Natriuretic Peptide: 166 pg/mL — ABNORMAL HIGH (ref 0.0–100.0)

## 2019-04-13 LAB — RENAL FUNCTION PANEL
Albumin: 2.6 g/dL — ABNORMAL LOW (ref 3.5–5.0)
Albumin: 2.6 g/dL — ABNORMAL LOW (ref 3.5–5.0)
Anion gap: 27 — ABNORMAL HIGH (ref 5–15)
BUN: 59 mg/dL — ABNORMAL HIGH (ref 8–23)
BUN: 68 mg/dL — ABNORMAL HIGH (ref 8–23)
CO2: 8 mmol/L — ABNORMAL LOW (ref 22–32)
CO2: <7 mmol/L — ABNORMAL LOW (ref 22–32)
Calcium: 7.5 mg/dL — ABNORMAL LOW (ref 8.9–10.3)
Calcium: 7.3 mg/dL — ABNORMAL LOW (ref 8.9–10.3)
Chloride: 99 mmol/L (ref 98–111)
Chloride: 98 mmol/L (ref 98–111)
Creatinine, Ser: 5.64 mg/dL — ABNORMAL HIGH (ref 0.61–1.24)
Creatinine, Ser: 6.83 mg/dL — ABNORMAL HIGH (ref 0.61–1.24)
GFR calc Af Amer: 10 mL/min — ABNORMAL LOW (ref >60)
GFR calc Af Amer: 8 mL/min — ABNORMAL LOW (ref >60)
GFR calc non Af Amer: 8 mL/min — ABNORMAL LOW (ref >60)
GFR calc non Af Amer: 7 mL/min — ABNORMAL LOW (ref >60)
Glucose, Bld: 257 mg/dL — ABNORMAL HIGH (ref 70–99)
Glucose, Bld: 307 mg/dL — ABNORMAL HIGH (ref 70–99)
Phosphorus: 6.9 mg/dL — ABNORMAL HIGH (ref 2.5–4.6)
Phosphorus: 8.8 mg/dL — ABNORMAL HIGH (ref 2.5–4.6)
Potassium: 5.5 mmol/L — ABNORMAL HIGH (ref 3.5–5.1)
Potassium: 6.6 mmol/L (ref 3.5–5.1)
Sodium: 134 mmol/L — ABNORMAL LOW (ref 135–145)
Sodium: 134 mmol/L — ABNORMAL LOW (ref 135–145)

## 2019-04-13 LAB — COMPREHENSIVE METABOLIC PANEL
ALT: 56 U/L — ABNORMAL HIGH (ref 0–44)
AST: 89 U/L — ABNORMAL HIGH (ref 15–41)
Albumin: 2.6 g/dL — ABNORMAL LOW (ref 3.5–5.0)
Alkaline Phosphatase: 60 U/L (ref 38–126)
Anion gap: UNDETERMINED (ref 5–15)
BUN: 71 mg/dL — ABNORMAL HIGH (ref 8–23)
CO2: <7 mmol/L — ABNORMAL LOW (ref 22–32)
Calcium: 8 mg/dL — ABNORMAL LOW (ref 8.9–10.3)
Chloride: 101 mmol/L (ref 98–111)
Creatinine, Ser: 7.64 mg/dL — ABNORMAL HIGH (ref 0.61–1.24)
GFR calc Af Amer: 7 mL/min — ABNORMAL LOW (ref >60)
GFR calc non Af Amer: 6 mL/min — ABNORMAL LOW (ref >60)
Glucose, Bld: 149 mg/dL — ABNORMAL HIGH (ref 70–99)
Potassium: >7.5 mmol/L (ref 3.5–5.1)
Sodium: 135 mmol/L (ref 135–145)
Total Bilirubin: 0.7 mg/dL (ref 0.3–1.2)
Total Protein: 6.1 g/dL — ABNORMAL LOW (ref 6.5–8.1)

## 2019-04-13 LAB — AMMONIA: Ammonia: 269 umol/L — ABNORMAL HIGH (ref 9–35)

## 2019-04-13 LAB — GLUCOSE, CAPILLARY
Glucose-Capillary: 145 mg/dL — ABNORMAL HIGH (ref 70–99)
Glucose-Capillary: 156 mg/dL — ABNORMAL HIGH (ref 70–99)
Glucose-Capillary: 158 mg/dL — ABNORMAL HIGH (ref 70–99)
Glucose-Capillary: 264 mg/dL — ABNORMAL HIGH (ref 70–99)
Glucose-Capillary: 284 mg/dL — ABNORMAL HIGH (ref 70–99)
Glucose-Capillary: 304 mg/dL — ABNORMAL HIGH (ref 70–99)

## 2019-04-13 LAB — LACTIC ACID, PLASMA
Lactic Acid, Venous: 7.1 mmol/L (ref 0.5–1.9)
Lactic Acid, Venous: 9.3 mmol/L (ref 0.5–1.9)
Lactic Acid, Venous: >11 mmol/L (ref 0.5–1.9)
Lactic Acid, Venous: >11 mmol/L (ref 0.5–1.9)

## 2019-04-13 LAB — HEPATIC FUNCTION PANEL
ALT: 68 U/L — ABNORMAL HIGH (ref 0–44)
AST: 87 U/L — ABNORMAL HIGH (ref 15–41)
Albumin: 2.7 g/dL — ABNORMAL LOW (ref 3.5–5.0)
Alkaline Phosphatase: 59 U/L (ref 38–126)
Bilirubin, Direct: <0.1 mg/dL (ref 0.0–0.2)
Total Bilirubin: 1 mg/dL (ref 0.3–1.2)
Total Protein: 6 g/dL — ABNORMAL LOW (ref 6.5–8.1)

## 2019-04-13 LAB — TROPONIN I (HIGH SENSITIVITY)
Troponin I (High Sensitivity): 24 ng/L — ABNORMAL HIGH (ref <18)
Troponin I (High Sensitivity): 27 ng/L — ABNORMAL HIGH (ref <18)
Troponin I (High Sensitivity): 30 ng/L — ABNORMAL HIGH (ref <18)

## 2019-04-13 LAB — PROCALCITONIN
Procalcitonin: 1.48 ng/mL
Procalcitonin: 1.19 ng/mL

## 2019-04-13 LAB — HEMOGLOBIN: Hemoglobin: 7.9 g/dL — ABNORMAL LOW (ref 13.0–17.0)

## 2019-04-13 LAB — HEMOGLOBIN A1C
Hgb A1c MFr Bld: 8.1 % — ABNORMAL HIGH (ref 4.8–5.6)
Mean Plasma Glucose: 185.77 mg/dL

## 2019-04-13 LAB — OSMOLALITY, URINE: Osmolality, Ur: 312 mOsm/kg (ref 300–900)

## 2019-04-13 LAB — MRSA PCR SCREENING: MRSA by PCR: NEGATIVE

## 2019-04-13 LAB — NA AND K (SODIUM & POTASSIUM), RAND UR
Potassium Urine: 10 mmol/L
Sodium, Ur: 124 mmol/L

## 2019-04-13 LAB — POTASSIUM
Potassium: 6.3 mmol/L (ref 3.5–5.1)
Potassium: 5.6 mmol/L — ABNORMAL HIGH (ref 3.5–5.1)

## 2019-04-13 LAB — CREATININE, URINE, RANDOM: Creatinine, Urine: 28 mg/dL

## 2019-04-13 LAB — FERRITIN: Ferritin: 103 ng/mL (ref 24–336)

## 2019-04-13 LAB — C-REACTIVE PROTEIN: CRP: 13.8 mg/dL — ABNORMAL HIGH (ref <1.0)

## 2019-04-13 MED ORDER — AMIODARONE IV BOLUS ONLY 150 MG/100ML
150.0000 mg | Freq: Once | INTRAVENOUS | Status: AC
  Administered 2019-04-13: 150 mg via INTRAVENOUS

## 2019-04-13 MED ORDER — SODIUM BICARBONATE 8.4 % IV SOLN
INTRAVENOUS | Status: AC
  Administered 2019-04-13: 50 meq via INTRAVENOUS
  Filled 2019-04-13: qty 50

## 2019-04-13 MED ORDER — SODIUM BICARBONATE-DEXTROSE 150-5 MEQ/L-% IV SOLN
150.0000 meq | INTRAVENOUS | Status: DC
  Administered 2019-04-13 (×2): 150 meq via INTRAVENOUS
  Filled 2019-04-13 (×3): qty 1000

## 2019-04-13 MED ORDER — VASOPRESSIN 20 UNIT/ML IV SOLN
0.0300 [IU]/min | INTRAVENOUS | Status: DC
  Administered 2019-04-13 – 2019-04-14 (×2): 0.03 [IU]/min via INTRAVENOUS
  Filled 2019-04-13 (×3): qty 2

## 2019-04-13 MED ORDER — HEPARIN SODIUM (PORCINE) 1000 UNIT/ML DIALYSIS
1000.0000 [IU] | INTRAMUSCULAR | Status: DC | PRN
  Administered 2019-04-13: 2800 [IU] via INTRAVENOUS_CENTRAL
  Administered 2019-04-16: 02:00:00 1000 [IU] via INTRAVENOUS_CENTRAL
  Filled 2019-04-13 (×3): qty 6

## 2019-04-13 MED ORDER — MORPHINE SULFATE (PF) 2 MG/ML IV SOLN
1.0000 mg | Freq: Once | INTRAVENOUS | Status: AC
  Administered 2019-04-13: 16:00:00 1 mg via INTRAVENOUS

## 2019-04-13 MED ORDER — SODIUM CHLORIDE 0.9 % IV SOLN
0.0000 ug/min | INTRAVENOUS | Status: DC
  Filled 2019-04-13: qty 4

## 2019-04-13 MED ORDER — MIDAZOLAM HCL 2 MG/2ML IJ SOLN
2.0000 mg | Freq: Once | INTRAMUSCULAR | Status: AC
  Administered 2019-04-13: 2 mg via INTRAVENOUS

## 2019-04-13 MED ORDER — CHLORHEXIDINE GLUCONATE 0.12% ORAL RINSE (MEDLINE KIT)
15.0000 mL | Freq: Two times a day (BID) | OROMUCOSAL | Status: DC
  Administered 2019-04-13 – 2019-04-19 (×12): 15 mL via OROMUCOSAL

## 2019-04-13 MED ORDER — PRISMASOL BGK 0/2.5 32-2.5 MEQ/L REPLACEMENT SOLN
Status: DC
  Administered 2019-04-13 – 2019-04-14 (×4): via INTRAVENOUS_CENTRAL
  Filled 2019-04-13: qty 5000

## 2019-04-13 MED ORDER — DEXTROSE 50 % IV SOLN
1.0000 | Freq: Once | INTRAVENOUS | Status: AC
  Administered 2019-04-13: 06:00:00 50 mL via INTRAVENOUS

## 2019-04-13 MED ORDER — STERILE WATER FOR INJECTION IV SOLN
INTRAVENOUS | Status: DC
  Administered 2019-04-13 – 2019-04-15 (×3): via INTRAVENOUS
  Filled 2019-04-13 (×12): qty 850

## 2019-04-13 MED ORDER — VANCOMYCIN HCL 10 G IV SOLR
2500.0000 mg | Freq: Once | INTRAVENOUS | Status: AC
  Administered 2019-04-13: 2500 mg via INTRAVENOUS
  Filled 2019-04-13: qty 2500

## 2019-04-13 MED ORDER — HEPARIN SODIUM (PORCINE) 5000 UNIT/ML IJ SOLN
5000.0000 [IU] | Freq: Three times a day (TID) | INTRAMUSCULAR | Status: DC
  Administered 2019-04-13 – 2019-04-15 (×7): 5000 [IU] via SUBCUTANEOUS
  Filled 2019-04-13 (×7): qty 1

## 2019-04-13 MED ORDER — CHLORHEXIDINE GLUCONATE 0.12 % MT SOLN
15.0000 mL | Freq: Two times a day (BID) | OROMUCOSAL | Status: DC
  Administered 2019-04-13: 15 mL via OROMUCOSAL

## 2019-04-13 MED ORDER — SODIUM CHLORIDE 0.9 % IV BOLUS
1000.0000 mL | Freq: Once | INTRAVENOUS | Status: AC
  Administered 2019-04-13: 1000 mL via INTRAVENOUS

## 2019-04-13 MED ORDER — ALBUMIN HUMAN 25 % IV SOLN
12.5000 g | Freq: Once | INTRAVENOUS | Status: AC
  Administered 2019-04-13: 12.5 g via INTRAVENOUS
  Filled 2019-04-13 (×2): qty 50

## 2019-04-13 MED ORDER — NOREPINEPHRINE 4 MG/250ML-% IV SOLN
INTRAVENOUS | Status: AC
  Administered 2019-04-13: 2 ug/min via INTRAVENOUS
  Filled 2019-04-13: qty 250

## 2019-04-13 MED ORDER — MORPHINE SULFATE (PF) 2 MG/ML IV SOLN
INTRAVENOUS | Status: AC
  Administered 2019-04-13: 1 mg via INTRAVENOUS
  Filled 2019-04-13: qty 1

## 2019-04-13 MED ORDER — SODIUM BICARBONATE 8.4 % IV SOLN
100.0000 meq | Freq: Once | INTRAVENOUS | Status: AC
  Administered 2019-04-13: 100 meq via INTRAVENOUS

## 2019-04-13 MED ORDER — INSULIN ASPART 100 UNIT/ML IV SOLN
5.0000 [IU] | Freq: Once | INTRAVENOUS | Status: AC
  Administered 2019-04-13: 5 [IU] via INTRAVENOUS
  Filled 2019-04-13: qty 0.05

## 2019-04-13 MED ORDER — NOREPINEPHRINE 16 MG/250ML-% IV SOLN
0.0000 ug/min | INTRAVENOUS | Status: DC
  Administered 2019-04-13: 40 ug/min via INTRAVENOUS
  Administered 2019-04-13: 25 ug/min via INTRAVENOUS
  Administered 2019-04-15: 2 ug/min via INTRAVENOUS
  Filled 2019-04-13 (×3): qty 250

## 2019-04-13 MED ORDER — HYDROCORTISONE NA SUCCINATE PF 100 MG IJ SOLR
50.0000 mg | Freq: Four times a day (QID) | INTRAMUSCULAR | Status: AC
  Administered 2019-04-13 – 2019-04-14 (×4): 50 mg via INTRAVENOUS
  Filled 2019-04-13 (×4): qty 2

## 2019-04-13 MED ORDER — NOREPINEPHRINE 4 MG/250ML-% IV SOLN
0.0000 ug/min | INTRAVENOUS | Status: DC
  Administered 2019-04-13: 25 ug/min via INTRAVENOUS
  Administered 2019-04-13: 05:00:00 2 ug/min via INTRAVENOUS
  Filled 2019-04-13: qty 250

## 2019-04-13 MED ORDER — PRISMASOL BGK 0/2.5 32-2.5 MEQ/L IV SOLN
INTRAVENOUS | Status: DC
  Administered 2019-04-13 – 2019-04-14 (×5): via INTRAVENOUS_CENTRAL
  Filled 2019-04-13: qty 5000

## 2019-04-13 MED ORDER — AMIODARONE IV BOLUS ONLY 150 MG/100ML
INTRAVENOUS | Status: AC
  Filled 2019-04-13: qty 100

## 2019-04-13 MED ORDER — HYDROCORTISONE NA SUCCINATE PF 100 MG IJ SOLR
100.0000 mg | Freq: Once | INTRAMUSCULAR | Status: AC
  Administered 2019-04-13: 100 mg via INTRAVENOUS
  Filled 2019-04-13: qty 2

## 2019-04-13 MED ORDER — ORAL CARE MOUTH RINSE
15.0000 mL | OROMUCOSAL | Status: DC
  Administered 2019-04-13 – 2019-04-18 (×45): 15 mL via OROMUCOSAL

## 2019-04-13 MED ORDER — ALTEPLASE 2 MG IJ SOLR
2.0000 mg | Freq: Once | INTRAMUSCULAR | Status: AC
  Administered 2019-04-13: 2 mg
  Filled 2019-04-13: qty 2

## 2019-04-13 MED ORDER — DILTIAZEM HCL 25 MG/5ML IV SOLN
INTRAVENOUS | Status: AC
  Filled 2019-04-13: qty 5

## 2019-04-13 MED ORDER — STERILE WATER FOR INJECTION IJ SOLN
INTRAMUSCULAR | Status: AC
  Administered 2019-04-13: 10 mL
  Filled 2019-04-13: qty 10

## 2019-04-13 MED ORDER — ROCURONIUM BROMIDE 50 MG/5ML IV SOLN
60.0000 mg | Freq: Once | INTRAVENOUS | Status: AC
  Administered 2019-04-13: 60 mg via INTRAVENOUS
  Filled 2019-04-13: qty 2

## 2019-04-13 MED ORDER — SODIUM BICARBONATE 8.4 % IV SOLN
50.0000 meq | Freq: Once | INTRAVENOUS | Status: AC
  Administered 2019-04-13: 06:00:00 50 meq via INTRAVENOUS

## 2019-04-13 MED ORDER — FENTANYL 2500MCG IN NS 250ML (10MCG/ML) PREMIX INFUSION
0.0000 ug/h | INTRAVENOUS | Status: DC
  Administered 2019-04-13: 100 ug/h via INTRAVENOUS
  Administered 2019-04-14 (×2): 300 ug/h via INTRAVENOUS
  Administered 2019-04-14 – 2019-04-15 (×2): 250 ug/h via INTRAVENOUS
  Administered 2019-04-15 – 2019-04-16 (×2): 300 ug/h via INTRAVENOUS
  Administered 2019-04-16: 09:00:00 200 ug/h via INTRAVENOUS
  Administered 2019-04-17: 21:00:00 150 ug/h via INTRAVENOUS
  Administered 2019-04-17 (×2): 350 ug/h via INTRAVENOUS
  Administered 2019-04-18: 06:00:00 250 ug/h via INTRAVENOUS
  Filled 2019-04-13 (×13): qty 250

## 2019-04-13 MED ORDER — CHLORHEXIDINE GLUCONATE CLOTH 2 % EX PADS: 6.0000 | MEDICATED_PAD | Freq: Every day | CUTANEOUS | Status: DC

## 2019-04-13 MED ORDER — FENTANYL CITRATE (PF) 100 MCG/2ML IJ SOLN
100.0000 ug | Freq: Once | INTRAMUSCULAR | Status: AC
  Administered 2019-04-13: 100 ug via INTRAVENOUS
  Filled 2019-04-13: qty 2

## 2019-04-13 MED ORDER — METRONIDAZOLE IN NACL 5-0.79 MG/ML-% IV SOLN
500.0000 mg | Freq: Three times a day (TID) | INTRAVENOUS | Status: DC
  Administered 2019-04-13 – 2019-04-15 (×8): 500 mg via INTRAVENOUS
  Filled 2019-04-13 (×10): qty 100

## 2019-04-13 MED ORDER — VANCOMYCIN VARIABLE DOSE PER UNSTABLE RENAL FUNCTION (PHARMACIST DOSING): Status: DC

## 2019-04-13 MED ORDER — INSULIN ASPART 100 UNIT/ML ~~LOC~~ SOLN
0.0000 [IU] | SUBCUTANEOUS | Status: DC
  Administered 2019-04-13: 8 [IU] via SUBCUTANEOUS
  Administered 2019-04-13 (×2): 11 [IU] via SUBCUTANEOUS
  Administered 2019-04-14: 8 [IU] via SUBCUTANEOUS
  Administered 2019-04-14 (×3): 3 [IU] via SUBCUTANEOUS
  Administered 2019-04-14 – 2019-04-15 (×2): 2 [IU] via SUBCUTANEOUS
  Administered 2019-04-15: 3 [IU] via SUBCUTANEOUS
  Administered 2019-04-15: 5 [IU] via SUBCUTANEOUS
  Administered 2019-04-15: 08:00:00 3 [IU] via SUBCUTANEOUS
  Administered 2019-04-15: 5 [IU] via SUBCUTANEOUS
  Administered 2019-04-15 – 2019-04-16 (×4): 3 [IU] via SUBCUTANEOUS
  Administered 2019-04-16 (×2): 5 [IU] via SUBCUTANEOUS
  Administered 2019-04-16 – 2019-04-17 (×2): 3 [IU] via SUBCUTANEOUS
  Administered 2019-04-17: 20:00:00 5 [IU] via SUBCUTANEOUS
  Administered 2019-04-17 (×3): 2 [IU] via SUBCUTANEOUS
  Administered 2019-04-18 (×3): 3 [IU] via SUBCUTANEOUS
  Filled 2019-04-13 (×30): qty 1

## 2019-04-13 MED ORDER — DILTIAZEM HCL 25 MG/5ML IV SOLN
10.0000 mg | Freq: Once | INTRAVENOUS | Status: AC
  Administered 2019-04-13: 10 mg via INTRAVENOUS

## 2019-04-13 MED ORDER — IPRATROPIUM-ALBUTEROL 0.5-2.5 (3) MG/3ML IN SOLN: 3.0000 mL | Freq: Four times a day (QID) | RESPIRATORY_TRACT | Status: DC | PRN

## 2019-04-13 MED ORDER — MIDAZOLAM HCL 2 MG/2ML IJ SOLN
INTRAMUSCULAR | Status: AC
  Administered 2019-04-13: 2 mg via INTRAVENOUS
  Filled 2019-04-13: qty 2

## 2019-04-13 MED ORDER — CALCIUM GLUCONATE-NACL 1-0.675 GM/50ML-% IV SOLN
1.0000 g | Freq: Once | INTRAVENOUS | Status: AC
  Administered 2019-04-13: 1000 mg via INTRAVENOUS
  Filled 2019-04-13: qty 50

## 2019-04-13 MED ORDER — ORAL CARE MOUTH RINSE
15.0000 mL | Freq: Two times a day (BID) | OROMUCOSAL | Status: DC
  Administered 2019-04-13 (×2): 15 mL via OROMUCOSAL

## 2019-04-13 MED ORDER — FAMOTIDINE IN NACL 20-0.9 MG/50ML-% IV SOLN
20.0000 mg | INTRAVENOUS | Status: DC
  Administered 2019-04-14: 20 mg via INTRAVENOUS
  Filled 2019-04-13: qty 50

## 2019-04-13 MED ORDER — DEXTROSE 50 % IV SOLN
INTRAVENOUS | Status: AC
  Administered 2019-04-13: 50 mL via INTRAVENOUS
  Filled 2019-04-13: qty 50

## 2019-04-13 MED ORDER — SODIUM BICARBONATE 8.4 % IV SOLN
150.0000 meq | Freq: Once | INTRAVENOUS | Status: AC
  Administered 2019-04-13: 150 meq via INTRAVENOUS
  Filled 2019-04-13: qty 150

## 2019-04-13 MED ORDER — FAMOTIDINE IN NACL 20-0.9 MG/50ML-% IV SOLN
20.0000 mg | INTRAVENOUS | Status: DC
  Administered 2019-04-13: 20 mg via INTRAVENOUS
  Filled 2019-04-13: qty 50

## 2019-04-13 MED ORDER — PRISMASOL BGK 0/2.5 32-2.5 MEQ/L REPLACEMENT SOLN
Status: DC
  Administered 2019-04-13 (×2): via INTRAVENOUS_CENTRAL
  Filled 2019-04-13: qty 5000

## 2019-04-13 NOTE — Procedures (Signed)
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  Consent for procedure obtained.  Consent obtained from patient's niece Mrs. Judith Part Time Out: Verified patient identification, verified procedure, site/side was marked, verified correct patient position, special equipment/implants available, medications/allergies/relevent history reviewed, required imaging and test results available.  Timeout: Performed  Maximum sterile technique was used including antiseptics, cap, gloves, gown, hand hygiene, mask and sheet. Skin prep: Chlorhexidine; local anesthetic administered A antimicrobial bonded/coated triple lumen hemodialysis catheter was placed in the right femoral vein due to emergent situation using the Seldinger technique.  Evaluation Blood flow good Complications: No apparent complications Patient did tolerate procedure well. Chest X-ray ordered to verify placement.  CXR: Not indicated for femoral location.  Procedure performed under direct supervision of Dr.Aleskerov. Ultrasound utilized for realtime vessel cannulation  Magddalene S Tukov-Yual 04/13/2019, 7:01 AM

## 2019-04-13 NOTE — Consult Note (Signed)
Pharmacy Antibiotic Note  Philip Garrett is a 85 y.o. male admitted on 04/12/2019 AKI secondary to suspected acute tubular necrosis and metabolic acidosis.  Pharmacy has been consulted for Vancomycin dosing for sepsis.   Temporary dialysis catheter has been placed. Nephrology plans to initiate CRRT today.   Plan: Will bolus with vancomycin 2500mg  IV x 1 dose. Once CRRT is initiated, will start Vancomycin 1000mg  (~10/mg/kg) IV every 24 hours. Will consider drawing vancomycin level prior to 4th or 5th dose.   Pharmacy will monitor renal function and continue to adjust dose as needed.   Height: 6\' 4"  (193 cm) Weight: 280 lb (127 kg) IBW/kg (Calculated) : 86.8  Temp (24hrs), Avg:92.7 F (33.7 C), Min:91.8 F (33.2 C), Max:98.7 F (37.1 C)  Recent Labs  Lab 04/12/19 1154 04/12/19 1158 04/12/19 1530 04/12/19 2339 04/13/19 0355 04/13/19 0552  WBC 9.0  --   --   --  8.4  --   CREATININE 6.96*  --   --   --  7.64*  --   LATICACIDVEN  --  9.6* 10.7* >11.0*  --  >11.0*    Estimated Creatinine Clearance: 10.3 mL/min (A) (by C-G formula based on SCr of 7.64 mg/dL (H)).    No Known Allergies  Antimicrobials this admission: 11/6 ceftriaxone  >>  11/7 vancomhcin>>   Microbiology results: 11/7 BCx: pending 11/7  UCx: pending 11/7 MRSA PCR: negative   Thank you for allowing pharmacy to be a part of this patient's care.  Cana Mignano M Krishiv Sandler 04/13/2019 10:54 AM

## 2019-04-13 NOTE — ED Notes (Signed)
RN attempted to give pt orange juice pt not able to suction from straw, RN asked pt if able to swallow by cup pt attempted but not able to drink.

## 2019-04-13 NOTE — Progress Notes (Addendum)
Received pt from Ed via cart, pt is unresponsive expect to deep stimulation and pt with shallow breathing, eye open but not responding to name calling. RT was notified and Pt placed on Bipap as ordered.  Pt on levo gtt BP low, NP at bed side now. Levo gtt increased to 7.5. pt started on Bicarb gtt. E-link Dr on to assess pt. Pt niece was called and she stated pt will be DNR/DNI but will like him to treated expect no CPR. Pt Niece Andrewjames Rodela consented for HD cath placement.

## 2019-04-13 NOTE — Consult Note (Signed)
Consult note       [facility] CONSULT NOTE    Date: 04/13/2019                  Patient Name:  Philip Garrett  MRN: [id number]  DOB: [birth year]  Age / Sex: 85 y.o., male         PCP: Jodi Marble, MD                 Service Requesting Consult:  Critical care                 Reason for Consult:  Acute renal failure            History of Present Illness: Patient is a 85 y.o. male with a PMHx of congestive heart failure, coronary artery disease, diabetes mellitus type 2, hyperlipidemia, hypertension, myocardial infarction, stroke, who was admitted to [facility] on 04/12/2019 for nausea, diarrhea, low grade fever.  Patient was residing at a rehabilitation facility prior to coming here.  Patient unfortunately unable to offer any history at the moment.  Patient with severe metabolic derangements at the moment.  BUN currently 71 with a creatinine of 7.64.  Potassium also greater than 7.5.  Case was discussed with nurse practitioner.  Urgent temporary dialysis catheter has been placed.  We have ordered continuous renal replacement therapy.  Patient was made DNR earlier this a.m.  He also has significant metabolic acidosis with a pH of 7 PCO2 less than 19.  He is being maintained on pressors at the moment.   Medications: Outpatient medications: Medications Prior to Admission  Medication Sig Dispense Refill Last Dose  . acetaminophen (TYLENOL) 500 MG tablet Take 1,000 mg by mouth every 4 (four) hours as needed for mild pain or moderate pain.    Unknown at PRN  . amLODipine (NORVASC) 5 MG tablet Take 5 mg by mouth daily.    04/12/2019 at 0800  . atorvastatin (LIPITOR) 20 MG tablet Take 20 mg by mouth at bedtime.    04/11/2019 at 2000  . carvedilol (COREG) 25 MG tablet Take 25 mg by mouth 2 (two) times daily with a meal.    04/12/2019 at 0800  . clopidogrel (PLAVIX) 75 MG tablet Take 75 mg by mouth daily.   04/12/2019 at 0800  . fluticasone (FLONASE) 50 MCG/ACT nasal spray Place 1  spray into both nostrils daily.   04/12/2019 at 0800  . insulin aspart (NOVOLOG) 100 UNIT/ML injection Inject 0-10 Units into the skin See admin instructions. Inject under the skin before meals according to sliding scale:  <150: 0u 150-200: 2u 201-250: 4u 251-300: 6u 301-350: 8u 351-400: 10u   As directed at As directed  . Insulin Detemir (LEVEMIR FLEXTOUCH) 100 UNIT/ML Pen Inject 10 Units into the skin daily.    04/11/2019 at Unknown time  . latanoprost (XALATAN) 0.005 % ophthalmic solution Place 1 drop into both eyes at bedtime.   04/11/2019 at 2000  . linagliptin (TRADJENTA) 5 MG TABS tablet Take 5 mg by mouth daily.   04/12/2019 at 0800  . loratadine (CLARITIN) 10 MG tablet Take 10 mg by mouth daily as needed for allergies.    Unknown at PRN  . metFORMIN (GLUCOPHAGE) 1000 MG tablet Take 1,000 mg by mouth 2 (two) times daily.   04/12/2019 at 0800  . montelukast (SINGULAIR) 10 MG tablet Take 10 mg by mouth daily.    04/12/2019 at 0800  . ramipril (ALTACE) 10 MG capsule Take  10 mg by mouth daily.   04/12/2019 at 0800    Current medications: Current Facility-Administered Medications  Medication Dose Route Frequency Provider Last Rate Last Dose  . 0.9 %  sodium chloride infusion   Intravenous Continuous Danford, Suann Larry, MD   Stopped at 04/13/19 0448  . acetaminophen (TYLENOL) tablet 650 mg  650 mg Oral Q6H PRN Danford, Suann Larry, MD       Or  . acetaminophen (TYLENOL) suppository 650 mg  650 mg Rectal Q6H PRN Danford, Suann Larry, MD      . cefTRIAXone (ROCEPHIN) 1 g in sodium chloride 0.9 % 100 mL IVPB  1 g Intravenous Q24H Danford, Suann Larry, MD      . Chlorhexidine Gluconate Cloth 2 % PADS 6 each  6 each Topical Daily Ouma, Bing Neighbors, NP      . clopidogrel (PLAVIX) tablet 75 mg  75 mg Oral Daily Danford, Christopher P, MD      . heparin injection 1,000-6,000 Units  1,000-6,000 Units CRRT PRN Jahmari Esbenshade, MD      . heparin injection 5,000 Units  5,000 Units  Subcutaneous Q8H Danford, Suann Larry, MD   5,000 Units at 04/13/19 0203  . insulin aspart (novoLOG) injection 0-5 Units  0-5 Units Subcutaneous QHS Danford, Christopher P, MD      . insulin aspart (novoLOG) injection 0-9 Units  0-9 Units Subcutaneous TID WC Danford, Christopher P, MD      . norepinephrine (LEVOPHED) 4mg  in 239mL premix infusion  0-40 mcg/min Intravenous Titrated Lang Snow, NP 28.1 mL/hr at 04/13/19 0646 7.5 mcg/min at 04/13/19 0646  . ondansetron (ZOFRAN) tablet 4 mg  4 mg Oral Q6H PRN Danford, Suann Larry, MD       Or  . ondansetron (ZOFRAN) injection 4 mg  4 mg Intravenous Q6H PRN Danford, Suann Larry, MD      . oxyCODONE (Oxy IR/ROXICODONE) immediate release tablet 5 mg  5 mg Oral Q4H PRN Danford, Suann Larry, MD      . prismasol BGK 0/2.5 infusion   CRRT Continuous Kumiko Fishman, MD      . prismasol BGK 0/2.5 infusion   CRRT Continuous Solara Goodchild, MD      . prismasol BGK 0/2.5 infusion   CRRT Continuous Czarina Gingras, MD      . sodium bicarbonate 150 mEq in dextrose 5% 1000 mL infusion  150 mEq Intravenous Continuous Lang Snow, NP 100 mL/hr at 04/13/19 0738    . vancomycin (VANCOCIN) 2,500 mg in sodium chloride 0.9 % 500 mL IVPB  2,500 mg Intravenous Once Hallaji, Sheema M, RPH          Allergies: No Known Allergies    Past Medical History: Past Medical History:  Diagnosis Date  . Cervical stenosis of spinal canal   . CHF (congestive heart failure) ([place])   . Coronary artery disease   . Diabetes mellitus without complication ([place])   . Hyperlipidemia   . Hypertension   . Myocardial infarction ([place])   . Stroke [facility])    TIA     Past Surgical History: Past Surgical History:  Procedure Laterality Date  . AMPUTATION TOE Left 10/30/2015   Procedure: AMPUTATION LEFT SECOND TOE/mpj left 2nd;  Surgeon: Sharlotte Alamo, DPM;  Location: [facility] ORS;  Service: Podiatry;  Laterality: Left;  . CORONARY ANGIOPLASTY    . TOE AMPUTATION  Left    great toe     Family History: Family History  Problem Relation Age of Onset  .  Diabetes Other      Social History: Social History   Socioeconomic History  . Marital status: Single    Spouse name: Not on file  . Number of children: Not on file  . Years of education: Not on file  . Highest education level: Not on file  Occupational History  . Not on file  Social Needs  . Financial resource strain: Not on file  . Food insecurity    Worry: Not on file    Inability: Not on file  . Transportation needs    Medical: Not on file    Non-medical: Not on file  Tobacco Use  . Smoking status: Never Smoker  . Smokeless tobacco: Current User    Types: Snuff, Chew  Substance and Sexual Activity  . Alcohol use: No  . Drug use: No  . Sexual activity: Not on file  Lifestyle  . Physical activity    Days per week: Not on file    Minutes per session: Not on file  . Stress: Not on file  Relationships  . Social Herbalist on phone: Not on file    Gets together: Not on file    Attends religious service: Not on file    Active member of club or organization: Not on file    Attends meetings of clubs or organizations: Not on file    Relationship status: Not on file  . Intimate partner violence    Fear of current or ex partner: Not on file    Emotionally abused: Not on file    Physically abused: Not on file    Forced sexual activity: Not on file  Other Topics Concern  . Not on file  Social History Narrative  . Not on file     Review of Systems: Patient unable to offer review of systems due to shortness of breath and being on BiPAP.   Vital Signs: Blood pressure (!) 88/45, pulse 67, temperature (!) 92.1 F (33.4 C), resp. rate 18, height 6\' 4"  (1.93 m), weight 127 kg, SpO2 100 %.  Weight trends: Filed Weights   04/12/19 1148  Weight: 127 kg    Physical Exam: General:  Critically ill-appearing  Head: Normocephalic, atraumatic.  Eyes: Anicteric, EOMI   Nose: Mucous membranes moist, not inflammed, nonerythematous.  Throat: Oropharynx nonerythematous, no exudate appreciated.   Neck: Supple, trachea midline.  Lungs:  Scattered rhonchi bilateral, on BiPAP  Heart: S1S2 no obvious urb  Abdomen:  BS normoactive. Soft, Nondistended, non-tender.  No masses or organomegaly.  Extremities: trace pretibial edema.  Neurologic: Awake, not following commands  Skin: No visible rashes, scars.    Lab results: Basic Metabolic Panel: Recent Labs  Lab 04/12/19 1154 04/13/19 0355  NA 131* 135  K 5.8* >7.5*  CL 95* 101  CO2 11* <7*  GLUCOSE 140* 149*  BUN 60* 71*  CREATININE 6.96* 7.64*  CALCIUM 8.9 8.0*    Liver Function Tests: Recent Labs  Lab 04/12/19 1154 04/13/19 0355  AST 87* 89*  ALT 48* 56*  ALKPHOS 68 60  BILITOT 0.7 0.7  PROT 7.1 6.1*  ALBUMIN 3.1* 2.6*   No results for input(s): LIPASE, AMYLASE in the last 168 hours. No results for input(s): AMMONIA in the last 168 hours.  CBC: Recent Labs  Lab 04/12/19 1154 04/13/19 0355  WBC 9.0 8.4  NEUTROABS 7.0 6.7  HGB 9.9* 9.3*  HCT 31.0* 30.6*  MCV 86.8 91.3  PLT 259 316    Cardiac  Enzymes: No results for input(s): CKTOTAL, CKMB, CKMBINDEX, TROPONINI in the last 168 hours.  BNP: Invalid input(s): POCBNP  CBG: Recent Labs  Lab 04/12/19 2334 04/13/19 0008 04/13/19 0423 04/13/19 0537 04/13/19 0749  GLUCAP 169* 156* 145* 158* 284*    Microbiology: Results for orders placed or performed during the hospital encounter of 04/12/19  SARS CORONAVIRUS 2 (TAT 6-24 HRS) Nasopharyngeal Nasopharyngeal Swab     Status: None   Collection Time: 04/12/19 11:58 AM   Specimen: Nasopharyngeal Swab  Result Value Ref Range Status   SARS Coronavirus 2 NEGATIVE NEGATIVE Final    Comment: (NOTE) SARS-CoV-2 target nucleic acids are NOT DETECTED. The SARS-CoV-2 RNA is generally detectable in upper and lower respiratory specimens during the acute phase of infection.  Negative results do not preclude SARS-CoV-2 infection, do not rule out co-infections with other pathogens, and should not be used as the sole basis for treatment or other patient management decisions. Negative results must be combined with clinical observations, patient history, and epidemiological information. The expected result is Negative. Fact Sheet for Patients: SugarRoll.be Fact Sheet for Healthcare Providers: https://www.woods-mathews.com/ This test is not yet approved or cleared by the [place] FDA and  has been authorized for detection and/or diagnosis of SARS-CoV-2 by FDA under an Emergency Use Authorization (EUA). This EUA will remain  in effect (meaning this test can be used) for the duration of the COVID-19 declaration under Section 56 4(b)(1) of the Act, 21 U.S.C. section 360bbb-3(b)(1), unless the authorization is terminated or revoked sooner. Performed at [facility], [place] [address] [place], [place] [address]   MRSA PCR Screening     Status: None   Collection Time: 04/13/19  5:59 AM   Specimen: Nasal Mucosa; Nasopharyngeal  Result Value Ref Range Status   MRSA by PCR NEGATIVE NEGATIVE Final    Comment:        The GeneXpert MRSA Assay (FDA approved for NASAL specimens only), is one component of a comprehensive MRSA colonization surveillance program. It is not intended to diagnose MRSA infection nor to guide or monitor treatment for MRSA infections. Performed at [facility], [address] [place], [place] [address]     Coagulation Studies: No results for input(s): LABPROT, INR in the last 72 hours.  Urinalysis: Recent Labs    04/12/19 1418  COLORURINE YELLOW*  LABSPEC 1.011  PHURINE 6.0  GLUCOSEU NEGATIVE  HGBUR MODERATE*  BILIRUBINUR NEGATIVE  KETONESUR NEGATIVE  PROTEINUR 100*  NITRITE NEGATIVE  LEUKOCYTESUR MODERATE*      Imaging: Dg Chest 1 View  Result Date:  04/13/2019 CLINICAL DATA:  Initial evaluation for acute shortness of breath. EXAM: CHEST  1 VIEW COMPARISON:  Prior radiograph from 04/05/2019. FINDINGS: Mild cardiomegaly, stable. Mediastinal silhouette within normal limits. Lungs are hypoinflated. Secondary diffuse bronchovascular crowding. No focal infiltrates. No pulmonary edema or pleural effusion. No visible pneumothorax, although the patient's head partially obscures the upper lungs. No acute osseous finding. IMPRESSION: 1. Shallow lung inflation with secondary diffuse bronchovascular crowding. 2. No other active cardiopulmonary disease. Electronically Signed   By: Jeannine Boga M.D.   On: 04/13/2019 04:25   Ct Head Wo Contrast  Result Date: 04/13/2019 CLINICAL DATA:  Initial evaluation for acute altered mental status. EXAM: CT HEAD WITHOUT CONTRAST TECHNIQUE: Contiguous axial images were obtained from the base of the skull through the vertex without intravenous contrast. COMPARISON:  Prior head CT from 04/05/2019. FINDINGS: Brain: Atrophy with mild chronic small vessel ischemic disease again noted, stable.  No acute intracranial hemorrhage. No acute large vessel territory infarct. No mass lesion, midline shift or mass effect. No hydrocephalus. No extra-axial fluid collection. Vascular: No hyperdense vessel. Calcified atherosclerosis at the skull base. Skull: Scalp soft tissues and calvarium within normal limits. Sinuses/Orbits: Globes and orbital soft tissues demonstrate no acute finding. Mild chronic mucoperiosteal thickening noted within the ethmoidal air cells. Paranasal sinuses are otherwise clear. No mastoid effusion. Other: None. IMPRESSION: 1. No acute intracranial abnormality. 2. Stable atrophy with mild chronic small vessel ischemic disease. Electronically Signed   By: Jeannine Boga M.D.   On: 04/13/2019 04:23   US Renal  Result Date: 04/12/2019 CLINICAL DATA:  Acute renal failure EXAM: RENAL / URINARY TRACT ULTRASOUND COMPLETE  COMPARISON:  CT abdomen pelvis 01/18/2016 FINDINGS: Right Kidney: Renal measurements: 10.7 x 5.7 x 5.7 cm = volume: 179.4 mL. Slightly increased renal cortical echogenicity and renal sinus lipomatosis. No shadowing calculus, hydronephrosis or concerning renal mass. Left Kidney: Renal measurements: 10.6 x 6.1 x 5.3 cm = volume: 180 mL. More normal cortical echogenicity. Shadowing calculus is seen in the lower pole left kidney measuring 5 mm. No hydronephrosis or concerning renal mass. Bladder: Decompressed by Foley catheter and therefore poorly evaluated by sonography. Other: None. IMPRESSION: Nonobstructing left nephrolithiasis. Slightly increased right renal echogenicity, may reflect medical renal disease. Bladder decompressed by Foley catheter, poorly evaluated by sonography. Electronically Signed   By: Lovena Le M.D.   On: 04/12/2019 20:03   Dg Abdomen Acute W/chest  Result Date: 04/12/2019 CLINICAL DATA:  Vomiting, diarrhea. EXAM: DG ABDOMEN ACUTE W/ 1V CHEST COMPARISON:  01/20/2016 FINDINGS: Lungs are clear. Heart size is normal. No free air beneath either right or left hemidiaphragm. Stool and gas throughout the colon. No signs of bowel obstruction. No acute bone process. IMPRESSION: No acute cardiopulmonary disease. No acute intra-abdominal finding. Electronically Signed   By: Zetta Bills M.D.   On: 04/12/2019 12:49      Assessment & Plan: Pt is a 85 y.o. male with a PMHx of congestive heart failure, coronary artery disease, diabetes mellitus type 2, hyperlipidemia, hypertension, myocardial infarction, stroke, who was admitted to [facility] on 04/12/2019 for nausea, diarrhea, low grade fever.   1.  Acute kidney injury, suspect acute tubular necrosis. 2.  Severe hyperkalemia. 3.  Metabolic acidosis. 4.  Acute respiratory failure. 5.  Hypotension.  Plan: Patient with very severe illness at the moment.  Temporary dialysis catheter has been placed.  We have ordered CRRT with air-fluid target of  35 mL/kg/h.  Blood flow rate to be 300.  We will start the patient off on 0K bath.  Patient be maintained on sodium bicarbonate drip as well.  Overall prognosis very guarded at the moment given severe metabolic derangements, acidosis, acute kidney injury, and acute respiratory failure.  Patient has been made DNR.  Follow serum electrolytes closely.

## 2019-04-13 NOTE — Procedures (Signed)
Endotracheal Intubation: Patient required placement of an artificial airway secondary to Respiratory Failure  Consent: obrtained from [place] washing performed prior to starting the procedure.   Medications administered for sedation prior to procedure:   Rocuronium 60 mg IV, Fentanyl 100 mcg IV.    A time out procedure was called and correct patient, name, & ID confirmed. Needed supplies and equipment were assembled and checked to include ETT, 10 ml syringe, Glidescope, Mac and Miller blades, suction, oxygen and bag mask valve, end tidal CO2 monitor.   Patient was positioned to align the mouth and pharynx to facilitate visualization of the glottis.   Heart rate, SpO2 and blood pressure was continuously monitored during the procedure. Pre-oxygenation was conducted prior to intubation and endotracheal tube was placed through the vocal cords into the trachea.     The artificial airway was placed under direct visualization via glidescope route using a 8.0 ETT on the first attempt.  ETT was secured at 24 cm mark.  Placement was confirmed by auscuitation of lungs with good breath sounds bilaterally and no stomach sounds.  Condensation was noted on endotracheal tube.   Pulse ox 98%.  CO2 detector in place with appropriate color change.   Complications: None .   OperatorLanney Gins MD   Chest radiograph ordered and pending.   Comments: OGT placed via glidescope.   Ottie Glazier, M.D.  [facility] [place]

## 2019-04-13 NOTE — Progress Notes (Signed)
Abg results notified ,Maggie .Patria Mane NP

## 2019-04-13 NOTE — Consult Note (Addendum)
PULMONARY / CRITICAL CARE MEDICINE  Name: Philip Garrett MRN: [id number] DOB: 10/25/1931    LOS: 1  Referring Provider: Elizabeth Ouma, NP Reason for Referral: Severe septic shock and acute hypoxic respiratory failure Brief patient description: 85-year-old African-American male admitted from [facility] with nausea and vomiting x1 week, diarrhea, acute change in mental status, generalized weakness, hypotension and fever.  Found to be in acute renal failure, urosepsis and septic shock.  Patient decompensated this morning requiring emergency respiratory and blood pressure support.  Now in refractory shock with severe acidosis.  Emergently transferred to the ICU  HPI: This is an 85-year-old African-American male with a history of CHF, CAD, type 2 diabetes, hypertension, hyperlipidemia, myocardial infarction, cervical stenosis, and previous CVA who presented from a skilled nursing facility with nausea and vomiting for about a week, mild diarrhea, cough, mild fever, generalized weakness, acute changes in mental status and hypotension.  History is obtained from ED and EMS records as patient is currently encephalopathic and unable to provide any history.  Per ED and EMS records, patient has experienced a progressive decline over the last couple of weeks with increased generalized weakness and difficulty ambulating.  This is his fourth ED visit since February 22, 2019. His initial ED work-up revealed a potassium level of 5.8, creatinine of 6.96, mildly elevated LFTs, and a lactic acid of 9.6.  His WBC was unremarkable except for hemoglobin of 9.9 and a hematocrit of 31.0.  His urinalysis was abnormal with his urine having atopic back:, Many WBCs, RBCs and bacteria.  He was started on IV hydration, renal ultrasound was obtained and he was started on Rocephin.  While awaiting admission, patient became acutely hypoxic, hypotensive with worsening mental status.  He was emergently transferred to the ICU.  Upon  arrival in the ICU, patient's respirations were agonal, his blood pressure was 60/40, and his temperature was 33C.  His ABG showed a pH of 7.0, PCO2 less than 19, PO2 133 and a bicarb level of 3.  His chemistry showed a potassium level greater than 7.5, CO2 less than 7, glucose of 149, BUN of 71, creatinine of 7.64, calcium of 8.0, albumin of 2.6, AST of 89, ALT 56 and EGFR of 7.  His urine output has declined significantly with almost no urine output over the last 2 hours.  His lactic acid level is greater than 11 up from 9.6.  Procalcitonin is 1.4.  Family was contacted for concerns for hemodialysis catheter and intubation.  After discussion with patient's niece/POA Mrs. Wanda Thayer, she indicated that patient would not want to be hooked up to a life support machine.  Hence patient was made a DNR/DNI with continuation of medical management.  Risk and benefits of CVVHD as well as placement of a hemodialysis catheter were explained to her and she was willing to proceed with these procedures.  A right femoral hemodialysis catheter was placed.  Patient has been seen by nephrology.  He is currently on a bicarb infusion at 100 cc an hour.  Maintenance fluids have been stopped due to increase pulmonary congestion and risk of pulmonary edema given patient's history of congestive heart failure.  Antibiotics have been broadened to include vancomycin and Flagyl.   Past Medical History:  Diagnosis Date  . Cervical stenosis of spinal canal   . CHF (congestive heart failure) (HCC)   . Coronary artery disease   . Diabetes mellitus without complication (HCC)   . Hyperlipidemia   . Hypertension   . Myocardial   infarction (HCC)   . Stroke (HCC)    TIA   Past Surgical History:  Procedure Laterality Date  . AMPUTATION TOE Left 10/30/2015   Procedure: AMPUTATION LEFT SECOND TOE/mpj left 2nd;  Surgeon: Todd Cline, DPM;  Location: [facility] ORS;  Service: Podiatry;  Laterality: Left;  . CORONARY ANGIOPLASTY    . TOE  AMPUTATION Left    great toe   No current facility-administered medications on file prior to encounter.    Current Outpatient Medications on File Prior to Encounter  Medication Sig  . acetaminophen (TYLENOL) 500 MG tablet Take 1,000 mg by mouth every 4 (four) hours as needed for mild pain or moderate pain.   . amLODipine (NORVASC) 5 MG tablet Take 5 mg by mouth daily.   . atorvastatin (LIPITOR) 20 MG tablet Take 20 mg by mouth at bedtime.   . carvedilol (COREG) 25 MG tablet Take 25 mg by mouth 2 (two) times daily with a meal.   . clopidogrel (PLAVIX) 75 MG tablet Take 75 mg by mouth daily.  . fluticasone (FLONASE) 50 MCG/ACT nasal spray Place 1 spray into both nostrils daily.  . insulin aspart (NOVOLOG) 100 UNIT/ML injection Inject 0-10 Units into the skin See admin instructions. Inject under the skin before meals according to sliding scale:  <150: 0u 150-200: 2u 201-250: 4u 251-300: 6u 301-350: 8u 351-400: 10u  . Insulin Detemir (LEVEMIR FLEXTOUCH) 100 UNIT/ML Pen Inject 10 Units into the skin daily.   . latanoprost (XALATAN) 0.005 % ophthalmic solution Place 1 drop into both eyes at bedtime.  . linagliptin (TRADJENTA) 5 MG TABS tablet Take 5 mg by mouth daily.  . loratadine (CLARITIN) 10 MG tablet Take 10 mg by mouth daily as needed for allergies.   . metFORMIN (GLUCOPHAGE) 1000 MG tablet Take 1,000 mg by mouth 2 (two) times daily.  . montelukast (SINGULAIR) 10 MG tablet Take 10 mg by mouth daily.   . ramipril (ALTACE) 10 MG capsule Take 10 mg by mouth daily.    Allergies No Known Allergies  Family History Family History  Problem Relation Age of Onset  . Diabetes Other    Social History  reports that he has never smoked. His smokeless tobacco use includes snuff and chew. He reports that he does not drink alcohol or use drugs.  Review Of Systems: unable to obtain as patient is currently somnolent.  VITAL SIGNS: BP (!) 85/47   Pulse 71   Temp (!) 91.9 F (33.3 C)    Resp (!) 21   Ht 6' 4" (1.93 m)   Wt 127 kg   SpO2 100%   BMI 34.08 kg/m   HEMODYNAMICS:    VENTILATOR SETTINGS:    INTAKE / OUTPUT: I/O last 3 completed shifts: In: 1100 [I.V.:1000; IV Piggyback:100] Out: -   PHYSICAL EXAMINATION: General: acutely ill-looking HEENT: [place]/AT, PERRLA, trachea midline, mild JVD Neuro: Limited due to patient's mental status.  Awake, somnolent, unable to follow commands, moves all extremities Cardiovascular: Apical pulse regular, S1-S2, no murmur regurg or gallop, +2 pulses bilaterally, +2 nonpitting edema bilaterally, significant venous stasis discoloration in bilateral lower extremities Lungs: Respirations shallow, tachypneic, bilateral breath sounds with diffuse crackles and rhonchi in all lung fields Abdomen: Nondistended, hypoactive bowel sounds in all 4 quadrants, palpation reveals no organomegaly no tenderness Musculoskeletal: Positive range of motion, amputated toes, no joint deformities Skin: Dry, cool to touch  LABS:  BMET Recent Labs  Lab 04/12/19 1154 04/13/19 0355  NA 131* 135  K 5.8* >7.5*    CL 95* 101  CO2 11* <7*  BUN 60* 71*  CREATININE 6.96* 7.64*  GLUCOSE 140* 149*    Electrolytes Recent Labs  Lab 04/12/19 1154 04/13/19 0355  CALCIUM 8.9 8.0*    CBC Recent Labs  Lab 04/12/19 1154 04/13/19 0355  WBC 9.0 8.4  HGB 9.9* 9.3*  HCT 31.0* 30.6*  PLT 259 316    Coag's No results for input(s): APTT, INR in the last 168 hours.  Sepsis Markers Recent Labs  Lab 04/12/19 1530 04/12/19 2339 04/13/19 0355 04/13/19 0552  LATICACIDVEN 10.7* >11.0*  --  >11.0*  PROCALCITON  --   --  1.48  --     ABG Recent Labs  Lab 04/13/19 0348  PHART 7.00*  PCO2ART <19.0*  PO2ART 133*    Liver Enzymes Recent Labs  Lab 04/12/19 1154 04/13/19 0355  AST 87* 89*  ALT 48* 56*  ALKPHOS 68 60  BILITOT 0.7 0.7  ALBUMIN 3.1* 2.6*    Cardiac Enzymes No results for input(s): TROPONINI, PROBNP in the last 168  hours.  Glucose Recent Labs  Lab 04/12/19 2314 04/12/19 2334 04/13/19 0008 04/13/19 0423 04/13/19 0537  GLUCAP 62* 169* 156* 145* 158*    Imaging Dg Chest 1 View  Result Date: 04/13/2019 CLINICAL DATA:  Initial evaluation for acute shortness of breath. EXAM: CHEST  1 VIEW COMPARISON:  Prior radiograph from 04/05/2019. FINDINGS: Mild cardiomegaly, stable. Mediastinal silhouette within normal limits. Lungs are hypoinflated. Secondary diffuse bronchovascular crowding. No focal infiltrates. No pulmonary edema or pleural effusion. No visible pneumothorax, although the patient's head partially obscures the upper lungs. No acute osseous finding. IMPRESSION: 1. Shallow lung inflation with secondary diffuse bronchovascular crowding. 2. No other active cardiopulmonary disease. Electronically Signed   By: Benjamin  McClintock M.D.   On: 04/13/2019 04:25   Ct Head Wo Contrast  Result Date: 04/13/2019 CLINICAL DATA:  Initial evaluation for acute altered mental status. EXAM: CT HEAD WITHOUT CONTRAST TECHNIQUE: Contiguous axial images were obtained from the base of the skull through the vertex without intravenous contrast. COMPARISON:  Prior head CT from 04/05/2019. FINDINGS: Brain: Atrophy with mild chronic small vessel ischemic disease again noted, stable. No acute intracranial hemorrhage. No acute large vessel territory infarct. No mass lesion, midline shift or mass effect. No hydrocephalus. No extra-axial fluid collection. Vascular: No hyperdense vessel. Calcified atherosclerosis at the skull base. Skull: Scalp soft tissues and calvarium within normal limits. Sinuses/Orbits: Globes and orbital soft tissues demonstrate no acute finding. Mild chronic mucoperiosteal thickening noted within the ethmoidal air cells. Paranasal sinuses are otherwise clear. No mastoid effusion. Other: None. IMPRESSION: 1. No acute intracranial abnormality. 2. Stable atrophy with mild chronic small vessel ischemic disease.  Electronically Signed   By: Benjamin  McClintock M.D.   On: 04/13/2019 04:23   Us Renal  Result Date: 04/12/2019 CLINICAL DATA:  Acute renal failure EXAM: RENAL / URINARY TRACT ULTRASOUND COMPLETE COMPARISON:  CT abdomen pelvis 01/18/2016 FINDINGS: Right Kidney: Renal measurements: 10.7 x 5.7 x 5.7 cm = volume: 179.4 mL. Slightly increased renal cortical echogenicity and renal sinus lipomatosis. No shadowing calculus, hydronephrosis or concerning renal mass. Left Kidney: Renal measurements: 10.6 x 6.1 x 5.3 cm = volume: 180 mL. More normal cortical echogenicity. Shadowing calculus is seen in the lower pole left kidney measuring 5 mm. No hydronephrosis or concerning renal mass. Bladder: Decompressed by Foley catheter and therefore poorly evaluated by sonography. Other: None. IMPRESSION: Nonobstructing left nephrolithiasis. Slightly increased right renal echogenicity, may reflect medical renal disease. Bladder   decompressed by Foley catheter, poorly evaluated by sonography. Electronically Signed   By: Price  DeHay M.D.   On: 04/12/2019 20:03   Dg Abdomen Acute W/chest  Result Date: 04/12/2019 CLINICAL DATA:  Vomiting, diarrhea. EXAM: DG ABDOMEN ACUTE W/ 1V CHEST COMPARISON:  01/20/2016 FINDINGS: Lungs are clear. Heart size is normal. No free air beneath either right or left hemidiaphragm. Stool and gas throughout the colon. No signs of bowel obstruction. No acute bone process. IMPRESSION: No acute cardiopulmonary disease. No acute intra-abdominal finding. Electronically Signed   By: Geoffrey  Wile M.D.   On: 04/12/2019 12:49   STUDIES:  Renal ultrasound 04/12/2019: Nonobstructing left nephrolithiasis  CULTURES: Blood cultures x2 Urine culture  ANTIBIOTICS: Vancomycin 04/13/2019> Ceftriaxone 04/12/2019> Flagyl 04/13/2019>  SIGNIFICANT EVENTS: 04/12/2019: Admitted 04/13/2019: Emergently transferred to the ICU for refractory septic shock  LINES/TUBES: Right femoral dialysis catheter 04/13/2019> Foley  catheter 04/12/2019> Peripheral IVs  DISCUSSION: 85-year-old African-American male presenting with urosepsis and severe septic shock requiring pressors, severe hyperkalemia, acute renal failure requiring emergent dialysis, acute respiratory failure requiring BiPAP, severe metabolic encephalopathy and severe lactic acidosis.  ASSESSMENT / PLAN:  PULMONARY A: Acute hypoxic respiratory failure Severe lactic acidosis P:   Continuous BiPAP with minimal settings and titrate to nasal cannula as tolerated ABG and chest X-ray as needed Nebulized bronchodilator as tolerated Do not intubate per family CVVHD per nephrology  CARDIOVASCULAR A:  Severe septic shock Elevated troponin likely due to demand ischemia/sepsis Hx: MI, hypertension, CAD, CHF, and hyperlipdemia P:  Hemodynamic monitoring per ICU protocol IV fluids and pressors to maintain mean arterial BP >65 Solu-cortef for refractory shock Trend cardiac enzymes  RENAL A:   Acute renal failure  UTI/Urosepsis  Hyperkalemia P:   Antibiotics as above Bicarb infusion  Nephrology consulted and following CVVHD per nephrology Trend renal renal indices Monitor and correct electrolytes  GASTROINTESTINAL A:   Nausea and vomiting Diarrhea Elevated LFTs P:   Stool for C-diff if persistent diarrhea IV flagyl CT abdomen and pelvis without contrast Trend LFTs  HEMATOLOGIC A:   Chronic anemia P:  Trend H/H and transfuse as needed for Hg<7  INFECTIOUS A:   Severe urosepsis/UTI P:   F/U cultures Antibiotics as above Trend procalcitonin  ENDOCRINE A:   Type 2 Diabetes Mellitus   P:   Blood glucose monitoring with sliding scale insulin coverage Treat hypoglycemia per protocol Hold home medciations  NEUROLOGIC A:   Acute metabolic encephalopathy-CT  Cervical cord compression-spinal stenosis at C3/C4and C4/C5 with possible cord compression P:   Monitor neuro status Check ammonia level Continue to treat underlying  causes Patient is too unstable for any neurosurgical intervention; conservative management for now and consider neurosurgical evaluation if family agrees once patient is much improved and stable Best Practice: Code Status:DNR/DNI Diet: NPO until mental status improves and then resume carb modified diet as tolerated GI prophylaxis: pepcid VTE prophylaxis: SQ heparin and SCDs  FAMILY  - Updates: Spoke at length with patient's niece Wander Popelka. Reviewed current patient's status, likely prognosis and treatment plan. She indicated that she does not want patient to be in excruciating pain. She would not want him put on life support or undergo CPR. She agrees with CRRT to treat his acidosis and all medical treatment for sepsis and septic shock  Total time spent evaluating patient, establishing treatment plan and communicating with family and staff is 120 minutes   S. Tukov-Yual ANP-BC Pulmonary and Critical Care Medicine [facility] Pager [phone] or [phone]  NB: This document   was prepared using Systems analyst and may include unintentional dictation errors.    04/13/2019, 6:59 AM

## 2019-04-13 NOTE — Progress Notes (Signed)
eLink Physician-Brief Progress Note Patient Name: WILLIM BREGMAN DOB: 1931-10-19 MRN: [id number]   Date of Service  04/13/2019  HPI/Events of Note  59 M HTN, DM dyslipidemia with general decline in health for the past few, resides in an assisted living facility. Brought in due to generalized weakness, nausea, vomiting diarrhea. Hypotensive, hypothermic, hypoglycemic in AKI. K now > 7.5, lactic acid >11, bicarb <7, and barely responsive on BiPap.  eICU Interventions   He has been started on bicarb drip, ordered calcium, D50/insulin and a bicarb push  For intubation  Bedside team calling nephrology for dialysis for severe metabolic acidosis, hyperkalemia and uremia. Will need access  Antibiotics for UTI     Intervention Category Major Interventions: Respiratory failure - evaluation and management;Change in mental status - evaluation and management;Shock - evaluation and management;Infection - evaluation and management;Electrolyte abnormality - evaluation and management Evaluation Type: New Patient Evaluation  Judd Lien 04/13/2019, 6:01 AM

## 2019-04-13 NOTE — ED Notes (Signed)
Patient transported to CT 

## 2019-04-13 NOTE — Progress Notes (Addendum)
Interim History  Subjective: Per ED staff, patient with worsening mental status, He is noted to be hypotensive as low as 94/45, IVFs administered. Patient is also hypothermic with temp as low as 91.9 per rectal, bair hugger was placed. Urine output <300 ml since 7pm also noted to be bloody. CBG was low 62 therefore received D50 with improvement noted.  Objective: On arrival to the bedside, he was hypothermic with blood pressure 85/47 mm Hg and pulse rate73 beats/min. There were no obvious focal neurological deficits; opens eyes to deep sternal rub and able to weakly localized pain and withdraw lower extremities to noxious stimuli. Noted with snoring respiration without use of accessory muscles.  Assessment: 85 y.o male with past medical hx of HTN, HLD, CVA, CAD, MI, CHF, IDDM and cervical myelopathy presenting with productive cough, nausea/vomiting, intermittent fever and diarrhea.  Plan:  1.Sepsis - Patient meets sepsis criteria (Hypotension, hypothermia, lactic acidosis, decreased urine output, altered mental status) - suspect sepsis due to UTI possible acute pyelonephritis  - Transfer to ICU  - Check chest x-ray  - Trend lactic acid - Covid negative - Check procalcitonin - Check CT abdomen/pelvis eval for possible mesenteric ischemia or colitis, pyelonephritis as patient presenting with n/v and diarrhea, and bloody urine now hypotensive - Check ABGs as he is snoring may need airway protection - UA shows evidence of UTI - Urine cultures pending - Blood cultures pending - Start Empiric abx  - IVFs and PRN bolus to keep MAP<6mmHg or SBP <56mmHg - Start Levophed gtt for hypotension titrate to keep MAP>65  2. Acute Encephalopathy - Etiology likely metabolic in the setting of AKI - Check CT head without contrast - Avoid sedatives - Neurochecks - Treat underlying cause  3. Acute Kidney Injury - BUN/Cr. 60/6.96 with Hyperkalemia and Elevated anion gap metabolic acidosis and Uremia -  Avoid Nephrotoxins - Start Sodium Bicarb gtt - Serial BMP + mag and Phos - Renal ultrasound shows nonobstructive left nephrolithiasis - nephrology consult. I discussed with on call Nephrologist Dr. Holley Raring for emergent HD given worsening hyperkalemia and renal function on repeat chemistry.  4. Elevated LFTs - Check hepatitis panel - LDH - Lipid profile, CK  - Trend LFTs  5. Elevated Troponin - Likely demand ischemia - Trend troponin  6. Cervical Cord compression - MRI cervical spine (10/31)concerning for severe spinal canal stenosis at C3/C4 and C4/C5 with cord compression - Evaluated by Neurosurgery at [facility] , no intervention recommended - Outpatient f/u with Neurosurgery  7. Diabetes Mellitus Type 2 with complications - Now hypoglycemic requiring Dextrose - Holding metformin, linagliptin, and Levemir  - SSI - Treat Hypoglycemia per protocol   DVT prophylaxis - Switch to Heparin subq given AKI     Rufina Falco, DNP, CCRN, FNP-C [facility] Nurse Practitioner Between 7am to 6pm - Pager - [phone]  [facility]  Office  [phone]

## 2019-04-13 NOTE — ED Notes (Signed)
Admitting md at the bedside.  

## 2019-04-13 NOTE — ED Notes (Signed)
Placed Bair Hugger on pt reposition pt on bed, performed perineal care pt incontinent of stool, stool loose brown in color.

## 2019-04-13 NOTE — ED Notes (Signed)
Pt resting RN changed pt's linen and incontinent pad, pt incontinent of stool. Performed perineal care, Pt with assistance from RN able to hold from hand rail to move from side to side.

## 2019-04-13 NOTE — Progress Notes (Signed)
Patient was started on CRRT this am approximately 0945- around lunch time CRRT machine had a malfunction and CRRT was stopped- patient sent to CT during this downtime from CRRT- and CRRT resumed approximately 1500- During this time- patient became less responsive/more lethargic and continued to be tachypneic in the 30's.  Relayed my assessment to Dr. Lanney Gins- he spoke to patient's niece- Mariann Laster who decided to keep patient's code status as DNR- but ok to intubate patient at this time. Dr. Lanney Gins intubated patient.  Around 1800-  Patient's trialysis line clotted off- now orders in for TPA to dwell in cath- so that CRRT can be initiated again.

## 2019-04-13 NOTE — Progress Notes (Signed)
   04/13/19 0735  Clinical Encounter Type  Visited With Family;Patient and family together;Health care provider  Visit Type Initial  Referral From Palliative care team  Spiritual Encounters  Spiritual Needs Prayer;Emotional;Grief support  Stress Factors  Family Stress Factors Health changes;Loss   This chaplain received a referral to support the patient's niece Mariann Laster). Upon arrival, the patient's niece was at the bedside. The patient's niece shared that she has been caregiver for her uncle and that she feeling the weight of this realization of his prognosis. The patient's niece shared that the patient was a lover of cars, long walks, and his chewing tobacco. This chaplain provided support in the form of active and reflective listening, compassionate presence, theological reflection, facilitation of life review, and prayer. The patient's niece requested prayer for the patient's peace and comfort and expressed gratitude for the visit and support offered. The patient's niece is open to support during this difficult time.

## 2019-04-13 NOTE — Progress Notes (Addendum)
Ch visited with pt as a f/u. Pt's niece was at bedside. Pt is an 85 y.o. that was receiving dialysis trt upon ch entry with Bipap. Ch provided compassionate presence and social support for pt's niece who shared that the pt is the only living sibling on her father side of the family. The pt was in an assisted living facility and was transferred to a rehab facility. The pt suddenly declined with in the last few weeks going from being independent, to using a walker and finally towards being wheelchair bound. Ch asked guided questions and allowed space for pt's niece to share her concerns about the current status of her uncle (pt) that is being treated for a UTI and awaiting further info in his plans of care. F/u support encouraged     04/13/19 1100  Clinical Encounter Type  Visited With Health care provider;Patient and family together  Visit Type Spiritual support;Social support;Follow-up;Critical Care  Referral From Chaplain  Consult/Referral To Chaplain  Spiritual Encounters  Spiritual Needs Grief support;Emotional  Stress Factors  Patient Stress Factors Exhausted;Health changes;Loss of control  Family Stress Factors Major life changes

## 2019-04-13 NOTE — Care Plan (Addendum)
Spoke to [facility] , reviewed care plan. Discussed patient is with labored breathing and may pass away soon.  I suspect metformin toxicity with concurrent Ecoli UTI induced sepsis. He needs more time with CRRT to have any chance of survival and currently respiratory status is compromised. We discussed options and she wants patient to have trial on mechanical intubation until renal impairment resolves with plan to de-escalate to comfort measures if this does not help after few days.    Ottie Glazier, M.D.  Pulmonary & [place]

## 2019-04-14 ENCOUNTER — Inpatient Hospital Stay: Payer: Medicare Other

## 2019-04-14 LAB — MAGNESIUM
Magnesium: 1.8 mg/dL (ref 1.7–2.4)
Magnesium: 1.8 mg/dL (ref 1.7–2.4)

## 2019-04-14 LAB — BLOOD GAS, ARTERIAL
Acid-base deficit: 6 mmol/L — ABNORMAL HIGH (ref 0.0–2.0)
Bicarbonate: 17.6 mmol/L — ABNORMAL LOW (ref 20.0–28.0)
FIO2: 0.4
MECHVT: 500 mL
Mechanical Rate: 15
O2 Saturation: 99.4 %
PEEP: 5 cmH2O
Patient temperature: 37
pCO2 arterial: 29 mmHg — ABNORMAL LOW (ref 32.0–48.0)
pH, Arterial: 7.39 (ref 7.350–7.450)
pO2, Arterial: 162 mmHg — ABNORMAL HIGH (ref 83.0–108.0)

## 2019-04-14 LAB — RENAL FUNCTION PANEL
Albumin: 2.6 g/dL — ABNORMAL LOW (ref 3.5–5.0)
Albumin: 2.7 g/dL — ABNORMAL LOW (ref 3.5–5.0)
Albumin: 2.6 g/dL — ABNORMAL LOW (ref 3.5–5.0)
Albumin: 2.8 g/dL — ABNORMAL LOW (ref 3.5–5.0)
Albumin: 2.7 g/dL — ABNORMAL LOW (ref 3.5–5.0)
Albumin: 2.7 g/dL — ABNORMAL LOW (ref 3.5–5.0)
Albumin: 2.5 g/dL — ABNORMAL LOW (ref 3.5–5.0)
Anion gap: 29 — ABNORMAL HIGH (ref 5–15)
Anion gap: 23 — ABNORMAL HIGH (ref 5–15)
Anion gap: UNDETERMINED (ref 5–15)
Anion gap: 18 — ABNORMAL HIGH (ref 5–15)
Anion gap: 14 (ref 5–15)
Anion gap: 12 (ref 5–15)
Anion gap: 13 (ref 5–15)
BUN: 62 mg/dL — ABNORMAL HIGH (ref 8–23)
BUN: 57 mg/dL — ABNORMAL HIGH (ref 8–23)
BUN: 64 mg/dL — ABNORMAL HIGH (ref 8–23)
BUN: 54 mg/dL — ABNORMAL HIGH (ref 8–23)
BUN: 37 mg/dL — ABNORMAL HIGH (ref 8–23)
BUN: 40 mg/dL — ABNORMAL HIGH (ref 8–23)
BUN: 37 mg/dL — ABNORMAL HIGH (ref 8–23)
CO2: 8 mmol/L — ABNORMAL LOW (ref 22–32)
CO2: 15 mmol/L — ABNORMAL LOW (ref 22–32)
CO2: <7 mmol/L — ABNORMAL LOW (ref 22–32)
CO2: 20 mmol/L — ABNORMAL LOW (ref 22–32)
CO2: 24 mmol/L (ref 22–32)
CO2: 25 mmol/L (ref 22–32)
CO2: 25 mmol/L (ref 22–32)
Calcium: 7.2 mg/dL — ABNORMAL LOW (ref 8.9–10.3)
Calcium: 7 mg/dL — ABNORMAL LOW (ref 8.9–10.3)
Calcium: 7.2 mg/dL — ABNORMAL LOW (ref 8.9–10.3)
Calcium: 7 mg/dL — ABNORMAL LOW (ref 8.9–10.3)
Calcium: 7 mg/dL — ABNORMAL LOW (ref 8.9–10.3)
Calcium: 6.7 mg/dL — ABNORMAL LOW (ref 8.9–10.3)
Calcium: 6.8 mg/dL — ABNORMAL LOW (ref 8.9–10.3)
Chloride: 98 mmol/L (ref 98–111)
Chloride: 102 mmol/L (ref 98–111)
Chloride: 102 mmol/L (ref 98–111)
Chloride: 100 mmol/L (ref 98–111)
Chloride: 99 mmol/L (ref 98–111)
Chloride: 98 mmol/L (ref 98–111)
Chloride: 99 mmol/L (ref 98–111)
Creatinine, Ser: 6.06 mg/dL — ABNORMAL HIGH (ref 0.61–1.24)
Creatinine, Ser: 5.04 mg/dL — ABNORMAL HIGH (ref 0.61–1.24)
Creatinine, Ser: 5.95 mg/dL — ABNORMAL HIGH (ref 0.61–1.24)
Creatinine, Ser: 4.13 mg/dL — ABNORMAL HIGH (ref 0.61–1.24)
Creatinine, Ser: 2.78 mg/dL — ABNORMAL HIGH (ref 0.61–1.24)
Creatinine, Ser: 3.04 mg/dL — ABNORMAL HIGH (ref 0.61–1.24)
Creatinine, Ser: 2.9 mg/dL — ABNORMAL HIGH (ref 0.61–1.24)
GFR calc Af Amer: 9 mL/min — ABNORMAL LOW (ref >60)
GFR calc Af Amer: 11 mL/min — ABNORMAL LOW (ref >60)
GFR calc Af Amer: 9 mL/min — ABNORMAL LOW (ref >60)
GFR calc Af Amer: 14 mL/min — ABNORMAL LOW (ref >60)
GFR calc Af Amer: 23 mL/min — ABNORMAL LOW (ref >60)
GFR calc Af Amer: 21 mL/min — ABNORMAL LOW (ref >60)
GFR calc Af Amer: 22 mL/min — ABNORMAL LOW (ref >60)
GFR calc non Af Amer: 8 mL/min — ABNORMAL LOW (ref >60)
GFR calc non Af Amer: 10 mL/min — ABNORMAL LOW (ref >60)
GFR calc non Af Amer: 8 mL/min — ABNORMAL LOW (ref >60)
GFR calc non Af Amer: 12 mL/min — ABNORMAL LOW (ref >60)
GFR calc non Af Amer: 20 mL/min — ABNORMAL LOW (ref >60)
GFR calc non Af Amer: 18 mL/min — ABNORMAL LOW (ref >60)
GFR calc non Af Amer: 19 mL/min — ABNORMAL LOW (ref >60)
Glucose, Bld: 275 mg/dL — ABNORMAL HIGH (ref 70–99)
Glucose, Bld: 287 mg/dL — ABNORMAL HIGH (ref 70–99)
Glucose, Bld: 288 mg/dL — ABNORMAL HIGH (ref 70–99)
Glucose, Bld: 196 mg/dL — ABNORMAL HIGH (ref 70–99)
Glucose, Bld: 156 mg/dL — ABNORMAL HIGH (ref 70–99)
Glucose, Bld: 140 mg/dL — ABNORMAL HIGH (ref 70–99)
Glucose, Bld: 138 mg/dL — ABNORMAL HIGH (ref 70–99)
Phosphorus: 7.8 mg/dL — ABNORMAL HIGH (ref 2.5–4.6)
Phosphorus: 6.5 mg/dL — ABNORMAL HIGH (ref 2.5–4.6)
Phosphorus: 8.4 mg/dL — ABNORMAL HIGH (ref 2.5–4.6)
Phosphorus: 5 mg/dL — ABNORMAL HIGH (ref 2.5–4.6)
Phosphorus: 3.5 mg/dL (ref 2.5–4.6)
Phosphorus: 3.6 mg/dL (ref 2.5–4.6)
Phosphorus: 3.2 mg/dL (ref 2.5–4.6)
Potassium: 5.6 mmol/L — ABNORMAL HIGH (ref 3.5–5.1)
Potassium: 4.1 mmol/L (ref 3.5–5.1)
Potassium: 5.8 mmol/L — ABNORMAL HIGH (ref 3.5–5.1)
Potassium: 3.2 mmol/L — ABNORMAL LOW (ref 3.5–5.1)
Potassium: 3.6 mmol/L (ref 3.5–5.1)
Potassium: 4.5 mmol/L (ref 3.5–5.1)
Potassium: 4.1 mmol/L (ref 3.5–5.1)
Sodium: 135 mmol/L (ref 135–145)
Sodium: 140 mmol/L (ref 135–145)
Sodium: 137 mmol/L (ref 135–145)
Sodium: 138 mmol/L (ref 135–145)
Sodium: 137 mmol/L (ref 135–145)
Sodium: 135 mmol/L (ref 135–145)
Sodium: 137 mmol/L (ref 135–145)

## 2019-04-14 LAB — CBC WITH DIFFERENTIAL/PLATELET
Abs Immature Granulocytes: 0.18 10*3/uL — ABNORMAL HIGH (ref 0.00–0.07)
Basophils Absolute: 0 10*3/uL (ref 0.0–0.1)
Basophils Relative: 0 %
Eosinophils Absolute: 0 10*3/uL (ref 0.0–0.5)
Eosinophils Relative: 0 %
HCT: 24.1 % — ABNORMAL LOW (ref 39.0–52.0)
Hemoglobin: 7.8 g/dL — ABNORMAL LOW (ref 13.0–17.0)
Immature Granulocytes: 1 %
Lymphocytes Relative: 7 %
Lymphs Abs: 1 10*3/uL (ref 0.7–4.0)
MCH: 28.1 pg (ref 26.0–34.0)
MCHC: 32.4 g/dL (ref 30.0–36.0)
MCV: 86.7 fL (ref 80.0–100.0)
Monocytes Absolute: 1.5 10*3/uL — ABNORMAL HIGH (ref 0.1–1.0)
Monocytes Relative: 11 %
Neutro Abs: 10.9 10*3/uL — ABNORMAL HIGH (ref 1.7–7.7)
Neutrophils Relative %: 81 %
Platelets: 234 10*3/uL (ref 150–400)
RBC: 2.78 MIL/uL — ABNORMAL LOW (ref 4.22–5.81)
RDW: 15.8 % — ABNORMAL HIGH (ref 11.5–15.5)
WBC: 13.6 10*3/uL — ABNORMAL HIGH (ref 4.0–10.5)
nRBC: 0 % (ref 0.0–0.2)

## 2019-04-14 LAB — HEPATIC FUNCTION PANEL
ALT: 108 U/L — ABNORMAL HIGH (ref 0–44)
AST: 159 U/L — ABNORMAL HIGH (ref 15–41)
Albumin: 2.7 g/dL — ABNORMAL LOW (ref 3.5–5.0)
Alkaline Phosphatase: 54 U/L (ref 38–126)
Bilirubin, Direct: <0.1 mg/dL (ref 0.0–0.2)
Total Bilirubin: 0.7 mg/dL (ref 0.3–1.2)
Total Protein: 5.5 g/dL — ABNORMAL LOW (ref 6.5–8.1)

## 2019-04-14 LAB — LACTIC ACID, PLASMA
Lactic Acid, Venous: 6.6 mmol/L (ref 0.5–1.9)
Lactic Acid, Venous: 5.6 mmol/L (ref 0.5–1.9)
Lactic Acid, Venous: 3.3 mmol/L (ref 0.5–1.9)
Lactic Acid, Venous: 2.7 mmol/L (ref 0.5–1.9)
Lactic Acid, Venous: 2.4 mmol/L (ref 0.5–1.9)

## 2019-04-14 LAB — URINE CULTURE: Culture: >=100000 — AB

## 2019-04-14 LAB — TROPONIN I (HIGH SENSITIVITY): Troponin I (High Sensitivity): 25 ng/L — ABNORMAL HIGH (ref <18)

## 2019-04-14 LAB — GLUCOSE, CAPILLARY
Glucose-Capillary: 198 mg/dL — ABNORMAL HIGH (ref 70–99)
Glucose-Capillary: 162 mg/dL — ABNORMAL HIGH (ref 70–99)
Glucose-Capillary: 138 mg/dL — ABNORMAL HIGH (ref 70–99)
Glucose-Capillary: 115 mg/dL — ABNORMAL HIGH (ref 70–99)
Glucose-Capillary: 148 mg/dL — ABNORMAL HIGH (ref 70–99)

## 2019-04-14 LAB — PROCALCITONIN: Procalcitonin: 0.92 ng/mL

## 2019-04-14 MED ORDER — POTASSIUM CHLORIDE 10 MEQ/100ML IV SOLN
10.0000 meq | INTRAVENOUS | Status: AC
  Administered 2019-04-14 (×2): 10 meq via INTRAVENOUS
  Filled 2019-04-14 (×2): qty 100

## 2019-04-14 MED ORDER — PRISMASOL BGK 4/2.5 32-4-2.5 MEQ/L REPLACEMENT SOLN
Status: DC
  Administered 2019-04-14 – 2019-04-16 (×13): via INTRAVENOUS_CENTRAL
  Filled 2019-04-14: qty 5000

## 2019-04-14 MED ORDER — PRISMASOL BGK 4/2.5 32-4-2.5 MEQ/L REPLACEMENT SOLN
Status: DC
  Administered 2019-04-14 – 2019-04-16 (×6): via INTRAVENOUS_CENTRAL
  Filled 2019-04-14: qty 5000

## 2019-04-14 MED ORDER — PANTOPRAZOLE SODIUM 40 MG IV SOLR
40.0000 mg | INTRAVENOUS | Status: DC
  Administered 2019-04-14 – 2019-04-15 (×2): 40 mg via INTRAVENOUS
  Filled 2019-04-14 (×2): qty 40

## 2019-04-14 MED ORDER — PRISMASOL BGK 4/2.5 32-4-2.5 MEQ/L IV SOLN
INTRAVENOUS | Status: DC
  Administered 2019-04-14 – 2019-04-16 (×20): via INTRAVENOUS_CENTRAL
  Filled 2019-04-14: qty 5000

## 2019-04-14 MED ORDER — MIDAZOLAM HCL 2 MG/2ML IJ SOLN
2.0000 mg | INTRAMUSCULAR | Status: DC | PRN
  Administered 2019-04-14 (×2): 4 mg via INTRAVENOUS
  Administered 2019-04-15 – 2019-04-18 (×4): 2 mg via INTRAVENOUS
  Filled 2019-04-14: qty 2
  Filled 2019-04-14: qty 4
  Filled 2019-04-14: qty 2
  Filled 2019-04-14: qty 4
  Filled 2019-04-14 (×2): qty 2

## 2019-04-14 MED ORDER — CHLORHEXIDINE GLUCONATE CLOTH 2 % EX PADS
6.0000 | MEDICATED_PAD | Freq: Every day | CUTANEOUS | Status: DC
  Administered 2019-04-15 – 2019-04-17 (×4): 6 via TOPICAL

## 2019-04-14 NOTE — Progress Notes (Signed)
Initial Nutrition Assessment  RD working remotely.  DOCUMENTATION CODES:   Not applicable  INTERVENTION:  If tube feeds are initiated recommend providing Vital 1.5 Cal at 40 mL/hr (960 mL goal daily volume) + Pro-Stat 60 mL TID per tube. Provides 2040 kcal, 155 grams of protein, 730 mL H2O daily.  If tube feeds are initiated provide minimum free water flush of 30 mL Q4hrs to maintain tube patency and B-complex with C daily per tube.  NUTRITION DIAGNOSIS:   Inadequate oral intake related to inability to eat as evidenced by NPO status.  GOAL:   Provide needs based on ASPEN/SCCM guidelines  MONITOR:   Vent status, Labs, Weight trends, I & O's  REASON FOR ASSESSMENT:   Ventilator    ASSESSMENT:   85 year old male with PMHx of DM, HTN, CHF, HLD, CAD, hx MI, hx TIA admitted with AKI suspected to be related to acute tubular necrosis requiring CRRT, severe hyperkalemia, metabolic acidosis, acute hypoxic respiratory failure requiring intubation on 11/7.   Patient intubated and sedated. On PRVC mode with FiO2 30% and PEEP 5 cmH2O.  Abdomen soft per RN documentation. Patient is on CRRT. Plan is to hold off on initiation of tube feeds at this time.  Enteral Access: OGT placed 11/7; terminates in gastric body per abdominal x-ray 11/7: 55 cm at corner of mouth  MAP: 67-86 mmHg  Patient is currently intubated on ventilator support Ve: 8.6 L/min Temp (24hrs), Avg:97.4 F (36.3 C), Min:95.4 F (35.2 C), Max:98.4 F (36.9 C)  Propofol: N/A  Medications reviewed and include: Novolog 0-15 units Q4hrs, pantoprazole, ceftriaxone, famotidine, fentanyl gtt, Flagyl, norepinephrine gtt at 10 mcg/min, potassium chloride 10 mEq IV x2 today, sodium bicarbonate 150 mEq in sterile water at 125 mL/hr, vasopressin gtt at 0.03 units/min.  Labs reviewed: CBG 138-162, BUN 37, Creatinine 2.78.  I/O: 528 mL UOP Yesterday (0.2 mL/kg/hr)  NUTRITION - FOCUSED PHYSICAL EXAM:  Unable to complete at this  time.  Diet Order:   Diet Order    None     EDUCATION NEEDS:   No education needs have been identified at this time  Skin:  Skin Assessment: Reviewed RN Assessment  Last BM:  Unknown  Height:   Ht Readings from Last 1 Encounters:  04/12/19 6\' 4"  (1.93 m)   Weight:   Wt Readings from Last 1 Encounters:  04/14/19 110.4 kg   Ideal Body Weight:  91.8 kg  BMI:  Body mass index is 29.63 kg/m.  Estimated Nutritional Needs:   Kcal:  2009 (PSU 2003b w/ MSJ 1867, Ve 8.6, Tmax 36.9)  Protein:  150-172 grams on CRRT  Fluid:  per MD  Jacklynn Barnacle, MS, RD, LDN Office: [phone] Pager: [phone] After Hours/Weekend Pager: [phone]

## 2019-04-14 NOTE — Progress Notes (Signed)
[facility]  ROUNDING NOTE   Subjective:  Patient seen at bedside. Remains critically ill at the moment. Tolerating CRRT well at the moment. Hyperkalemia much improved as potassium currently 3.6. Azotemia significantly improved as well.   Objective:  Vital signs in last 24 hours:  Temp:  [95.4 F (35.2 C)-98.4 F (36.9 C)] 98.1 F (36.7 C) (11/08 1200) Pulse Rate:  [69-160] 72 (11/08 1200) Resp:  [9-31] 15 (11/08 1200) BP: (78-152)/(40-64) 117/53 (11/08 1200) SpO2:  [100 %] 100 % (11/08 1200) FiO2 (%):  [30 %-40 %] 30 % (11/08 1120) Weight:  [107.5 kg-110.4 kg] 110.4 kg (11/08 0500)  Weight change: -19.5 kg Filed Weights   04/12/19 1148 04/13/19 1412 04/14/19 0500  Weight: 127 kg 107.5 kg 110.4 kg    Intake/Output: I/O last 3 completed shifts: In: 8027.8 [I.V.:6027.7; IV Piggyback:2000.2] Out: 3085.2 [Urine:528; Emesis/NG output:100; Other:2457.2]   Intake/Output this shift:  Total I/O In: 1020 [I.V.:845.2; NG/GT:25; IV Piggyback:149.8] Out: 915.2 [Urine:145; Other:770.2]  Physical Exam: General: Critically ill-appearing  Head: Endotracheal tube in place  Eyes: Anicteric  Neck: Supple, trachea midline  Lungs:  Bilateral rhonchi, vent assisted  Heart: S1S2 no rubs  Abdomen:  Soft, nontender, bowel sounds present  Extremities: Trace peripheral edema.  Neurologic: Intubated, not following commands  Skin: No lesions  Access:  Right femoral temporary hemodialysis catheter    Basic Metabolic Panel: Recent Labs  Lab 04/13/19 1835 04/13/19 2245 04/14/19 0134 04/14/19 0525 04/14/19 1058  NA 135 140 137 138 137  K 5.6*  5.6* 4.1 5.8* 3.2* 3.6  CL 98 102 102 100 99  CO2 8* 15* <7* 20* 24  GLUCOSE 275* 287* 288* 196* 156*  BUN 62* 57* 64* 54* 37*  CREATININE 6.06* 5.04* 5.95* 4.13* 2.78*  CALCIUM 7.2* 7.0* 7.2* 7.0* 7.0*  MG  --   --  1.8 1.8  --   PHOS 7.8* 6.5* 8.4* 5.0* 3.5    Liver Function Tests: Recent Labs  Lab 04/12/19 1154  04/13/19 0355  04/13/19 1323  04/13/19 1835 04/13/19 2245 04/14/19 0134 04/14/19 0525 04/14/19 1058  AST 87* 89*  --  87*  --   --   --   --  159*  --   ALT 48* 56*  --  68*  --   --   --   --  108*  --   ALKPHOS 68 60  --  59  --   --   --   --  54  --   BILITOT 0.7 0.7  --  1.0  --   --   --   --  0.7  --   PROT 7.1 6.1*  --  6.0*  --   --   --   --  5.5*  --   ALBUMIN 3.1* 2.6*   < > 2.7*   < > 2.6* 2.7* 2.6* 2.7*  2.8* 2.7*   < > = values in this interval not displayed.   No results for input(s): LIPASE, AMYLASE in the last 168 hours. Recent Labs  Lab 04/13/19 1046  AMMONIA 269*    CBC: Recent Labs  Lab 04/12/19 1154 04/13/19 0355 04/13/19 1837 04/13/19 2245 04/14/19 0134  WBC 9.0 8.4  --  13.4* 13.6*  NEUTROABS 7.0 6.7  --   --  10.9*  HGB 9.9* 9.3* 7.9* 7.7* 7.8*  HCT 31.0* 30.6*  --  24.2* 24.1*  MCV 86.8 91.3  --  88.0 86.7  PLT 259 316  --  248 234    Cardiac Enzymes: No results for input(s): CKTOTAL, CKMB, CKMBINDEX, TROPONINI in the last 168 hours.  BNP: Invalid input(s): POCBNP  CBG: Recent Labs  Lab 04/13/19 2108 04/13/19 2348 04/14/19 0354 04/14/19 0732 04/14/19 1139  GLUCAP 304* 264* 198* 162* 138*    Microbiology: Results for orders placed or performed during the hospital encounter of 04/12/19  Blood culture (routine x 2)     Status: None (Preliminary result)   Collection Time: 04/12/19 11:58 AM   Specimen: BLOOD  Result Value Ref Range Status   Specimen Description BLOOD RIGHT ANTECUBITAL  Final   Special Requests   Final    BOTTLES DRAWN AEROBIC AND ANAEROBIC Blood Culture adequate volume   Culture   Final    NO GROWTH 2 DAYS Performed at [facility], [address] [place], [place] [address]    Report Status PENDING  Incomplete  SARS CORONAVIRUS 2 (TAT 6-24 HRS) Nasopharyngeal Nasopharyngeal Swab     Status: None   Collection Time: 04/12/19 11:58 AM   Specimen: Nasopharyngeal Swab  Result Value Ref Range Status    SARS Coronavirus 2 NEGATIVE NEGATIVE Final    Comment: (NOTE) SARS-CoV-2 target nucleic acids are NOT DETECTED. The SARS-CoV-2 RNA is generally detectable in upper and lower respiratory specimens during the acute phase of infection. Negative results do not preclude SARS-CoV-2 infection, do not rule out co-infections with other pathogens, and should not be used as the sole basis for treatment or other patient management decisions. Negative results must be combined with clinical observations, patient history, and epidemiological information. The expected result is Negative. Fact Sheet for Patients: SugarRoll.be Fact Sheet for Healthcare Providers: https://www.woods-mathews.com/ This test is not yet approved or cleared by the [place] FDA and  has been authorized for detection and/or diagnosis of SARS-CoV-2 by FDA under an Emergency Use Authorization (EUA). This EUA will remain  in effect (meaning this test can be used) for the duration of the COVID-19 declaration under Section 56 4(b)(1) of the Act, 21 U.S.C. section 360bbb-3(b)(1), unless the authorization is terminated or revoked sooner. Performed at [facility], [place] [address] [place], [place] [address]   Urine Culture     Status: Abnormal   Collection Time: 04/12/19  2:18 PM   Specimen: Urine, Catheterized  Result Value Ref Range Status   Specimen Description   Final    URINE, CATHETERIZED Performed at [facility], [address] [place], [place] [address]    Special Requests   Final    NONE Performed at [facility], [address] [place], [place] [address]    Culture >=100,000 COLONIES/mL ESCHERICHIA COLI (A)  Final   Report Status 04/14/2019 FINAL  Final   Organism ID, Bacteria ESCHERICHIA COLI (A)  Final      Susceptibility   Escherichia coli - MIC*    AMPICILLIN >=32 RESISTANT Resistant     CEFAZOLIN <=4 SENSITIVE Sensitive      CEFTRIAXONE <=1 SENSITIVE Sensitive     CIPROFLOXACIN <=0.25 SENSITIVE Sensitive     GENTAMICIN <=1 SENSITIVE Sensitive     IMIPENEM <=0.25 SENSITIVE Sensitive     NITROFURANTOIN <=16 SENSITIVE Sensitive     TRIMETH/SULFA <=20 SENSITIVE Sensitive     AMPICILLIN/SULBACTAM >=32 RESISTANT Resistant     PIP/TAZO <=4 SENSITIVE Sensitive     Extended ESBL NEGATIVE Sensitive     * >=100,000 COLONIES/mL ESCHERICHIA COLI  MRSA PCR Screening     Status: None  Collection Time: 04/13/19  5:59 AM   Specimen: Nasal Mucosa; Nasopharyngeal  Result Value Ref Range Status   MRSA by PCR NEGATIVE NEGATIVE Final    Comment:        The GeneXpert MRSA Assay (FDA approved for NASAL specimens only), is one component of a comprehensive MRSA colonization surveillance program. It is not intended to diagnose MRSA infection nor to guide or monitor treatment for MRSA infections. Performed at [facility], [address] [place], [place] [address]     Coagulation Studies: No results for input(s): LABPROT, INR in the last 72 hours.  Urinalysis: Recent Labs    04/12/19 1418  COLORURINE YELLOW*  LABSPEC 1.011  PHURINE 6.0  GLUCOSEU NEGATIVE  HGBUR MODERATE*  BILIRUBINUR NEGATIVE  KETONESUR NEGATIVE  PROTEINUR 100*  NITRITE NEGATIVE  LEUKOCYTESUR MODERATE*      Imaging: Ct Abdomen Pelvis Wo Contrast  Result Date: 04/13/2019 CLINICAL DATA:  Sepsis search, vomiting, diarrhea, cough EXAM: CT ABDOMEN AND PELVIS WITHOUT CONTRAST TECHNIQUE: Multidetector CT imaging of the abdomen and pelvis was performed following the standard protocol without IV contrast. COMPARISON:  01/18/2016 FINDINGS: Examination is generally limited by breath motion artifact throughout. Lower chest: Trace right pleural effusion. Three-vessel coronary artery calcifications. Hepatobiliary: No solid liver abnormality is seen. No gallstones, gallbladder wall thickening, or biliary dilatation. Pancreas: Unremarkable. No  pancreatic ductal dilatation or surrounding inflammatory changes. Spleen: Normal in size without significant abnormality. Adrenals/Urinary Tract: Adrenal glands are unremarkable. Multiple small bilateral nonobstructive renal calculi. Urinary bladder is decompressed by a Foley catheter although abnormally thickened. Stomach/Bowel: Stomach is within normal limits. Appendix is not clearly visualized. No evidence of bowel wall thickening, distention, or inflammatory changes. Vascular/Lymphatic: Aortic atherosclerosis. Right femoral vascular catheter, tip within the external iliac vein. No enlarged abdominal or pelvic lymph nodes. Reproductive: No mass or other significant abnormality. Other: No abdominal wall hernia or abnormality. No abdominopelvic ascites. Musculoskeletal: No acute or significant osseous findings. IMPRESSION: 1. Examination of the abdomen and pelvis is generally limited by breath motion artifact throughout as well as lack of intravenous contrast. 2. Urinary bladder is decompressed by a Foley catheter although abnormally thickened. This may be due to chronic outlet obstruction although correlate for urinalysis evidence of infectious or inflammatory cystitis. 3. No other CT findings of the abdomen or pelvis to suggest source of sepsis. 4. Bilateral nonobstructive nephrolithiasis. No ureteral calculi or hydronephrosis. 5.  Trace, nonspecific right pleural effusion. 6.  Coronary artery disease.  Aortic Atherosclerosis (ICD10-I70.0). Electronically Signed   By: Eddie Candle M.D.   On: 04/13/2019 13:46   Dg Chest 1 View  Result Date: 04/13/2019 CLINICAL DATA:  Initial evaluation for acute shortness of breath. EXAM: CHEST  1 VIEW COMPARISON:  Prior radiograph from 04/05/2019. FINDINGS: Mild cardiomegaly, stable. Mediastinal silhouette within normal limits. Lungs are hypoinflated. Secondary diffuse bronchovascular crowding. No focal infiltrates. No pulmonary edema or pleural effusion. No visible  pneumothorax, although the patient's head partially obscures the upper lungs. No acute osseous finding. IMPRESSION: 1. Shallow lung inflation with secondary diffuse bronchovascular crowding. 2. No other active cardiopulmonary disease. Electronically Signed   By: Jeannine Boga M.D.   On: 04/13/2019 04:25   Dg Abd 1 View  Result Date: 04/13/2019 CLINICAL DATA:  OG tube placed EXAM: ABDOMEN - 1 VIEW COMPARISON:  April 12, 2019 FINDINGS: The enteric tube projects over the gastric body. The bowel gas pattern is nonobstructive. IMPRESSION: Enteric tube projects over the gastric body. Electronically Signed   By: Harrell Gave  Green M.D.   On: 04/13/2019 19:06   Ct Head Wo Contrast  Result Date: 04/13/2019 CLINICAL DATA:  Initial evaluation for acute altered mental status. EXAM: CT HEAD WITHOUT CONTRAST TECHNIQUE: Contiguous axial images were obtained from the base of the skull through the vertex without intravenous contrast. COMPARISON:  Prior head CT from 04/05/2019. FINDINGS: Brain: Atrophy with mild chronic small vessel ischemic disease again noted, stable. No acute intracranial hemorrhage. No acute large vessel territory infarct. No mass lesion, midline shift or mass effect. No hydrocephalus. No extra-axial fluid collection. Vascular: No hyperdense vessel. Calcified atherosclerosis at the skull base. Skull: Scalp soft tissues and calvarium within normal limits. Sinuses/Orbits: Globes and orbital soft tissues demonstrate no acute finding. Mild chronic mucoperiosteal thickening noted within the ethmoidal air cells. Paranasal sinuses are otherwise clear. No mastoid effusion. Other: None. IMPRESSION: 1. No acute intracranial abnormality. 2. Stable atrophy with mild chronic small vessel ischemic disease. Electronically Signed   By: Jeannine Boga M.D.   On: 04/13/2019 04:23   US Renal  Result Date: 04/12/2019 CLINICAL DATA:  Acute renal failure EXAM: RENAL / URINARY TRACT ULTRASOUND COMPLETE  COMPARISON:  CT abdomen pelvis 01/18/2016 FINDINGS: Right Kidney: Renal measurements: 10.7 x 5.7 x 5.7 cm = volume: 179.4 mL. Slightly increased renal cortical echogenicity and renal sinus lipomatosis. No shadowing calculus, hydronephrosis or concerning renal mass. Left Kidney: Renal measurements: 10.6 x 6.1 x 5.3 cm = volume: 180 mL. More normal cortical echogenicity. Shadowing calculus is seen in the lower pole left kidney measuring 5 mm. No hydronephrosis or concerning renal mass. Bladder: Decompressed by Foley catheter and therefore poorly evaluated by sonography. Other: None. IMPRESSION: Nonobstructing left nephrolithiasis. Slightly increased right renal echogenicity, may reflect medical renal disease. Bladder decompressed by Foley catheter, poorly evaluated by sonography. Electronically Signed   By: Lovena Le M.D.   On: 04/12/2019 20:03   Dg Chest Port 1 View  Result Date: 04/14/2019 CLINICAL DATA:  Acute respiratory failure. EXAM: PORTABLE CHEST 1 VIEW COMPARISON:  04/13/2019 FINDINGS: Endotracheal tube has tip 4.1 cm above the carina. Enteric tube courses into the stomach and off the film as tip is not visualized. Lungs are adequately inflated and otherwise clear. Cardiomediastinal silhouette and remainder of the exam is unchanged. IMPRESSION: No acute cardiopulmonary disease. Tubes and lines as described. Electronically Signed   By: Marin Olp M.D.   On: 04/14/2019 09:00   Dg Chest Port 1 View  Result Date: 04/13/2019 CLINICAL DATA:  ET tube placement EXAM: PORTABLE CHEST 1 VIEW COMPARISON:  Chest radiograph 04/13/2019 FINDINGS: Neural placement of an endotracheal tube with tip between the thoracic inlet and carina. Stable cardiomediastinal contours. There are scattered linear opacities likely reflecting atelectasis. No new focal infiltrate. No pneumothorax or large pleural effusion. IMPRESSION: The endotracheal tube tip projects between the thoracic inlet and carina. No new acute finding.  Electronically Signed   By: Audie Pinto M.D.   On: 04/13/2019 18:43   Dg Abdomen Acute W/chest  Result Date: 04/12/2019 CLINICAL DATA:  Vomiting, diarrhea. EXAM: DG ABDOMEN ACUTE W/ 1V CHEST COMPARISON:  01/20/2016 FINDINGS: Lungs are clear. Heart size is normal. No free air beneath either right or left hemidiaphragm. Stool and gas throughout the colon. No signs of bowel obstruction. No acute bone process. IMPRESSION: No acute cardiopulmonary disease. No acute intra-abdominal finding. Electronically Signed   By: Zetta Bills M.D.   On: 04/12/2019 12:49     Medications:   .  prismasol BGK 4/2.5 1,500 mL/hr at 04/14/19  [place] 4/2.5 500 mL/hr at 04/14/19 0733  . cefTRIAXone (ROCEPHIN)  IV Stopped (04/13/19 1518)  . famotidine (PEPCID) IV Stopped (04/14/19 1117)  . fentaNYL infusion INTRAVENOUS 300 mcg/hr (04/14/19 1200)  . metronidazole Stopped (04/14/19 1149)  . norepinephrine (LEVOPHED) Adult infusion 5 mcg/min (04/14/19 1200)  . phenylephrine (NEO-SYNEPHRINE) Adult infusion Stopped (04/13/19 2255)  . potassium chloride 10 mEq (04/14/19 1216)  . prismasol BGK 4/2.5 2,000 mL/hr at 04/14/19 1152  .  sodium bicarbonate (isotonic) infusion in sterile water 125 mL/hr at 04/14/19 1200  . vasopressin (PITRESSIN) infusion - *FOR SHOCK* 0.03 Units/min (04/14/19 1200)   . chlorhexidine gluconate (MEDLINE KIT)  15 mL Mouth Rinse BID  . Chlorhexidine Gluconate Cloth  6 each Topical Daily  . clopidogrel  75 mg Oral Daily  . heparin injection (subcutaneous)  5,000 Units Subcutaneous Q8H  . insulin aspart  0-15 Units Subcutaneous Q4H  . mouth rinse  15 mL Mouth Rinse 10 times per day  . pantoprazole (PROTONIX) IV  40 mg Intravenous Q24H   acetaminophen **OR** acetaminophen, heparin, ipratropium-albuterol, midazolam  Assessment/ Plan:  85 y.o. male with a PMHx of congestive heart failure, coronary artery disease, diabetes mellitus type 2, hyperlipidemia, hypertension,  myocardial infarction, stroke, who was admitted to [facility] on 04/12/2019 for nausea, diarrhea, low grade fever.   1.  Acute kidney injury, suspect acute tubular necrosis. 2.  Severe hyperkalemia. 3.  Metabolic acidosis. 4.  Acute respiratory failure. 5.  Hypotension.  Plan: The patient's renal parameters have significantly improved with CRRT.  Hyperkalemia much improved as potassium 3.6.  Patient has been switched over to a 4K bath.  Overall prognosis remains quite guarded.  Continue CRRT at this time.  Patient now on mechanical ventilation as well.  Continue to monitor serum electrolytes.  We will keep the patient net even for now.   LOS: 2 Philip Garrett 11/8/202012:18 PM

## 2019-04-14 NOTE — Progress Notes (Signed)
Per Dr. Holley Raring, [place] to place mid-line in order to draw labs while pt is on CRRT.

## 2019-04-14 NOTE — Progress Notes (Signed)
CRRT stopped and pt rinsed back at [place].

## 2019-04-14 NOTE — Progress Notes (Signed)
CRITICAL CARE PROGRESS NOTE    Name: BRIGHT SPIELMANN MRN: [id number] DOB: 06-Feb-1932     LOS: 2   SUBJECTIVE FINDINGS & SIGNIFICANT EVENTS   Patient description:  Brief patient description: 85 year old African-American male admitted from [facility] with nausea and vomiting x1 week, diarrhea, acute change in mental status, generalized weakness, hypotension and fever.  Found to be in acute renal failure, urosepsis and septic shock.  Patient decompensated this morning requiring emergency respiratory and blood pressure support.  Now in refractory shock with severe acidosis.  Emergently transferred to the ICU.  [place] discussion with POA Mariann Laster , patient is DNR but she wants MV for few days to allow chance of recovery if unsuccessful to proceed with compassionate weaning to DNR/CC   Lines / Drains: Trialysis femoral vein PIV x2  Cultures / Sepsis markers: Blood cx - negative to date Urine culture with E. coli over 100,000 colonies   Antibiotics:  Rocephin and Flagyl IV  Protocols / Consultants: Nephrology-currently on CRRT    PAST MEDICAL HISTORY   Past Medical History:  Diagnosis Date   Cervical stenosis of spinal canal    CHF (congestive heart failure) ([place])    Coronary artery disease    Diabetes mellitus without complication ([place])    Hyperlipidemia    Hypertension    Myocardial infarction ([place])    Stroke ([place])    TIA     SURGICAL HISTORY   Past Surgical History:  Procedure Laterality Date   AMPUTATION TOE Left 10/30/2015   Procedure: AMPUTATION LEFT SECOND TOE/mpj left 2nd;  Surgeon: Sharlotte Alamo, DPM;  Location: [facility] ORS;  Service: Podiatry;  Laterality: Left;   CORONARY ANGIOPLASTY     TOE AMPUTATION Left    great toe     FAMILY HISTORY   Family History  Problem Relation  Age of Onset   Diabetes Other      SOCIAL HISTORY   Social History   Tobacco Use   Smoking status: Never Smoker   Smokeless tobacco: Current User    Types: Snuff, Chew  Substance Use Topics   Alcohol use: No   Drug use: No     MEDICATIONS   Current Medication:  Current Facility-Administered Medications:     prismasol BGK 4/2.5 infusion, , CRRT, Continuous, Lateef, Munsoor, MD, Last Rate: 1,500 mL/hr at 04/14/19 0732    prismasol BGK 4/2.5 infusion, , CRRT, Continuous, Lateef, Munsoor, MD, Last Rate: 500 mL/hr at 04/14/19 0733   acetaminophen (TYLENOL) tablet 650 mg, 650 mg, Oral, Q6H PRN **OR** acetaminophen (TYLENOL) suppository 650 mg, 650 mg, Rectal, Q6H PRN, Danford, Suann Larry, MD   amiodarone (NEXTERONE) 150-4.21 MG/100ML-% bolus, , , , , Stopped at 04/14/19 0020   cefTRIAXone (ROCEPHIN) 1 g in sodium chloride 0.9 % 100 mL IVPB, 1 g, Intravenous, Q24H, Danford, Suann Larry, MD, Stopped at 04/13/19 1518   chlorhexidine gluconate (MEDLINE KIT) (PERIDEX) 0.12 % solution 15 mL, 15 mL, Mouth Rinse, BID, Tukov-Yual, Magdalene S, NP, 15 mL at 04/14/19 0841   Chlorhexidine Gluconate Cloth 2 % PADS 6 each, 6 each, Topical, Daily, Bianka Liberati, MD   clopidogrel (PLAVIX) tablet 75 mg, 75 mg, Oral, Daily, Danford, Suann Larry, MD   diltiazem (CARDIZEM) 25 MG/5ML injection, , , , , Stopped at 04/13/19 2320   famotidine (PEPCID) IVPB 20 mg premix, 20 mg, Intravenous, Q24H, Rubie Ficco, MD   fentaNYL 2518mg in NS 2533m(1022mml) infusion-PREMIX, 0-400 mcg/hr, Intravenous, Continuous, AleLanney Ginsuad, MD, Last Rate: 15 mL/hr at  04/14/19 1000, 150 mcg/hr at 04/14/19 1000   heparin injection 1,000-6,000 Units, 1,000-6,000 Units, CRRT, PRN, Lateef, Munsoor, MD, 2,800 Units at 04/13/19 1445   heparin injection 5,000 Units, 5,000 Units, Subcutaneous, Q8H, Danford, Suann Larry, MD, 5,000 Units at 04/14/19 0537   [COMPLETED] hydrocortisone sodium succinate  (SOLU-CORTEF) 100 MG injection 100 mg, 100 mg, Intravenous, Once, 100 mg at 04/13/19 1054 **FOLLOWED BY** hydrocortisone sodium succinate (SOLU-CORTEF) 100 MG injection 50 mg, 50 mg, Intravenous, Q6H, Tukov-Yual, Magdalene S, NP, 50 mg at 04/14/19 0424   insulin aspart (novoLOG) injection 0-15 Units, 0-15 Units, Subcutaneous, Q4H, Tukov-Yual, Magdalene S, NP, 3 Units at 04/14/19 0840   ipratropium-albuterol (DUONEB) 0.5-2.5 (3) MG/3ML nebulizer solution 3 mL, 3 mL, Nebulization, Q6H PRN, Tukov-Yual, Magdalene S, NP   MEDLINE mouth rinse, 15 mL, Mouth Rinse, 10 times per day, Tukov-Yual, Magdalene S, NP, 15 mL at 04/14/19 0537   metroNIDAZOLE (FLAGYL) IVPB 500 mg, 500 mg, Intravenous, Q8H, Tukov-Yual, Magdalene S, NP, Stopped at 04/14/19 0251   midazolam (VERSED) injection 2-4 mg, 2-4 mg, Intravenous, Q2H PRN, Tukov-Yual, Magdalene S, NP, 4 mg at 04/14/19 0429   norepinephrine (LEVOPHED) 16 mg in 246m premix infusion, 0-65 mcg/min, Intravenous, Titrated, Tukov-Yual, Magdalene S, NP, Last Rate: 5.63 mL/hr at 04/14/19 1000, 6 mcg/min at 04/14/19 1000   phenylephrine (NEO-SYNEPHRINE) 40 mg in sodium chloride 0.9 % 250 mL (0.16 mg/mL) infusion, 0-400 mcg/min, Intravenous, Titrated, Tukov-Yual, Magdalene S, NP, Stopped at 04/13/19 2255   prismasol BGK 4/2.5 infusion, , CRRT, Continuous, Lateef, Munsoor, MD, Last Rate: 2,000 mL/hr at 04/14/19 07829  sodium bicarbonate 150 mEq in sterile water 1,000 mL infusion, , Intravenous, Continuous, Tukov-Yual, Magdalene S, NP, Last Rate: 125 mL/hr at 04/14/19 1000   vasopressin (PITRESSIN) 40 Units in sodium chloride 0.9 % 250 mL (0.16 Units/mL) infusion, 0.03 Units/min, Intravenous, Continuous, Tukov-Yual, Magdalene S, NP, Last Rate: 11.25 mL/hr at 04/14/19 1000, 0.03 Units/min at 04/14/19 1000    ALLERGIES   Patient has no known allergies.    REVIEW OF SYSTEMS     Unable to obtain well on sedation with mechanical ventilation  PHYSICAL  EXAMINATION   Vital Signs: Temp:  [95 F (35 C)-98.2 F (36.8 C)] 98.2 F (36.8 C) (11/08 0900) Pulse Rate:  [69-160] 77 (11/08 0900) Resp:  [9-31] 16 (11/08 0900) BP: (78-152)/(40-64) 114/56 (11/08 0900) SpO2:  [100 %] 100 % (11/08 0900) FiO2 (%):  [30 %-40 %] 30 % (11/08 0742) Weight:  [107.5 kg-110.4 kg] 110.4 kg (11/08 0500)  GENERAL: With advanced age, no apparent distress sedated on mechanical ventilation HEAD: Normocephalic, atraumatic.  EYES: Pupils equal, round, reactive to light.  No scleral icterus.  MOUTH: Moist mucosal membrane. NECK: Supple. No thyromegaly. No nodules. No JVD.  PULMONARY: Bibasilar crepitations without wheezing CARDIOVASCULAR: S1 and S2. Regular rate and rhythm. No murmurs, rubs, or gallops.  GASTROINTESTINAL: Soft, nontender, non-distended. No masses. Positive bowel sounds. No hepatosplenomegaly.  MUSCULOSKELETAL: No swelling, clubbing, or edema.  NEUROLOGIC: Sedated on mechanical ventilation SKIN:intact,warm,dry   PERTINENT DATA     Infusions:   prismasol BGK 4/2.5 1,500 mL/hr at 04/14/19 0732    prismasol BGK 4/2.5 500 mL/hr at 04/14/19 0733   amiodarone Stopped (04/14/19 0020)   cefTRIAXone (ROCEPHIN)  IV Stopped (04/13/19 1518)   famotidine (PEPCID) IV     fentaNYL infusion INTRAVENOUS 150 mcg/hr (04/14/19 1000)   metronidazole Stopped (04/14/19 0251)   norepinephrine (LEVOPHED) Adult infusion 6 mcg/min (04/14/19 1000)   phenylephrine (NEO-SYNEPHRINE) Adult infusion Stopped (04/13/19  2255)   prismasol BGK 4/2.5 2,000 mL/hr at 04/14/19 9357    sodium bicarbonate (isotonic) infusion in sterile water 125 mL/hr at 04/14/19 1000   vasopressin (PITRESSIN) infusion - *FOR SHOCK* 0.03 Units/min (04/14/19 1000)   Scheduled Medications:  chlorhexidine gluconate (MEDLINE KIT)  15 mL Mouth Rinse BID   Chlorhexidine Gluconate Cloth  6 each Topical Daily   clopidogrel  75 mg Oral Daily   diltiazem       heparin injection  (subcutaneous)  5,000 Units Subcutaneous Q8H   hydrocortisone sod succinate (SOLU-CORTEF) inj  50 mg Intravenous Q6H   insulin aspart  0-15 Units Subcutaneous Q4H   mouth rinse  15 mL Mouth Rinse 10 times per day   PRN Medications: acetaminophen **OR** acetaminophen, heparin, ipratropium-albuterol, midazolam Hemodynamic parameters:   Intake/Output: 11/07 0701 - 11/08 0700 In: 5027.8 [I.V.:4027.7; IV Piggyback:1000.2] Out: 3085.2 [Urine:528; Emesis/NG output:100]  Ventilator  Settings: Vent Mode: PRVC FiO2 (%):  [30 %-40 %] 30 % Set Rate:  [15 bmp] 15 bmp Vt Set:  [500 mL] 500 mL PEEP:  [5 cmH20] 5 cmH20 Plateau Pressure:  [11 cmH20-15 cmH20] 11 cmH20     LAB RESULTS:  Basic Metabolic Panel: Recent Labs  Lab 04/13/19 1512 04/13/19 1835 04/13/19 2245 04/14/19 0134 04/14/19 0525  NA 134* 135 140 137 138  K 6.6* 5.6*   5.6* 4.1 5.8* 3.2*  CL 98 98 102 102 100  CO2 <7* 8* 15* <7* 20*  GLUCOSE 307* 275* 287* 288* 196*  BUN 68* 62* 57* 64* 54*  CREATININE 6.83* 6.06* 5.04* 5.95* 4.13*  CALCIUM 7.3* 7.2* 7.0* 7.2* 7.0*  MG  --   --   --  1.8 1.8  PHOS 8.8* 7.8* 6.5* 8.4* 5.0*   Liver Function Tests: Recent Labs  Lab 04/12/19 1154 04/13/19 0355  04/13/19 1323 04/13/19 1512 04/13/19 1835 04/13/19 2245 04/14/19 0134 04/14/19 0525  AST 87* 89*  --  87*  --   --   --   --  159*  ALT 48* 56*  --  68*  --   --   --   --  108*  ALKPHOS 68 60  --  59  --   --   --   --  54  BILITOT 0.7 0.7  --  1.0  --   --   --   --  0.7  PROT 7.1 6.1*  --  6.0*  --   --   --   --  5.5*  ALBUMIN 3.1* 2.6*   < > 2.7* 2.6* 2.6* 2.7* 2.6* 2.7*   2.8*   < > = values in this interval not displayed.   No results for input(s): LIPASE, AMYLASE in the last 168 hours. Recent Labs  Lab 04/13/19 1046  AMMONIA 269*   CBC: Recent Labs  Lab 04/12/19 1154 04/13/19 0355 04/13/19 1837 04/13/19 2245 04/14/19 0134  WBC 9.0 8.4  --  13.4* 13.6*  NEUTROABS 7.0 6.7  --   --  10.9*  HGB  9.9* 9.3* 7.9* 7.7* 7.8*  HCT 31.0* 30.6*  --  24.2* 24.1*  MCV 86.8 91.3  --  88.0 86.7  PLT 259 316  --  248 234   Cardiac Enzymes: No results for input(s): CKTOTAL, CKMB, CKMBINDEX, TROPONINI in the last 168 hours. BNP: Invalid input(s): POCBNP CBG: Recent Labs  Lab 04/13/19 0749 04/13/19 2108 04/13/19 2348 04/14/19 0354 04/14/19 0732  GLUCAP 284* 304* 264* 198* 162*     IMAGING  RESULTS:  Imaging: Ct Abdomen Pelvis Wo Contrast  Result Date: 04/13/2019 CLINICAL DATA:  Sepsis search, vomiting, diarrhea, cough EXAM: CT ABDOMEN AND PELVIS WITHOUT CONTRAST TECHNIQUE: Multidetector CT imaging of the abdomen and pelvis was performed following the standard protocol without IV contrast. COMPARISON:  01/18/2016 FINDINGS: Examination is generally limited by breath motion artifact throughout. Lower chest: Trace right pleural effusion. Three-vessel coronary artery calcifications. Hepatobiliary: No solid liver abnormality is seen. No gallstones, gallbladder wall thickening, or biliary dilatation. Pancreas: Unremarkable. No pancreatic ductal dilatation or surrounding inflammatory changes. Spleen: Normal in size without significant abnormality. Adrenals/Urinary Tract: Adrenal glands are unremarkable. Multiple small bilateral nonobstructive renal calculi. Urinary bladder is decompressed by a Foley catheter although abnormally thickened. Stomach/Bowel: Stomach is within normal limits. Appendix is not clearly visualized. No evidence of bowel wall thickening, distention, or inflammatory changes. Vascular/Lymphatic: Aortic atherosclerosis. Right femoral vascular catheter, tip within the external iliac vein. No enlarged abdominal or pelvic lymph nodes. Reproductive: No mass or other significant abnormality. Other: No abdominal wall hernia or abnormality. No abdominopelvic ascites. Musculoskeletal: No acute or significant osseous findings. IMPRESSION: 1. Examination of the abdomen and pelvis is generally  limited by breath motion artifact throughout as well as lack of intravenous contrast. 2. Urinary bladder is decompressed by a Foley catheter although abnormally thickened. This may be due to chronic outlet obstruction although correlate for urinalysis evidence of infectious or inflammatory cystitis. 3. No other CT findings of the abdomen or pelvis to suggest source of sepsis. 4. Bilateral nonobstructive nephrolithiasis. No ureteral calculi or hydronephrosis. 5.  Trace, nonspecific right pleural effusion. 6.  Coronary artery disease.  Aortic Atherosclerosis (ICD10-I70.0). Electronically Signed   By: Eddie Candle M.D.   On: 04/13/2019 13:46   Dg Chest 1 View  Result Date: 04/13/2019 CLINICAL DATA:  Initial evaluation for acute shortness of breath. EXAM: CHEST  1 VIEW COMPARISON:  Prior radiograph from 04/05/2019. FINDINGS: Mild cardiomegaly, stable. Mediastinal silhouette within normal limits. Lungs are hypoinflated. Secondary diffuse bronchovascular crowding. No focal infiltrates. No pulmonary edema or pleural effusion. No visible pneumothorax, although the patient's head partially obscures the upper lungs. No acute osseous finding. IMPRESSION: 1. Shallow lung inflation with secondary diffuse bronchovascular crowding. 2. No other active cardiopulmonary disease. Electronically Signed   By: Jeannine Boga M.D.   On: 04/13/2019 04:25   Dg Abd 1 View  Result Date: 04/13/2019 CLINICAL DATA:  OG tube placed EXAM: ABDOMEN - 1 VIEW COMPARISON:  April 12, 2019 FINDINGS: The enteric tube projects over the gastric body. The bowel gas pattern is nonobstructive. IMPRESSION: Enteric tube projects over the gastric body. Electronically Signed   By: Constance Holster M.D.   On: 04/13/2019 19:06   Ct Head Wo Contrast  Result Date: 04/13/2019 CLINICAL DATA:  Initial evaluation for acute altered mental status. EXAM: CT HEAD WITHOUT CONTRAST TECHNIQUE: Contiguous axial images were obtained from the base of the skull  through the vertex without intravenous contrast. COMPARISON:  Prior head CT from 04/05/2019. FINDINGS: Brain: Atrophy with mild chronic small vessel ischemic disease again noted, stable. No acute intracranial hemorrhage. No acute large vessel territory infarct. No mass lesion, midline shift or mass effect. No hydrocephalus. No extra-axial fluid collection. Vascular: No hyperdense vessel. Calcified atherosclerosis at the skull base. Skull: Scalp soft tissues and calvarium within normal limits. Sinuses/Orbits: Globes and orbital soft tissues demonstrate no acute finding. Mild chronic mucoperiosteal thickening noted within the ethmoidal air cells. Paranasal sinuses are otherwise clear. No mastoid effusion. Other: None. IMPRESSION: 1.  No acute intracranial abnormality. 2. Stable atrophy with mild chronic small vessel ischemic disease. Electronically Signed   By: Jeannine Boga M.D.   On: 04/13/2019 04:23   US Renal  Result Date: 04/12/2019 CLINICAL DATA:  Acute renal failure EXAM: RENAL / URINARY TRACT ULTRASOUND COMPLETE COMPARISON:  CT abdomen pelvis 01/18/2016 FINDINGS: Right Kidney: Renal measurements: 10.7 x 5.7 x 5.7 cm = volume: 179.4 mL. Slightly increased renal cortical echogenicity and renal sinus lipomatosis. No shadowing calculus, hydronephrosis or concerning renal mass. Left Kidney: Renal measurements: 10.6 x 6.1 x 5.3 cm = volume: 180 mL. More normal cortical echogenicity. Shadowing calculus is seen in the lower pole left kidney measuring 5 mm. No hydronephrosis or concerning renal mass. Bladder: Decompressed by Foley catheter and therefore poorly evaluated by sonography. Other: None. IMPRESSION: Nonobstructing left nephrolithiasis. Slightly increased right renal echogenicity, may reflect medical renal disease. Bladder decompressed by Foley catheter, poorly evaluated by sonography. Electronically Signed   By: Lovena Le M.D.   On: 04/12/2019 20:03   Dg Chest Port 1 View  Result Date:  04/14/2019 CLINICAL DATA:  Acute respiratory failure. EXAM: PORTABLE CHEST 1 VIEW COMPARISON:  04/13/2019 FINDINGS: Endotracheal tube has tip 4.1 cm above the carina. Enteric tube courses into the stomach and off the film as tip is not visualized. Lungs are adequately inflated and otherwise clear. Cardiomediastinal silhouette and remainder of the exam is unchanged. IMPRESSION: No acute cardiopulmonary disease. Tubes and lines as described. Electronically Signed   By: Marin Olp M.D.   On: 04/14/2019 09:00   Dg Chest Port 1 View  Result Date: 04/13/2019 CLINICAL DATA:  ET tube placement EXAM: PORTABLE CHEST 1 VIEW COMPARISON:  Chest radiograph 04/13/2019 FINDINGS: Neural placement of an endotracheal tube with tip between the thoracic inlet and carina. Stable cardiomediastinal contours. There are scattered linear opacities likely reflecting atelectasis. No new focal infiltrate. No pneumothorax or large pleural effusion. IMPRESSION: The endotracheal tube tip projects between the thoracic inlet and carina. No new acute finding. Electronically Signed   By: Audie Pinto M.D.   On: 04/13/2019 18:43   Dg Abdomen Acute W/chest  Result Date: 04/12/2019 CLINICAL DATA:  Vomiting, diarrhea. EXAM: DG ABDOMEN ACUTE W/ 1V CHEST COMPARISON:  01/20/2016 FINDINGS: Lungs are clear. Heart size is normal. No free air beneath either right or left hemidiaphragm. Stool and gas throughout the colon. No signs of bowel obstruction. No acute bone process. IMPRESSION: No acute cardiopulmonary disease. No acute intra-abdominal finding. Electronically Signed   By: Zetta Bills M.D.   On: 04/12/2019 12:49      ASSESSMENT AND PLAN      Acute Hypoxic Respiratory Failure -Currently sedated on mechanical ventilation -Was unresponsive and failure on BiPAP trial -Continue IV antibiotics and CRRT continue Full MV support -continue Bronchodilator Therapy -Wean Fio2 and PEEP as tolerated -will perform SAT/SBT when  respiratory parameters are met  Metformin toxicity -Profoundly elevated lactate too high to measure -Metformin levels unavailable with [facility] -Was taking Metformin 1000 mg twice daily with AKI-KDIGO-IV -Continue CRRT-Daily improvement noted -ICU monitoring   Renal Failure-AKI stage4 -nephrology on case -appreciate input, initiated CRRT, status post bicarbonate GTT -Severe metabolic derangements-repletion with CRRT bath per nephro with PharmD collaboration -follow chem 7 -follow UO -continue Foley Catheter-assess need daily -Creatinine was 1.0 as of October 2020 Per care everywhere  NEUROLOGY - intubated and sedated - minimal sedation to achieve a RASS goal: -1 Wake up assessment pending   Septic shock  -Due to E. coli UTI -  use vasopressors-Levophed with addition of vasopressin if necessary to keep MAP>65 -emperic ABX-continue Rocephin and Flagyl -Status post Solu-Cortef 100 twice x24 hours -procalcitonin trending down today 0.92   ID -continue IV abx as prescibed -follow up cultures  GI/Nutrition GI PROPHYLAXIS as indicated-Protonix 40 IV daily DIET-->TF's as tolerated Constipation protocol as indicated  ENDO - ICU hypoglycemic\Hyperglycemia protocol -check FSBS per protocol   ELECTROLYTES -follow labs as needed -replace as needed -pharmacy consultation   DVT/GI PRX ordered -SCDs  TRANSFUSIONS AS NEEDED MONITOR FSBS ASSESS the need for LABS as needed   Critical care provider statement:    Critical care time (minutes):  32   Critical care time was exclusive of:  Separately billable procedures and treating other patients   Critical care was necessary to treat or prevent imminent or life-threatening deterioration of the following conditions:   Altered mental status with lethargy, severe lactic acidosis, metformin toxicity, septic shock due to E. coli UTI, severe electrolyte derangement, stage IV acute kidney injury   Critical care was time spent personally  by me on the following activities:  Development of treatment plan with patient or surrogate, discussions with consultants, evaluation of patient's response to treatment, examination of patient, obtaining history from patient or surrogate, ordering and performing treatments and interventions, ordering and review of laboratory studies and re-evaluation of patient's condition.  I assumed direction of critical care for this patient from another provider in my specialty: no    This document was prepared using Dragon voice recognition software and may include unintentional dictation errors.    Ottie Glazier, M.D.  [facility] [place]

## 2019-04-15 DIAGNOSIS — J9601 Acute respiratory failure with hypoxia: Secondary | ICD-10-CM

## 2019-04-15 DIAGNOSIS — R6521 Severe sepsis with septic shock: Secondary | ICD-10-CM

## 2019-04-15 DIAGNOSIS — N170 Acute kidney failure with tubular necrosis: Secondary | ICD-10-CM

## 2019-04-15 DIAGNOSIS — N390 Urinary tract infection, site not specified: Secondary | ICD-10-CM

## 2019-04-15 DIAGNOSIS — B9620 Unspecified Escherichia coli [E. coli] as the cause of diseases classified elsewhere: Secondary | ICD-10-CM

## 2019-04-15 DIAGNOSIS — A419 Sepsis, unspecified organism: Principal | ICD-10-CM

## 2019-04-15 DIAGNOSIS — E872 Acidosis: Secondary | ICD-10-CM

## 2019-04-15 LAB — RENAL FUNCTION PANEL
Albumin: 2.5 g/dL — ABNORMAL LOW (ref 3.5–5.0)
Albumin: 2.5 g/dL — ABNORMAL LOW (ref 3.5–5.0)
Albumin: 2.8 g/dL — ABNORMAL LOW (ref 3.5–5.0)
Albumin: 2.7 g/dL — ABNORMAL LOW (ref 3.5–5.0)
Albumin: 2.8 g/dL — ABNORMAL LOW (ref 3.5–5.0)
Albumin: 2.8 g/dL — ABNORMAL LOW (ref 3.5–5.0)
Anion gap: 12 (ref 5–15)
Anion gap: 12 (ref 5–15)
Anion gap: 11 (ref 5–15)
Anion gap: 10 (ref 5–15)
Anion gap: 9 (ref 5–15)
Anion gap: 8 (ref 5–15)
BUN: 45 mg/dL — ABNORMAL HIGH (ref 8–23)
BUN: 47 mg/dL — ABNORMAL HIGH (ref 8–23)
BUN: 38 mg/dL — ABNORMAL HIGH (ref 8–23)
BUN: 35 mg/dL — ABNORMAL HIGH (ref 8–23)
BUN: 33 mg/dL — ABNORMAL HIGH (ref 8–23)
BUN: 28 mg/dL — ABNORMAL HIGH (ref 8–23)
CO2: 28 mmol/L (ref 22–32)
CO2: 28 mmol/L (ref 22–32)
CO2: 27 mmol/L (ref 22–32)
CO2: 27 mmol/L (ref 22–32)
CO2: 28 mmol/L (ref 22–32)
CO2: 27 mmol/L (ref 22–32)
Calcium: 6.8 mg/dL — ABNORMAL LOW (ref 8.9–10.3)
Calcium: 6.9 mg/dL — ABNORMAL LOW (ref 8.9–10.3)
Calcium: 7.1 mg/dL — ABNORMAL LOW (ref 8.9–10.3)
Calcium: 7 mg/dL — ABNORMAL LOW (ref 8.9–10.3)
Calcium: 7.1 mg/dL — ABNORMAL LOW (ref 8.9–10.3)
Calcium: 7.2 mg/dL — ABNORMAL LOW (ref 8.9–10.3)
Chloride: 96 mmol/L — ABNORMAL LOW (ref 98–111)
Chloride: 96 mmol/L — ABNORMAL LOW (ref 98–111)
Chloride: 99 mmol/L (ref 98–111)
Chloride: 100 mmol/L (ref 98–111)
Chloride: 100 mmol/L (ref 98–111)
Chloride: 102 mmol/L (ref 98–111)
Creatinine, Ser: 3.49 mg/dL — ABNORMAL HIGH (ref 0.61–1.24)
Creatinine, Ser: 3.63 mg/dL — ABNORMAL HIGH (ref 0.61–1.24)
Creatinine, Ser: 3.19 mg/dL — ABNORMAL HIGH (ref 0.61–1.24)
Creatinine, Ser: 2.7 mg/dL — ABNORMAL HIGH (ref 0.61–1.24)
Creatinine, Ser: 2.74 mg/dL — ABNORMAL HIGH (ref 0.61–1.24)
Creatinine, Ser: 2.29 mg/dL — ABNORMAL HIGH (ref 0.61–1.24)
GFR calc Af Amer: 17 mL/min — ABNORMAL LOW (ref >60)
GFR calc Af Amer: 17 mL/min — ABNORMAL LOW (ref >60)
GFR calc Af Amer: 19 mL/min — ABNORMAL LOW (ref >60)
GFR calc Af Amer: 24 mL/min — ABNORMAL LOW (ref >60)
GFR calc Af Amer: 23 mL/min — ABNORMAL LOW (ref >60)
GFR calc Af Amer: 29 mL/min — ABNORMAL LOW (ref >60)
GFR calc non Af Amer: 15 mL/min — ABNORMAL LOW (ref >60)
GFR calc non Af Amer: 14 mL/min — ABNORMAL LOW (ref >60)
GFR calc non Af Amer: 17 mL/min — ABNORMAL LOW (ref >60)
GFR calc non Af Amer: 21 mL/min — ABNORMAL LOW (ref >60)
GFR calc non Af Amer: 20 mL/min — ABNORMAL LOW (ref >60)
GFR calc non Af Amer: 25 mL/min — ABNORMAL LOW (ref >60)
Glucose, Bld: 207 mg/dL — ABNORMAL HIGH (ref 70–99)
Glucose, Bld: 205 mg/dL — ABNORMAL HIGH (ref 70–99)
Glucose, Bld: 176 mg/dL — ABNORMAL HIGH (ref 70–99)
Glucose, Bld: 215 mg/dL — ABNORMAL HIGH (ref 70–99)
Glucose, Bld: 203 mg/dL — ABNORMAL HIGH (ref 70–99)
Glucose, Bld: 263 mg/dL — ABNORMAL HIGH (ref 70–99)
Phosphorus: 4.1 mg/dL (ref 2.5–4.6)
Phosphorus: 4.2 mg/dL (ref 2.5–4.6)
Phosphorus: 3 mg/dL (ref 2.5–4.6)
Phosphorus: 2.8 mg/dL (ref 2.5–4.6)
Phosphorus: 2.5 mg/dL (ref 2.5–4.6)
Phosphorus: 1.9 mg/dL — ABNORMAL LOW (ref 2.5–4.6)
Potassium: 3.6 mmol/L (ref 3.5–5.1)
Potassium: 3.6 mmol/L (ref 3.5–5.1)
Potassium: 3.2 mmol/L — ABNORMAL LOW (ref 3.5–5.1)
Potassium: 3.5 mmol/L (ref 3.5–5.1)
Potassium: 3.5 mmol/L (ref 3.5–5.1)
Potassium: 3.7 mmol/L (ref 3.5–5.1)
Sodium: 136 mmol/L (ref 135–145)
Sodium: 136 mmol/L (ref 135–145)
Sodium: 137 mmol/L (ref 135–145)
Sodium: 137 mmol/L (ref 135–145)
Sodium: 137 mmol/L (ref 135–145)
Sodium: 137 mmol/L (ref 135–145)

## 2019-04-15 LAB — CBC WITH DIFFERENTIAL/PLATELET
Abs Immature Granulocytes: 0.07 10*3/uL (ref 0.00–0.07)
Basophils Absolute: 0 10*3/uL (ref 0.0–0.1)
Basophils Relative: 0 %
Eosinophils Absolute: 0 10*3/uL (ref 0.0–0.5)
Eosinophils Relative: 0 %
HCT: 19.2 % — ABNORMAL LOW (ref 39.0–52.0)
Hemoglobin: 6.7 g/dL — ABNORMAL LOW (ref 13.0–17.0)
Immature Granulocytes: 1 %
Lymphocytes Relative: 10 %
Lymphs Abs: 0.7 10*3/uL (ref 0.7–4.0)
MCH: 27.9 pg (ref 26.0–34.0)
MCHC: 34.9 g/dL (ref 30.0–36.0)
MCV: 80 fL (ref 80.0–100.0)
Monocytes Absolute: 0.9 10*3/uL (ref 0.1–1.0)
Monocytes Relative: 13 %
Neutro Abs: 5.2 10*3/uL (ref 1.7–7.7)
Neutrophils Relative %: 76 %
Platelets: 161 10*3/uL (ref 150–400)
RBC: 2.4 MIL/uL — ABNORMAL LOW (ref 4.22–5.81)
RDW: 15.5 % (ref 11.5–15.5)
WBC: 6.8 10*3/uL (ref 4.0–10.5)
nRBC: 0 % (ref 0.0–0.2)

## 2019-04-15 LAB — GLUCOSE, CAPILLARY
Glucose-Capillary: 134 mg/dL — ABNORMAL HIGH (ref 70–99)
Glucose-Capillary: 168 mg/dL — ABNORMAL HIGH (ref 70–99)
Glucose-Capillary: 170 mg/dL — ABNORMAL HIGH (ref 70–99)
Glucose-Capillary: 172 mg/dL — ABNORMAL HIGH (ref 70–99)
Glucose-Capillary: 193 mg/dL — ABNORMAL HIGH (ref 70–99)
Glucose-Capillary: 195 mg/dL — ABNORMAL HIGH (ref 70–99)
Glucose-Capillary: 219 mg/dL — ABNORMAL HIGH (ref 70–99)

## 2019-04-15 LAB — PREPARE RBC (CROSSMATCH)

## 2019-04-15 LAB — ABO/RH: ABO/RH(D): O POS

## 2019-04-15 LAB — HEMOGLOBIN AND HEMATOCRIT, BLOOD
HCT: 24.4 % — ABNORMAL LOW (ref 39.0–52.0)
Hemoglobin: 8 g/dL — ABNORMAL LOW (ref 13.0–17.0)

## 2019-04-15 LAB — PROCALCITONIN: Procalcitonin: 1.61 ng/mL

## 2019-04-15 LAB — MAGNESIUM: Magnesium: 2.1 mg/dL (ref 1.7–2.4)

## 2019-04-15 MED ORDER — SODIUM CHLORIDE 0.9% IV SOLUTION
Freq: Once | INTRAVENOUS | Status: AC
  Administered 2019-04-15: 15:00:00 via INTRAVENOUS

## 2019-04-15 MED ORDER — SODIUM CHLORIDE 0.9% FLUSH: 10.0000 mL | INTRAVENOUS | Status: DC | PRN

## 2019-04-15 MED ORDER — PRO-STAT SUGAR FREE PO LIQD
60.0000 mL | Freq: Three times a day (TID) | ORAL | Status: DC
  Administered 2019-04-15 – 2019-04-17 (×6): 60 mL

## 2019-04-15 MED ORDER — VITAL HIGH PROTEIN PO LIQD: 1000.0000 mL | ORAL | Status: DC

## 2019-04-15 MED ORDER — POTASSIUM CHLORIDE 10 MEQ/100ML IV SOLN
10.0000 meq | INTRAVENOUS | Status: AC
  Administered 2019-04-15 (×2): 10 meq via INTRAVENOUS
  Filled 2019-04-15 (×2): qty 100

## 2019-04-15 MED ORDER — B COMPLEX-C PO TABS
1.0000 | ORAL_TABLET | Freq: Every day | ORAL | Status: DC
  Administered 2019-04-15 – 2019-04-17 (×3): 1
  Filled 2019-04-15 (×4): qty 1

## 2019-04-15 MED ORDER — VITAL 1.5 CAL PO LIQD
1000.0000 mL | ORAL | Status: DC
  Administered 2019-04-15 – 2019-04-16 (×2): 1000 mL

## 2019-04-15 MED ORDER — ALBUMIN HUMAN 25 % IV SOLN
12.5000 g | Freq: Two times a day (BID) | INTRAVENOUS | Status: DC
  Administered 2019-04-15 (×2): 12.5 g via INTRAVENOUS
  Filled 2019-04-15 (×2): qty 50

## 2019-04-15 MED ORDER — SODIUM CHLORIDE 0.9 % IV SOLN
2.0000 g | INTRAVENOUS | Status: DC
  Administered 2019-04-16 – 2019-04-17 (×2): 2 g via INTRAVENOUS
  Filled 2019-04-15: qty 2
  Filled 2019-04-15: qty 20
  Filled 2019-04-15: qty 2

## 2019-04-15 NOTE — Progress Notes (Addendum)
CRITICAL CARE NOTE  CC  follow up acute renal failure, lactic acidosis and hyperkalemia   SUBJECTIVE Patient remains critically ill Intubated, mechanically ventilated and on CRRT Prognosis guarded.   BP (!) 132/53   Pulse 79   Temp 98.4 F (36.9 C) (Rectal)   Resp 14   Ht 6' 4"  (1.93 m)   Wt 110.4 kg   SpO2 97%   BMI 29.63 kg/m    I/O last 3 completed shifts: In: 7143.4 [I.V.:6168.3; NG/GT:25; IV Piggyback:950.1] Out: 4333.8 [Urine:900; Emesis/NG output:100; Other:3333.8] Total I/O In: 712.1 [I.V.:406.3; NG/GT:165.3; IV Piggyback:140.4] Out: 697.9 [Urine:162; Other:535.9]  SpO2: 97 % FiO2 (%): 30 %   SIGNIFICANT EVENTS  11/9: Pt remains on ventilator today at FiO2 of 30%. He is currently sedated on fentanyl and is on levophed. He is unresponsive. Pt remains on CRRT.   REVIEW OF SYSTEMS  PATIENT IS UNABLE TO PROVIDE COMPLETE REVIEW OF SYSTEMS DUE TO SEVERE CRITICAL ILLNESS   PHYSICAL EXAMINATION:  GENERAL intubated, minimal sedation for ventilator discomfort unresponsive otherwise. HEAD: Normocephalic, atraumatic.  EYES: Pupils equal, round, sluggishly reactive to light.  No scleral icterus.  MOUTH: ET tube in place, OG in place NECK: Supple.  Trachea midline. PULMONARY: Coarse breath sounds bilaterally, no wheezes CARDIOVASCULAR: S1 and S2. Regular rate and rhythm. No murmurs, rubs, or gallops.  GASTROINTESTINAL: Soft, non-distended. No masses. Positive bowel sounds.  MUSCULOSKELETAL: Edematous extremities. Missing first two toes on left foot  NEUROLOGIC: Analgesia provided for ventilator discomfort otherwise, unresponsive. SKIN:intact,warm,dry  MEDICATIONS: I have reviewed all medications and confirmed regimen as documented   CULTURE RESULTS   Recent Results (from the past 240 hour(s))  Blood culture (routine x 2)     Status: None (Preliminary result)   Collection Time: 04/12/19 11:58 AM   Specimen: BLOOD  Result Value Ref Range Status   Specimen  Description BLOOD RIGHT ANTECUBITAL  Final   Special Requests   Final    BOTTLES DRAWN AEROBIC AND ANAEROBIC Blood Culture adequate volume   Culture   Final    NO GROWTH 3 DAYS Performed at [facility], [address] [place], [place] [address]    Report Status PENDING  Incomplete  SARS CORONAVIRUS 2 (TAT 6-24 HRS) Nasopharyngeal Nasopharyngeal Swab     Status: None   Collection Time: 04/12/19 11:58 AM   Specimen: Nasopharyngeal Swab  Result Value Ref Range Status   SARS Coronavirus 2 NEGATIVE NEGATIVE Final    Comment: (NOTE) SARS-CoV-2 target nucleic acids are NOT DETECTED. The SARS-CoV-2 RNA is generally detectable in upper and lower respiratory specimens during the acute phase of infection. Negative results do not preclude SARS-CoV-2 infection, do not rule out co-infections with other pathogens, and should not be used as the sole basis for treatment or other patient management decisions. Negative results must be combined with clinical observations, patient history, and epidemiological information. The expected result is Negative. Fact Sheet for Patients: SugarRoll.be Fact Sheet for Healthcare Providers: https://www.woods-mathews.com/ This test is not yet approved or cleared by the [place] FDA and  has been authorized for detection and/or diagnosis of SARS-CoV-2 by FDA under an Emergency Use Authorization (EUA). This EUA will remain  in effect (meaning this test can be used) for the duration of the COVID-19 declaration under Section 56 4(b)(1) of the Act, 21 U.S.C. section 360bbb-3(b)(1), unless the authorization is terminated or revoked sooner. Performed at [facility], [place] [address], [place], [place] [address]   Urine Culture  Status: Abnormal   Collection Time: 04/12/19  2:18 PM   Specimen: Urine, Catheterized  Result Value Ref Range Status   Specimen Description   Final    URINE,  CATHETERIZED Performed at [facility], [address] [place], [place] [address]    Special Requests   Final    NONE Performed at [facility], [address] [place], [place] [address]    Culture >=100,000 COLONIES/mL ESCHERICHIA COLI (A)  Final   Report Status 04/14/2019 FINAL  Final   Organism ID, Bacteria ESCHERICHIA COLI (A)  Final      Susceptibility   Escherichia coli - MIC*    AMPICILLIN >=32 RESISTANT Resistant     CEFAZOLIN <=4 SENSITIVE Sensitive     CEFTRIAXONE <=1 SENSITIVE Sensitive     CIPROFLOXACIN <=0.25 SENSITIVE Sensitive     GENTAMICIN <=1 SENSITIVE Sensitive     IMIPENEM <=0.25 SENSITIVE Sensitive     NITROFURANTOIN <=16 SENSITIVE Sensitive     TRIMETH/SULFA <=20 SENSITIVE Sensitive     AMPICILLIN/SULBACTAM >=32 RESISTANT Resistant     PIP/TAZO <=4 SENSITIVE Sensitive     Extended ESBL NEGATIVE Sensitive     * >=100,000 COLONIES/mL ESCHERICHIA COLI  MRSA PCR Screening     Status: None   Collection Time: 04/13/19  5:59 AM   Specimen: Nasal Mucosa; Nasopharyngeal  Result Value Ref Range Status   MRSA by PCR NEGATIVE NEGATIVE Final    Comment:        The GeneXpert MRSA Assay (FDA approved for NASAL specimens only), is one component of a comprehensive MRSA colonization surveillance program. It is not intended to diagnose MRSA infection nor to guide or monitor treatment for MRSA infections. Performed at [facility], [address] [place], [place] [address]           IMAGING    No results found.     Indwelling Urinary Catheter continued, requirement due to   Reason to continue Indwelling Urinary Catheter strict Intake/Output monitoring for hemodynamic instability   Central Line/ continued, requirement due to  Reason to continue [place] of central venous pressure or other hemodynamic parameters and poor IV access   Ventilator continued, requirement due to severe respiratory failure    Ventilator Sedation RASS 0 to -2      ASSESSMENT AND PLAN SYNOPSIS   Severe ACUTE Hypoxic and Hypercapnic Respiratory Failure -continue Full MV support  -Wean Fio2 and PEEP as tolerated -will perform SAT/SBT when respiratory parameters are met   ACUTE KIDNEY INJURY/Renal Failure -appreciate nephrology consult and recommendations -Discussed with Dr. Juleen China -continue CRRT today, transition to IHD tomorrow  -follow chem 7 -follow UO -continue Foley Catheter-assess need -Avoid nephrotoxic agents -Recheck creatinine - slowly improving (3.19 today) -Low albumin (2.8) - replace  -lactic acidosis correcting, due to septic shock  NEUROLOGY - intubated and sedated on fentanyl  - minimal sedation to achieve a RASS goal: -1 Wake up assessment pending   SHOCK-SEPTIC/HYPOVOLEMIC/CARDIOGENIC -use vasopressors to keep MAP>65 - on levophed 4 mcg/min  -follow ABG and LA -follow up cultures -On antibiotics -Volume resuscitation as per parameters  HEME -Hbg 6.7 - transfuse 1 unit PRBCs -Hold subQ heparin   CARDIAC ICU monitoring  ID -continue IV abx as prescribed -on ceftriaxone -E. coli in urine.  Resistant to ampicillin and Unasyn. -WBC improving (6.8 today)  GI GI PROPHYLAXIS as indicated  NUTRITIONAL STATUS TFs with moderate sliding scale coverage as needed Constipation protocol as indicated  ENDO - will  use ICU hypoglycemic\Hyperglycemia protocol if indicated   ELECTROLYTES -follow labs as needed -replace as needed - low potassium (3.2), Mg (2.1) - monitor  -pharmacy consultation and following  HEME- -Anemia-chronic kidney disease, aggravated by critical illness -Transfuse 1 unit PRBCs  DVT/GI PRX ordered On ICU hyperglycemia protocol ASSESS the need for LABS as needed   Critical Care Time devoted to patient care services described in this note is 40 minutes.   Overall, patient is critically ill, prognosis is guarded.     Clance Boll,  Student-PA -acted as my Education administrator.   Renold Don, MD [facility]   This chart was  dictated using voice recognition software/Dragon.  Despite best efforts to proofread, errors can occur which can change the meaning.  Any change was purely unintentional.

## 2019-04-15 NOTE — Progress Notes (Signed)
[facility]  ROUNDING NOTE   Subjective:   CRRT UF 2309 UOP 700 hgb 6.7   Objective:  Vital signs in last 24 hours:  Temp:  [97.9 F (36.6 C)-98.4 F (36.9 C)] 98.2 F (36.8 C) (11/09 0900) Pulse Rate:  [66-77] 69 (11/09 0900) Resp:  [11-19] 17 (11/09 0900) BP: (99-140)/(47-63) 110/51 (11/09 0900) SpO2:  [97 %-100 %] 100 % (11/09 0900) FiO2 (%):  [30 %] 30 % (11/09 0747)  Weight change:  Filed Weights   04/12/19 1148 04/13/19 1412 04/14/19 0500  Weight: 127 kg 107.5 kg 110.4 kg    Intake/Output: I/O last 3 completed shifts: In: 7143.4 [I.V.:6168.3; NG/GT:25; IV Piggyback:950.1] Out: 4333.8 [Urine:900; Emesis/NG output:100; Other:3333.8]   Intake/Output this shift:  Total I/O In: 245.2 [I.V.:245.2] Out: 188.9 [Urine:110; Other:78.9]  Physical Exam: General: Critically ill   Head: ETT  Eyes: Anicteric  Neck:   trachea midline  Lungs:  Bilateral rhonchi, PRVC FiO2 30%  Heart: regular  Abdomen:   bowel sounds present  Extremities: Trace peripheral edema.  Neurologic: Intubated, sedated  Skin: No lesions  Access:  Right femoral temporary hemodialysis catheter 28/3    Basic Metabolic Panel: Recent Labs  Lab 04/14/19 0134 04/14/19 0525 04/14/19 1058 04/14/19 1526 04/14/19 1722 04/15/19 0253 04/15/19 0553  NA 137 138 137 135 137 136 136  K 5.8* 3.2* 3.6 4.5 4.1 3.6 3.6  CL 102 100 99 98 99 96* 96*  CO2 <7* 20* _0 GLUCOSE 288* 196* 156* 140* 138* 207* 205*  BUN 64* 54* 37* 40* 37* 45* 47*  CREATININE 5.95* 4.13* 2.78* 3.04* 2.90* 3.49* 3.63*  CALCIUM 7.2* 7.0* 7.0* 6.7* 6.8* 6.8* 6.9*  MG 1.8 1.8  --   --   --   --  2.1  PHOS 8.4* 5.0* 3.5 3.6 3.2 4.1 4.2    Liver Function Tests: Recent Labs  Lab 04/12/19 1154 04/13/19 0355  04/13/19 1323  04/14/19 0525 04/14/19 1058 04/14/19 1526 04/14/19 1722 04/15/19 0253 04/15/19 0553  AST 87* 89*  --  87*  --  159*  --   --   --   --   --   ALT 48* 56*  --  68*  --  108*   --   --   --   --   --   ALKPHOS 68 60  --  59  --  54  --   --   --   --   --   BILITOT 0.7 0.7  --  1.0  --  0.7  --   --   --   --   --   PROT 7.1 6.1*  --  6.0*  --  5.5*  --   --   --   --   --   ALBUMIN 3.1* 2.6*   < > 2.7*   < > 2.7*  2.8* 2.7* 2.7* 2.5* 2.5* 2.5*   < > = values in this interval not displayed.   No results for input(s): LIPASE, AMYLASE in the last 168 hours. Recent Labs  Lab 04/13/19 1046  AMMONIA 269*    CBC: Recent Labs  Lab 04/12/19 1154 04/13/19 0355 04/13/19 1837 04/13/19 2245 04/14/19 0134 04/15/19 0553  WBC 9.0 8.4  --  13.4* 13.6* 6.8  NEUTROABS 7.0 6.7  --   --  10.9* 5.2  HGB 9.9* 9.3* 7.9* 7.7* 7.8* 6.7*  HCT 31.0* 30.6*  --  24.2* 24.1* 19.2*  MCV 86.8 91.3  --  88.0 86.7 80.0  PLT 259 316  --  248 234 161    Cardiac Enzymes: No results for input(s): CKTOTAL, CKMB, CKMBINDEX, TROPONINI in the last 168 hours.  BNP: Invalid input(s): POCBNP  CBG: Recent Labs  Lab 04/14/19 1558 04/14/19 1929 04/15/19 0005 04/15/19 0401 04/15/19 0725  GLUCAP 115* 148* 168* 172* 193*    Microbiology: Results for orders placed or performed during the hospital encounter of 04/12/19  Blood culture (routine x 2)     Status: None (Preliminary result)   Collection Time: 04/12/19 11:58 AM   Specimen: BLOOD  Result Value Ref Range Status   Specimen Description BLOOD RIGHT ANTECUBITAL  Final   Special Requests   Final    BOTTLES DRAWN AEROBIC AND ANAEROBIC Blood Culture adequate volume   Culture   Final    NO GROWTH 3 DAYS Performed at [facility], [address] [place], [place] [address]    Report Status PENDING  Incomplete  SARS CORONAVIRUS 2 (TAT 6-24 HRS) Nasopharyngeal Nasopharyngeal Swab     Status: None   Collection Time: 04/12/19 11:58 AM   Specimen: Nasopharyngeal Swab  Result Value Ref Range Status   SARS Coronavirus 2 NEGATIVE NEGATIVE Final    Comment: (NOTE) SARS-CoV-2 target nucleic acids are NOT DETECTED. The  SARS-CoV-2 RNA is generally detectable in upper and lower respiratory specimens during the acute phase of infection. Negative results do not preclude SARS-CoV-2 infection, do not rule out co-infections with other pathogens, and should not be used as the sole basis for treatment or other patient management decisions. Negative results must be combined with clinical observations, patient history, and epidemiological information. The expected result is Negative. Fact Sheet for Patients: SugarRoll.be Fact Sheet for Healthcare Providers: https://www.woods-mathews.com/ This test is not yet approved or cleared by the [place] FDA and  has been authorized for detection and/or diagnosis of SARS-CoV-2 by FDA under an Emergency Use Authorization (EUA). This EUA will remain  in effect (meaning this test can be used) for the duration of the COVID-19 declaration under Section 56 4(b)(1) of the Act, 21 U.S.C. section 360bbb-3(b)(1), unless the authorization is terminated or revoked sooner. Performed at [facility], [place] [address], [place], [place] [address]   Urine Culture     Status: Abnormal   Collection Time: 04/12/19  2:18 PM   Specimen: Urine, Catheterized  Result Value Ref Range Status   Specimen Description   Final    URINE, CATHETERIZED Performed at [facility], [address] [place], [place] [address]    Special Requests   Final    NONE Performed at [facility], [address] [place], [place] [address]    Culture >=100,000 COLONIES/mL ESCHERICHIA COLI (A)  Final   Report Status 04/14/2019 FINAL  Final   Organism ID, Bacteria ESCHERICHIA COLI (A)  Final      Susceptibility   Escherichia coli - MIC*    AMPICILLIN >=32 RESISTANT Resistant     CEFAZOLIN <=4 SENSITIVE Sensitive     CEFTRIAXONE <=1 SENSITIVE Sensitive     CIPROFLOXACIN <=0.25 SENSITIVE Sensitive     GENTAMICIN <=1 SENSITIVE Sensitive      IMIPENEM <=0.25 SENSITIVE Sensitive     NITROFURANTOIN <=16 SENSITIVE Sensitive     TRIMETH/SULFA <=20 SENSITIVE Sensitive     AMPICILLIN/SULBACTAM >=32 RESISTANT Resistant     PIP/TAZO <=4 SENSITIVE Sensitive     Extended ESBL NEGATIVE Sensitive     * >=  100,000 COLONIES/mL ESCHERICHIA COLI  MRSA PCR Screening     Status: None   Collection Time: 04/13/19  5:59 AM   Specimen: Nasal Mucosa; Nasopharyngeal  Result Value Ref Range Status   MRSA by PCR NEGATIVE NEGATIVE Final    Comment:        The GeneXpert MRSA Assay (FDA approved for NASAL specimens only), is one component of a comprehensive MRSA colonization surveillance program. It is not intended to diagnose MRSA infection nor to guide or monitor treatment for MRSA infections. Performed at [facility], [address] [place], [place] [address]     Coagulation Studies: No results for input(s): LABPROT, INR in the last 72 hours.  Urinalysis: Recent Labs    04/12/19 1418  COLORURINE YELLOW*  LABSPEC 1.011  PHURINE 6.0  GLUCOSEU NEGATIVE  HGBUR MODERATE*  BILIRUBINUR NEGATIVE  KETONESUR NEGATIVE  PROTEINUR 100*  NITRITE NEGATIVE  LEUKOCYTESUR MODERATE*      Imaging: Ct Abdomen Pelvis Wo Contrast  Result Date: 04/13/2019 CLINICAL DATA:  Sepsis search, vomiting, diarrhea, cough EXAM: CT ABDOMEN AND PELVIS WITHOUT CONTRAST TECHNIQUE: Multidetector CT imaging of the abdomen and pelvis was performed following the standard protocol without IV contrast. COMPARISON:  01/18/2016 FINDINGS: Examination is generally limited by breath motion artifact throughout. Lower chest: Trace right pleural effusion. Three-vessel coronary artery calcifications. Hepatobiliary: No solid liver abnormality is seen. No gallstones, gallbladder wall thickening, or biliary dilatation. Pancreas: Unremarkable. No pancreatic ductal dilatation or surrounding inflammatory changes. Spleen: Normal in size without significant abnormality.  Adrenals/Urinary Tract: Adrenal glands are unremarkable. Multiple small bilateral nonobstructive renal calculi. Urinary bladder is decompressed by a Foley catheter although abnormally thickened. Stomach/Bowel: Stomach is within normal limits. Appendix is not clearly visualized. No evidence of bowel wall thickening, distention, or inflammatory changes. Vascular/Lymphatic: Aortic atherosclerosis. Right femoral vascular catheter, tip within the external iliac vein. No enlarged abdominal or pelvic lymph nodes. Reproductive: No mass or other significant abnormality. Other: No abdominal wall hernia or abnormality. No abdominopelvic ascites. Musculoskeletal: No acute or significant osseous findings. IMPRESSION: 1. Examination of the abdomen and pelvis is generally limited by breath motion artifact throughout as well as lack of intravenous contrast. 2. Urinary bladder is decompressed by a Foley catheter although abnormally thickened. This may be due to chronic outlet obstruction although correlate for urinalysis evidence of infectious or inflammatory cystitis. 3. No other CT findings of the abdomen or pelvis to suggest source of sepsis. 4. Bilateral nonobstructive nephrolithiasis. No ureteral calculi or hydronephrosis. 5.  Trace, nonspecific right pleural effusion. 6.  Coronary artery disease.  Aortic Atherosclerosis (ICD10-I70.0). Electronically Signed   By: Eddie Candle M.D.   On: 04/13/2019 13:46   Dg Abd 1 View  Result Date: 04/13/2019 CLINICAL DATA:  OG tube placed EXAM: ABDOMEN - 1 VIEW COMPARISON:  April 12, 2019 FINDINGS: The enteric tube projects over the gastric body. The bowel gas pattern is nonobstructive. IMPRESSION: Enteric tube projects over the gastric body. Electronically Signed   By: Constance Holster M.D.   On: 04/13/2019 19:06   Dg Chest Port 1 View  Result Date: 04/14/2019 CLINICAL DATA:  Acute respiratory failure. EXAM: PORTABLE CHEST 1 VIEW COMPARISON:  04/13/2019 FINDINGS: Endotracheal  tube has tip 4.1 cm above the carina. Enteric tube courses into the stomach and off the film as tip is not visualized. Lungs are adequately inflated and otherwise clear. Cardiomediastinal silhouette and remainder of the exam is unchanged. IMPRESSION: No acute cardiopulmonary disease. Tubes and lines as described. Electronically Signed  By: Marin Olp M.D.   On: 04/14/2019 09:00   Dg Chest Port 1 View  Result Date: 04/13/2019 CLINICAL DATA:  ET tube placement EXAM: PORTABLE CHEST 1 VIEW COMPARISON:  Chest radiograph 04/13/2019 FINDINGS: Neural placement of an endotracheal tube with tip between the thoracic inlet and carina. Stable cardiomediastinal contours. There are scattered linear opacities likely reflecting atelectasis. No new focal infiltrate. No pneumothorax or large pleural effusion. IMPRESSION: The endotracheal tube tip projects between the thoracic inlet and carina. No new acute finding. Electronically Signed   By: Audie Pinto M.D.   On: 04/13/2019 18:43     Medications:   .  prismasol BGK 4/2.5 1,500 mL/hr at 04/15/19 0842  .  prismasol BGK 4/2.5 500 mL/hr at 04/15/19 0841  . cefTRIAXone (ROCEPHIN)  IV Stopped (04/14/19 1756)  . fentaNYL infusion INTRAVENOUS 250 mcg/hr (04/15/19 1000)  . metronidazole Stopped (04/15/19 0323)  . norepinephrine (LEVOPHED) Adult infusion 8 mcg/min (04/15/19 1000)  . phenylephrine (NEO-SYNEPHRINE) Adult infusion Stopped (04/13/19 2255)  . prismasol BGK 4/2.5 2,000 mL/hr at 04/15/19 0842  . vasopressin (PITRESSIN) infusion - *FOR SHOCK* Stopped (04/15/19 0854)   . chlorhexidine gluconate (MEDLINE KIT)  15 mL Mouth Rinse BID  . Chlorhexidine Gluconate Cloth  6 each Topical Daily  . clopidogrel  75 mg Oral Daily  . heparin injection (subcutaneous)  5,000 Units Subcutaneous Q8H  . insulin aspart  0-15 Units Subcutaneous Q4H  . mouth rinse  15 mL Mouth Rinse 10 times per day  . pantoprazole (PROTONIX) IV  40 mg Intravenous Q24H   acetaminophen  **OR** acetaminophen, heparin, ipratropium-albuterol, midazolam, sodium chloride flush  Assessment/ Plan:  85 y.o.black [place] with congestive heart failure, coronary artery disease, diabetes mellitus type 2, hyperlipidemia, hypertension, myocardial infarction, stroke, who was admitted to [facility] on 04/12/2019 for nausea, diarrhea, low grade fever.   1.  Acute kidney injury, suspect acute tubular necrosis. 2.  Severe hyperkalemia. 3.  Metabolic acidosis. 4.  Acute respiratory failure. 5.  Hypotension. 6. Anemia of renal failure  Plan: requiring renal replacement therapy. Transition to intermittent hemodialysis if stable tomorrow.  Continues to require vasopressors.  - PRBC transfusion   LOS: 3   11/9/202010:10 AM

## 2019-04-15 NOTE — Progress Notes (Signed)
CRRT started at approximately 0900 this morning.

## 2019-04-16 ENCOUNTER — Inpatient Hospital Stay: Payer: Medicare Other

## 2019-04-16 LAB — RENAL FUNCTION PANEL
Albumin: 2.6 g/dL — ABNORMAL LOW (ref 3.5–5.0)
Albumin: 2.6 g/dL — ABNORMAL LOW (ref 3.5–5.0)
Albumin: 2.7 g/dL — ABNORMAL LOW (ref 3.5–5.0)
Albumin: 2.5 g/dL — ABNORMAL LOW (ref 3.5–5.0)
Anion gap: 8 (ref 5–15)
Anion gap: 6 (ref 5–15)
Anion gap: 8 (ref 5–15)
Anion gap: 7 (ref 5–15)
BUN: 27 mg/dL — ABNORMAL HIGH (ref 8–23)
BUN: 25 mg/dL — ABNORMAL HIGH (ref 8–23)
BUN: 22 mg/dL (ref 8–23)
BUN: 20 mg/dL (ref 8–23)
CO2: 27 mmol/L (ref 22–32)
CO2: 28 mmol/L (ref 22–32)
CO2: 27 mmol/L (ref 22–32)
CO2: 26 mmol/L (ref 22–32)
Calcium: 7.2 mg/dL — ABNORMAL LOW (ref 8.9–10.3)
Calcium: 7.4 mg/dL — ABNORMAL LOW (ref 8.9–10.3)
Calcium: 7.4 mg/dL — ABNORMAL LOW (ref 8.9–10.3)
Calcium: 7.4 mg/dL — ABNORMAL LOW (ref 8.9–10.3)
Chloride: 102 mmol/L (ref 98–111)
Chloride: 103 mmol/L (ref 98–111)
Chloride: 102 mmol/L (ref 98–111)
Chloride: 104 mmol/L (ref 98–111)
Creatinine, Ser: 2.29 mg/dL — ABNORMAL HIGH (ref 0.61–1.24)
Creatinine, Ser: 2.09 mg/dL — ABNORMAL HIGH (ref 0.61–1.24)
Creatinine, Ser: 2 mg/dL — ABNORMAL HIGH (ref 0.61–1.24)
Creatinine, Ser: 1.82 mg/dL — ABNORMAL HIGH (ref 0.61–1.24)
GFR calc Af Amer: 29 mL/min — ABNORMAL LOW (ref >60)
GFR calc Af Amer: 32 mL/min — ABNORMAL LOW (ref >60)
GFR calc Af Amer: 34 mL/min — ABNORMAL LOW (ref >60)
GFR calc Af Amer: 38 mL/min — ABNORMAL LOW (ref >60)
GFR calc non Af Amer: 25 mL/min — ABNORMAL LOW (ref >60)
GFR calc non Af Amer: 28 mL/min — ABNORMAL LOW (ref >60)
GFR calc non Af Amer: 30 mL/min — ABNORMAL LOW (ref >60)
GFR calc non Af Amer: 33 mL/min — ABNORMAL LOW (ref >60)
Glucose, Bld: 259 mg/dL — ABNORMAL HIGH (ref 70–99)
Glucose, Bld: 268 mg/dL — ABNORMAL HIGH (ref 70–99)
Glucose, Bld: 258 mg/dL — ABNORMAL HIGH (ref 70–99)
Glucose, Bld: 198 mg/dL — ABNORMAL HIGH (ref 70–99)
Phosphorus: 1.7 mg/dL — ABNORMAL LOW (ref 2.5–4.6)
Phosphorus: 1.5 mg/dL — ABNORMAL LOW (ref 2.5–4.6)
Phosphorus: 1.7 mg/dL — ABNORMAL LOW (ref 2.5–4.6)
Phosphorus: 2.2 mg/dL — ABNORMAL LOW (ref 2.5–4.6)
Potassium: 3.7 mmol/L (ref 3.5–5.1)
Potassium: 3.8 mmol/L (ref 3.5–5.1)
Potassium: 3.9 mmol/L (ref 3.5–5.1)
Potassium: 4.4 mmol/L (ref 3.5–5.1)
Sodium: 137 mmol/L (ref 135–145)
Sodium: 137 mmol/L (ref 135–145)
Sodium: 137 mmol/L (ref 135–145)
Sodium: 137 mmol/L (ref 135–145)

## 2019-04-16 LAB — CBC WITH DIFFERENTIAL/PLATELET
Abs Immature Granulocytes: 0.05 10*3/uL (ref 0.00–0.07)
Basophils Absolute: 0 10*3/uL (ref 0.0–0.1)
Basophils Relative: 0 %
Eosinophils Absolute: 0 10*3/uL (ref 0.0–0.5)
Eosinophils Relative: 0 %
HCT: 22.4 % — ABNORMAL LOW (ref 39.0–52.0)
Hemoglobin: 7.1 g/dL — ABNORMAL LOW (ref 13.0–17.0)
Immature Granulocytes: 1 %
Lymphocytes Relative: 9 %
Lymphs Abs: 0.5 10*3/uL — ABNORMAL LOW (ref 0.7–4.0)
MCH: 26.9 pg (ref 26.0–34.0)
MCHC: 31.7 g/dL (ref 30.0–36.0)
MCV: 84.8 fL (ref 80.0–100.0)
Monocytes Absolute: 0.7 10*3/uL (ref 0.1–1.0)
Monocytes Relative: 11 %
Neutro Abs: 4.9 10*3/uL (ref 1.7–7.7)
Neutrophils Relative %: 79 %
Platelets: 141 10*3/uL — ABNORMAL LOW (ref 150–400)
RBC: 2.64 MIL/uL — ABNORMAL LOW (ref 4.22–5.81)
RDW: 17.1 % — ABNORMAL HIGH (ref 11.5–15.5)
WBC: 6.2 10*3/uL (ref 4.0–10.5)
nRBC: 0 % (ref 0.0–0.2)

## 2019-04-16 LAB — GLUCOSE, CAPILLARY
Glucose-Capillary: 149 mg/dL — ABNORMAL HIGH (ref 70–99)
Glucose-Capillary: 157 mg/dL — ABNORMAL HIGH (ref 70–99)
Glucose-Capillary: 184 mg/dL — ABNORMAL HIGH (ref 70–99)
Glucose-Capillary: 198 mg/dL — ABNORMAL HIGH (ref 70–99)
Glucose-Capillary: 221 mg/dL — ABNORMAL HIGH (ref 70–99)
Glucose-Capillary: 243 mg/dL — ABNORMAL HIGH (ref 70–99)

## 2019-04-16 LAB — PREPARE RBC (CROSSMATCH)

## 2019-04-16 LAB — LACTIC ACID, PLASMA: Lactic Acid, Venous: 1.4 mmol/L (ref 0.5–1.9)

## 2019-04-16 LAB — HEPATITIS B SURFACE ANTIGEN: Hepatitis B Surface Ag: NONREACTIVE

## 2019-04-16 LAB — MAGNESIUM: Magnesium: 2.3 mg/dL (ref 1.7–2.4)

## 2019-04-16 LAB — HEPATITIS B CORE ANTIBODY, IGM: Hep B C IgM: NONREACTIVE

## 2019-04-16 LAB — HEPATITIS B SURFACE ANTIBODY,QUALITATIVE: Hep B S Ab: NONREACTIVE

## 2019-04-16 MED ORDER — PANTOPRAZOLE SODIUM 40 MG IV SOLR
40.0000 mg | INTRAVENOUS | Status: DC
  Administered 2019-04-16 – 2019-04-17 (×2): 40 mg via INTRAVENOUS
  Filled 2019-04-16 (×2): qty 40

## 2019-04-16 MED ORDER — POTASSIUM PHOSPHATES 15 MMOLE/5ML IV SOLN
20.0000 mmol | Freq: Once | INTRAVENOUS | Status: AC
  Administered 2019-04-16: 20 mmol via INTRAVENOUS
  Filled 2019-04-16: qty 6.67

## 2019-04-16 MED ORDER — LATANOPROST 0.005 % OP SOLN
1.0000 [drp] | Freq: Every day | OPHTHALMIC | Status: DC
  Administered 2019-04-16 – 2019-04-17 (×2): 1 [drp] via OPHTHALMIC
  Filled 2019-04-16 (×2): qty 2.5

## 2019-04-16 MED ORDER — SODIUM CHLORIDE 0.9% IV SOLUTION
Freq: Once | INTRAVENOUS | Status: AC
  Administered 2019-04-16: 12:00:00 via INTRAVENOUS

## 2019-04-16 MED ORDER — INSULIN ASPART 100 UNIT/ML ~~LOC~~ SOLN
3.0000 [IU] | SUBCUTANEOUS | Status: DC
  Administered 2019-04-16 – 2019-04-18 (×6): 3 [IU] via SUBCUTANEOUS
  Filled 2019-04-16 (×6): qty 1

## 2019-04-16 MED ORDER — HEPARIN SODIUM (PORCINE) 5000 UNIT/ML IJ SOLN
INTRAMUSCULAR | Status: AC
  Administered 2019-04-16: 05:00:00
  Filled 2019-04-16: qty 1

## 2019-04-16 NOTE — Progress Notes (Signed)
[facility]  ROUNDING NOTE   Subjective:   CRRT UF of 2231.32m.  UOP 3256m 1 unit PRBC transfusion today.   Tmax 100.2   Objective:  Vital signs in last 24 hours:  Temp:  [98.1 F (36.7 C)-100.2 F (37.9 C)] 99.5 F (37.5 C) (11/10 0900) Pulse Rate:  [73-81] 79 (11/10 0900) Resp:  [4-25] 17 (11/10 0900) BP: (95-132)/(44-64) 106/49 (11/10 0900) SpO2:  [96 %-100 %] 98 % (11/10 0900) FiO2 (%):  [30 %] 30 % (11/10 0727) Weight:  [110.2 kg] 110.2 kg (11/10 0500)  Weight change:  Filed Weights   04/13/19 1412 04/14/19 0500 04/16/19 0500  Weight: 107.5 kg 110.4 kg 110.2 kg    Intake/Output: I/O last 3 completed shifts: In: 4904.1 [I.V.:2850.4; Blood:340; Other:151; NG/GT:825.3; IV Piggyback:737.3] Out: 2831.9 [Urine:600; Other:2231.9]   Intake/Output this shift:  Total I/O In: 208 [I.V.:88; NG/GT:120] Out: 168 [Other:168]  Physical Exam: General: Critically ill   Head: ETT  Eyes: Anicteric  Neck:   trachea midline  Lungs:  Bilateral rhonchi, PRVC FiO2 30%  Heart: regular  Abdomen:   bowel sounds present  Extremities: Trace peripheral edema.  Neurologic: Intubated, sedated  Skin: No lesions  Access:  Right femoral temporary hemodialysis catheter 1101/2  Basic Metabolic Panel: Recent Labs  Lab 04/14/19 0134 04/14/19 0525  04/15/19 0553  04/15/19 1641 04/15/19 1814 04/15/19 2300 04/16/19 0237 04/16/19 0551  NA 137 138   < > 136   < > 137 137 137 137 137  K 5.8* 3.2*   < > 3.6   < > 3.5 3.5 3.7 3.7 3.8  CL 102 100   < > 96*   < > 100 100 102 102 103  CO2 <7* 20*   < > 28   < > '27 28 27 27 28  ' GLUCOSE 288* 196*   < > 205*   < > 215* 203* 263* 259* 268*  BUN 64* 54*   < > 47*   < > 35* 33* 28* 27* 25*  CREATININE 5.95* 4.13*   < > 3.63*   < > 2.70* 2.74* 2.29* 2.29* 2.09*  CALCIUM 7.2* 7.0*   < > 6.9*   < > 7.0* 7.1* 7.2* 7.2* 7.4*  MG 1.8 1.8  --  2.1  --   --   --   --   --  2.3  PHOS 8.4* 5.0*   < > 4.2   < > 2.8 2.5 1.9* 1.7* 1.5*   <  > = values in this interval not displayed.    Liver Function Tests: Recent Labs  Lab 04/12/19 1154 04/13/19 0355  04/13/19 1323  04/14/19 0525  04/15/19 1641 04/15/19 1814 04/15/19 2300 04/16/19 0237 04/16/19 0551  AST 87* 89*  --  87*  --  159*  --   --   --   --   --   --   ALT 48* 56*  --  68*  --  108*  --   --   --   --   --   --   ALKPHOS 68 60  --  59  --  54  --   --   --   --   --   --   BILITOT 0.7 0.7  --  1.0  --  0.7  --   --   --   --   --   --   PROT 7.1 6.1*  --  6.0*  --  5.5*  --   --   --   --   --   --   ALBUMIN 3.1* 2.6*   < > 2.7*   < > 2.7*  2.8*   < > 2.7* 2.8* 2.8* 2.6* 2.6*   < > = values in this interval not displayed.   No results for input(s): LIPASE, AMYLASE in the last 168 hours. Recent Labs  Lab 04/13/19 1046  AMMONIA 269*    CBC: Recent Labs  Lab 04/12/19 1154 04/13/19 0355  04/13/19 2245 04/14/19 0134 04/15/19 0553 04/15/19 1814 04/16/19 0551  WBC 9.0 8.4  --  13.4* 13.6* 6.8  --  6.2  NEUTROABS 7.0 6.7  --   --  10.9* 5.2  --  4.9  HGB 9.9* 9.3*   < > 7.7* 7.8* 6.7* 8.0* 7.1*  HCT 31.0* 30.6*  --  24.2* 24.1* 19.2* 24.4* 22.4*  MCV 86.8 91.3  --  88.0 86.7 80.0  --  84.8  PLT 259 316  --  248 234 161  --  141*   < > = values in this interval not displayed.    Cardiac Enzymes: No results for input(s): CKTOTAL, CKMB, CKMBINDEX, TROPONINI in the last 168 hours.  BNP: Invalid input(s): POCBNP  CBG: Recent Labs  Lab 04/15/19 1628 04/15/19 1941 04/15/19 2338 04/16/19 0343 04/16/19 0737  GLUCAP 170* 219* 195* 221* 198*    Microbiology: Results for orders placed or performed during the hospital encounter of 04/12/19  Blood culture (routine x 2)     Status: None (Preliminary result)   Collection Time: 04/12/19 11:58 AM   Specimen: BLOOD  Result Value Ref Range Status   Specimen Description BLOOD RIGHT ANTECUBITAL  Final   Special Requests   Final    BOTTLES DRAWN AEROBIC AND ANAEROBIC Blood Culture adequate volume    Culture   Final    NO GROWTH 4 DAYS Performed at [facility], [address] [place], [place] [address]    Report Status PENDING  Incomplete  SARS CORONAVIRUS 2 (TAT 6-24 HRS) Nasopharyngeal Nasopharyngeal Swab     Status: None   Collection Time: 04/12/19 11:58 AM   Specimen: Nasopharyngeal Swab  Result Value Ref Range Status   SARS Coronavirus 2 NEGATIVE NEGATIVE Final    Comment: (NOTE) SARS-CoV-2 target nucleic acids are NOT DETECTED. The SARS-CoV-2 RNA is generally detectable in upper and lower respiratory specimens during the acute phase of infection. Negative results do not preclude SARS-CoV-2 infection, do not rule out co-infections with other pathogens, and should not be used as the sole basis for treatment or other patient management decisions. Negative results must be combined with clinical observations, patient history, and epidemiological information. The expected result is Negative. Fact Sheet for Patients: SugarRoll.be Fact Sheet for Healthcare Providers: https://www.woods-mathews.com/ This test is not yet approved or cleared by the [place] FDA and  has been authorized for detection and/or diagnosis of SARS-CoV-2 by FDA under an Emergency Use Authorization (EUA). This EUA will remain  in effect (meaning this test can be used) for the duration of the COVID-19 declaration under Section 56 4(b)(1) of the Act, 21 U.S.C. section 360bbb-3(b)(1), unless the authorization is terminated or revoked sooner. Performed at [facility], [place] [address], [place], [place] [address]   Urine Culture     Status: Abnormal   Collection Time: 04/12/19  2:18 PM   Specimen: Urine, Catheterized  Result Value Ref Range Status   Specimen Description  Final    URINE, CATHETERIZED Performed at [facility], [address] [place], [place] [address]    Special Requests   Final    NONE Performed at [facility], [address] [place], [place] [address]    Culture >=100,000 COLONIES/mL ESCHERICHIA COLI (A)  Final   Report Status 04/14/2019 FINAL  Final   Organism ID, Bacteria ESCHERICHIA COLI (A)  Final      Susceptibility   Escherichia coli - MIC*    AMPICILLIN >=32 RESISTANT Resistant     CEFAZOLIN <=4 SENSITIVE Sensitive     CEFTRIAXONE <=1 SENSITIVE Sensitive     CIPROFLOXACIN <=0.25 SENSITIVE Sensitive     GENTAMICIN <=1 SENSITIVE Sensitive     IMIPENEM <=0.25 SENSITIVE Sensitive     NITROFURANTOIN <=16 SENSITIVE Sensitive     TRIMETH/SULFA <=20 SENSITIVE Sensitive     AMPICILLIN/SULBACTAM >=32 RESISTANT Resistant     PIP/TAZO <=4 SENSITIVE Sensitive     Extended ESBL NEGATIVE Sensitive     * >=100,000 COLONIES/mL ESCHERICHIA COLI  MRSA PCR Screening     Status: None   Collection Time: 04/13/19  5:59 AM   Specimen: Nasal Mucosa; Nasopharyngeal  Result Value Ref Range Status   MRSA by PCR NEGATIVE NEGATIVE Final    Comment:        The GeneXpert MRSA Assay (FDA approved for NASAL specimens only), is one component of a comprehensive MRSA colonization surveillance program. It is not intended to diagnose MRSA infection nor to guide or monitor treatment for MRSA infections. Performed at [facility], [address] [place], [place] [address]     Coagulation Studies: No results for input(s): LABPROT, INR in the last 72 hours.  Urinalysis: No results for input(s): COLORURINE, LABSPEC, PHURINE, GLUCOSEU, HGBUR, BILIRUBINUR, KETONESUR, PROTEINUR, UROBILINOGEN, NITRITE, LEUKOCYTESUR in the last 72 hours.  Invalid input(s): APPERANCEUR    Imaging: No results found.   Medications:   .  prismasol BGK 4/2.5 1,500 mL/hr at 04/16/19 0831  .  prismasol BGK 4/2.5 500 mL/hr at 04/16/19 0255  . albumin human Stopped (04/16/19 0814)  . cefTRIAXone (ROCEPHIN)  IV    . fentaNYL infusion INTRAVENOUS 200 mcg/hr (04/16/19 0917)  . norepinephrine (LEVOPHED) Adult  infusion Stopped (04/16/19 0544)  . potassium PHOSPHATE IVPB (in mmol)    . prismasol BGK 4/2.5 2,000 mL/hr at 04/16/19 0734   . B-complex with vitamin C  1 tablet Per Tube Daily  . chlorhexidine gluconate (MEDLINE KIT)  15 mL Mouth Rinse BID  . Chlorhexidine Gluconate Cloth  6 each Topical Daily  . clopidogrel  75 mg Oral Daily  . feeding supplement (PRO-STAT SUGAR FREE 64)  60 mL Per Tube TID  . feeding supplement (VITAL 1.5 CAL)  1,000 mL Per Tube Q24H  . insulin aspart  0-15 Units Subcutaneous Q4H  . insulin aspart  3 Units Subcutaneous Q4H  . latanoprost  1 drop Both Eyes QHS  . mouth rinse  15 mL Mouth Rinse 10 times per day  . pantoprazole (PROTONIX) IV  40 mg Intravenous Q24H   acetaminophen **OR** acetaminophen, heparin, ipratropium-albuterol, midazolam, sodium chloride flush  Assessment/ Plan:  85 y.o.black male with congestive heart failure, coronary artery disease, diabetes mellitus type 2, hyperlipidemia, hypertension, myocardial infarction, stroke, who was admitted to [facility] on 04/12/2019 for nausea, diarrhea, low grade fever.   1.  Acute kidney injury: baseline creatinine of 1.11, GFR of >60 on 04/05/2019 2.  hyperkalemia. 3.  Metabolic acidosis. 4.  Acute  respiratory failure. 5.  Hypotension. 6. Anemia of renal failure  Plan: requiring renal replacement therapy. Transition to intermittent hemodialysis when more stable Continues to wean /maintain off vasopressors.  - PRBC transfusion as per ICU team.    LOS: 4 Tremeka Helbling 11/10/202010:18 AM

## 2019-04-16 NOTE — Consult Note (Signed)
[place] [facility] for Electrolyte Monitoring and Replacement   Recent Labs: Potassium (mmol/L)  Date Value  04/16/2019 3.8  04/14/2014 4.5   Magnesium (mg/dL)  Date Value  04/16/2019 2.3   Calcium (mg/dL)  Date Value  04/16/2019 7.4 (L)   Calcium, Total (mg/dL)  Date Value  04/14/2014 9.1   Albumin (g/dL)  Date Value  04/16/2019 2.6 (L)  04/14/2014 3.9   Phosphorus (mg/dL)  Date Value  04/16/2019 1.5 (L)   Sodium (mmol/L)  Date Value  04/16/2019 137  04/14/2014 138   Corrected Ca: 8.52 mg/dL  Assessment: 85 y.o.black male with congestive heart failure, coronary artery disease, diabetes mellitus type 2, hyperlipidemia, hypertension, myocardial infarction, stroke, who was admitted to [facility] on11/6/2020for nausea, diarrhea,low grade fever.  Goal of Therapy:  Potassium 4.0 - 5.1 mmol/L Magnesium 2.0 - 2.4 mg/dL Other electrolytes WNL  Plan:   20 mmol IV potassium phosphate. This provides 29.5 mEq potassium  Renal function panel and magnesium in am  Dallie Piles ,PharmD Clinical Pharmacist 04/16/2019 7:16 AM

## 2019-04-16 NOTE — Progress Notes (Signed)
CRRT stopped and pt rinsed back at 1807.

## 2019-04-16 NOTE — Progress Notes (Signed)
Inpatient Diabetes Program Recommendations  AACE/ADA: New Consensus Statement on Inpatient Glycemic Control (2015)  Target Ranges:  Prepandial:   less than 140 mg/dL      Peak postprandial:   less than 180 mg/dL (1-2 hours)      Critically ill patients:  140 - 180 mg/dL   Results for GRIFFEN, LUZZI (MRN [id number]) as of 04/16/2019 07:25  Ref. Range 04/15/2019 00:05 04/15/2019 04:01 04/15/2019 07:25 04/15/2019 11:56 04/15/2019 16:28 04/15/2019 19:41  Glucose-Capillary Latest Ref Range: 70 - 99 mg/dL 168 (H)  3 units NOVOLOG  172 (H)  5 units NOVOLOG  193 (H)  3 units NOVOLOG  134 (H)  2 units NOVOLOG  170 (H)  3 units NOVOLOG  219 (H)  5 units NOVOLOG    Results for DEWAYNE, KOK (MRN [id number]) as of 04/16/2019 07:25  Ref. Range 04/15/2019 23:38 04/16/2019 03:43  Glucose-Capillary Latest Ref Range: 70 - 99 mg/dL 195 (H)  3 units NOVOLOG  221 (H)  5 units NOVOLOG     Admit with: Acute Renal Failure/ Hyperkalemia/ AMS/ Acute Resp Failure  History: DM, CHF, CVA  SNF DM Meds: Levemir 10 units Daily     Novolog 0-10 units TID before meals      Tradjenta 5 mg Daily     Metformin 1000 mg BID  Current Orders: Novolog Moderate Correction Scale/ SSI (0-15 units) Q4 hours     Remains Intubated--Getting CRRT.  Vital tube feeds started yesterday around 11am at 40cc/hr.  Steroids stopped--Last dose Solucortef given 11am on 11/08.    MD- Please consider adding Novolog Tube Feed Coverage to current inpatient insulin regimen:  Novolog 3 units Q4 hours  HOLD if tube feeds HELD for any reason     --Will follow patient during hospitalization--  Wyn Quaker RN, MSN, CDE Diabetes Coordinator Inpatient Glycemic Control Team Team Pager: [phone] (8a-5p)

## 2019-04-16 NOTE — Progress Notes (Addendum)
CRITICAL CARE NOTE  CC  follow up acute renal failure and septic shock and lactic acidosis   SUBJECTIVE Patient remains critically ill Prognosis is guarded  Pt remains intubated on ventilator at FiO2 35%. He is off pressors. Remains on CRRT.    BP (!) 120/51   Pulse 85   Temp 99.7 F (37.6 C)   Resp 17   Ht 6' 4"  (1.93 m)   Wt 110.2 kg   SpO2 93%   BMI 29.57 kg/m    I/O last 3 completed shifts: In: 4904.1 [I.V.:2850.4; Blood:340; Other:151; NG/GT:825.3; IV Piggyback:737.3] Out: 2831.9 [Urine:600; Other:2231.9] Total I/O In: 337.5 [I.V.:137.5; NG/GT:200] Out: 293 [Other:293]  SpO2: 93 % FiO2 (%): 30 %   SIGNIFICANT EVENTS 11/9: Pt remains on ventilator today at FiO2 of 30%. He is currently sedated on fentanyl and is on levophed. He is unresponsive. Pt remains on CRRT.  11/10: Off pressors. Plan to switch from CRRT to HD today. Responsive to verbal stimuli today. Opens eyes to command. Moves extremities, but not purposefully.  Does not track.  On fentanyl drip for ventilator discomfort, low-dose.   REVIEW OF SYSTEMS  PATIENT IS UNABLE TO PROVIDE COMPLETE REVIEW OF SYSTEMS DUE TO SEVERE CRITICAL ILLNESS   PHYSICAL EXAMINATION:  GENERAL: intubated, responsive to verbal stimuli, does not track HEAD: Normocephalic, atraumatic.  EYES: Pupils equal, round, nonreactive to light.  No scleral icterus.  MOUTH: ET and OG tube in place NECK: Supple. Trachea midline.  PULMONARY: Coarse breath sounds bilaterally CARDIOVASCULAR: S1 and S2. Regular rate and rhythm. No murmurs, rubs, or gallops.  GASTROINTESTINAL: Soft, non-distended. No masses. Positive bowel sounds.  MUSCULOSKELETAL: Edematous extremities (2+ anasarca). Missing first two toes on left foot  NEUROLOGIC: Responsive to verbal stimuli. Opens eyes to command. Non-purposeful movement of extremities  SKIN:intact,warm,dry  MEDICATIONS: I have reviewed all medications and confirmed regimen as documented   CULTURE  RESULTS   Recent Results (from the past 240 hour(s))  Blood culture (routine x 2)     Status: None (Preliminary result)   Collection Time: 04/12/19 11:58 AM   Specimen: BLOOD  Result Value Ref Range Status   Specimen Description BLOOD RIGHT ANTECUBITAL  Final   Special Requests   Final    BOTTLES DRAWN AEROBIC AND ANAEROBIC Blood Culture adequate volume   Culture   Final    NO GROWTH 4 DAYS Performed at [facility], [address], [place], [place] [address]    Report Status PENDING  Incomplete  SARS CORONAVIRUS 2 (TAT 6-24 HRS) Nasopharyngeal Nasopharyngeal Swab     Status: None   Collection Time: 04/12/19 11:58 AM   Specimen: Nasopharyngeal Swab  Result Value Ref Range Status   SARS Coronavirus 2 NEGATIVE NEGATIVE Final    Comment: (NOTE) SARS-CoV-2 target nucleic acids are NOT DETECTED. The SARS-CoV-2 RNA is generally detectable in upper and lower respiratory specimens during the acute phase of infection. Negative results do not preclude SARS-CoV-2 infection, do not rule out co-infections with other pathogens, and should not be used as the sole basis for treatment or other patient management decisions. Negative results must be combined with clinical observations, patient history, and epidemiological information. The expected result is Negative. Fact Sheet for Patients: SugarRoll.be Fact Sheet for Healthcare Providers: https://www.woods-mathews.com/ This test is not yet approved or cleared by the [place] FDA and  has been authorized for detection and/or diagnosis of SARS-CoV-2 by FDA under an Emergency Use Authorization (EUA). This EUA will remain  in effect (meaning this  test can be used) for the duration of the COVID-19 declaration under Section 56 4(b)(1) of the Act, 21 U.S.C. section 360bbb-3(b)(1), unless the authorization is terminated or revoked sooner. Performed at [facility], [place] [address],  [place], [place] [address]   Urine Culture     Status: Abnormal   Collection Time: 04/12/19  2:18 PM   Specimen: Urine, Catheterized  Result Value Ref Range Status   Specimen Description   Final    URINE, CATHETERIZED Performed at [facility], [address] [place], [place] [address]    Special Requests   Final    NONE Performed at [facility], [address] [place], [place] [address]    Culture >=100,000 COLONIES/mL ESCHERICHIA COLI (A)  Final   Report Status 04/14/2019 FINAL  Final   Organism ID, Bacteria ESCHERICHIA COLI (A)  Final      Susceptibility   Escherichia coli - MIC*    AMPICILLIN >=32 RESISTANT Resistant     CEFAZOLIN <=4 SENSITIVE Sensitive     CEFTRIAXONE <=1 SENSITIVE Sensitive     CIPROFLOXACIN <=0.25 SENSITIVE Sensitive     GENTAMICIN <=1 SENSITIVE Sensitive     IMIPENEM <=0.25 SENSITIVE Sensitive     NITROFURANTOIN <=16 SENSITIVE Sensitive     TRIMETH/SULFA <=20 SENSITIVE Sensitive     AMPICILLIN/SULBACTAM >=32 RESISTANT Resistant     PIP/TAZO <=4 SENSITIVE Sensitive     Extended ESBL NEGATIVE Sensitive     * >=100,000 COLONIES/mL ESCHERICHIA COLI  MRSA PCR Screening     Status: None   Collection Time: 04/13/19  5:59 AM   Specimen: Nasal Mucosa; Nasopharyngeal  Result Value Ref Range Status   MRSA by PCR NEGATIVE NEGATIVE Final    Comment:        The GeneXpert MRSA Assay (FDA approved for NASAL specimens only), is one component of a comprehensive MRSA colonization surveillance program. It is not intended to diagnose MRSA infection nor to guide or monitor treatment for MRSA infections. Performed at [facility], [address] [place], [place] [address]           IMAGING    No results found.     Indwelling Urinary Catheter continued, requirement due to   Reason to continue Indwelling Urinary Catheter strict Intake/Output monitoring for hemodynamic instability   Central Line/ continued, requirement due  to  Reason to continue [place] of central venous pressure or other hemodynamic parameters and poor IV access   Ventilator continued, requirement due to severe respiratory failure   Ventilator Sedation RASS 0 to -2      ASSESSMENT AND PLAN SYNOPSIS  Severe ACUTE Hypoxic and Hypercapnic Respiratory Failure -continue Full MV support  -Wean Fio2 and PEEP as tolerated -will perform SAT/SBT when respiratory parameters are met   ACUTE KIDNEY INJURY/Renal Failure -appreciate nephrology consult and recommendations -transitioning pt to HD today  -follow chem 7 -follow UO -continue Foley Catheter-assess need -Avoid nephrotoxic agents -Recheck creatinine - slowly improving (2.09 today) -Low albumin (2.6) - replace  -lactic acidosis correcting, due to septic shock - LA 1.4 today   NEUROLOGY - intubated and sedated on fentanyl  - minimal sedation to achieve a RASS goal: 0 - 1 -More responsive today though still not tracking, not following commands   SHOCK-SEPTIC/HYPOVOLEMIC/CARDIOGENIC -shock is improving  -use vasopressors to keep MAP>65 - off pressors today  -follow ABG and LA, discontinue lactic acid monitoring as has corrected -E. coli in urine culture see below -On  ceftriaxone -Volume resuscitation as per parameters  HEME -Hbg 7.1 - transfuse 1 unit PRBCs -Hold subQ heparin -No evidence of active bleeding   CARDIAC ICU monitoring  ID -continue IV abx as prescribed -on ceftriaxone -E. coli in urine.  Resistant to ampicillin and Unasyn -WBC improving (6.2 today)  GI GI PROPHYLAXIS as indicated Episode of vomiting, hold tube feeds for now  NUTRITIONAL STATUS TFs with moderate sliding scale coverage as needed Constipation protocol as indicated  ENDO - will use ICU hypoglycemic\Hyperglycemia protocol if indicated   ELECTROLYTES -follow labs as needed -replace as needed - low phosphorous (1.7)  -pharmacy consultation and  following   DVT/GI PRX ordered On ICU hyperglycemia protocol ASSESS the need for LABS as needed   Critical Care Time devoted to patient care services described in this note is 35 minutes.   Overall, patient is critically ill, prognosis is poor. Will consult Palliative Care to assist with goals of care. Hopefully can wean from the ventilator in the next day or so.   Donnamarie Rossetti, [place] PA-S acted as my scribe 04/16/2019    C. Derrill Kay, MD [facility]    This chart was dictated using voice recognition software/Dragon.  Despite best efforts to proofread, errors can occur which can change the meaning.  Any change was purely unintentional.

## 2019-04-17 DIAGNOSIS — Z7189 Other specified counseling: Secondary | ICD-10-CM

## 2019-04-17 DIAGNOSIS — Z515 Encounter for palliative care: Secondary | ICD-10-CM

## 2019-04-17 DIAGNOSIS — N179 Acute kidney failure, unspecified: Secondary | ICD-10-CM

## 2019-04-17 LAB — RENAL FUNCTION PANEL
Albumin: 2.5 g/dL — ABNORMAL LOW (ref 3.5–5.0)
Albumin: 2.5 g/dL — ABNORMAL LOW (ref 3.5–5.0)
Anion gap: 7 (ref 5–15)
Anion gap: 10 (ref 5–15)
BUN: 26 mg/dL — ABNORMAL HIGH (ref 8–23)
BUN: 28 mg/dL — ABNORMAL HIGH (ref 8–23)
CO2: 27 mmol/L (ref 22–32)
CO2: 25 mmol/L (ref 22–32)
Calcium: 7.9 mg/dL — ABNORMAL LOW (ref 8.9–10.3)
Calcium: 8 mg/dL — ABNORMAL LOW (ref 8.9–10.3)
Chloride: 104 mmol/L (ref 98–111)
Chloride: 102 mmol/L (ref 98–111)
Creatinine, Ser: 2.69 mg/dL — ABNORMAL HIGH (ref 0.61–1.24)
Creatinine, Ser: 2.95 mg/dL — ABNORMAL HIGH (ref 0.61–1.24)
GFR calc Af Amer: 24 mL/min — ABNORMAL LOW (ref >60)
GFR calc Af Amer: 21 mL/min — ABNORMAL LOW (ref >60)
GFR calc non Af Amer: 21 mL/min — ABNORMAL LOW (ref >60)
GFR calc non Af Amer: 18 mL/min — ABNORMAL LOW (ref >60)
Glucose, Bld: 129 mg/dL — ABNORMAL HIGH (ref 70–99)
Glucose, Bld: 149 mg/dL — ABNORMAL HIGH (ref 70–99)
Phosphorus: 2.7 mg/dL (ref 2.5–4.6)
Phosphorus: 2.9 mg/dL (ref 2.5–4.6)
Potassium: 3.8 mmol/L (ref 3.5–5.1)
Potassium: 3.9 mmol/L (ref 3.5–5.1)
Sodium: 138 mmol/L (ref 135–145)
Sodium: 137 mmol/L (ref 135–145)

## 2019-04-17 LAB — CBC WITH DIFFERENTIAL/PLATELET
Abs Immature Granulocytes: 0.05 10*3/uL (ref 0.00–0.07)
Basophils Absolute: 0 10*3/uL (ref 0.0–0.1)
Basophils Relative: 0 %
Eosinophils Absolute: 0.1 10*3/uL (ref 0.0–0.5)
Eosinophils Relative: 1 %
HCT: 24.5 % — ABNORMAL LOW (ref 39.0–52.0)
Hemoglobin: 7.9 g/dL — ABNORMAL LOW (ref 13.0–17.0)
Immature Granulocytes: 1 %
Lymphocytes Relative: 7 %
Lymphs Abs: 0.6 10*3/uL — ABNORMAL LOW (ref 0.7–4.0)
MCH: 27.2 pg (ref 26.0–34.0)
MCHC: 32.2 g/dL (ref 30.0–36.0)
MCV: 84.5 fL (ref 80.0–100.0)
Monocytes Absolute: 0.4 10*3/uL (ref 0.1–1.0)
Monocytes Relative: 5 %
Neutro Abs: 7.4 10*3/uL (ref 1.7–7.7)
Neutrophils Relative %: 86 %
Platelets: 146 10*3/uL — ABNORMAL LOW (ref 150–400)
RBC: 2.9 MIL/uL — ABNORMAL LOW (ref 4.22–5.81)
RDW: 16.9 % — ABNORMAL HIGH (ref 11.5–15.5)
Smear Review: NORMAL
WBC: 8.5 10*3/uL (ref 4.0–10.5)
nRBC: 0 % (ref 0.0–0.2)

## 2019-04-17 LAB — TYPE AND SCREEN
ABO/RH(D): O POS
Antibody Screen: NEGATIVE
Unit division: 0
Unit division: 0

## 2019-04-17 LAB — GLUCOSE, CAPILLARY
Glucose-Capillary: 127 mg/dL — ABNORMAL HIGH (ref 70–99)
Glucose-Capillary: 105 mg/dL — ABNORMAL HIGH (ref 70–99)
Glucose-Capillary: 149 mg/dL — ABNORMAL HIGH (ref 70–99)
Glucose-Capillary: 179 mg/dL — ABNORMAL HIGH (ref 70–99)
Glucose-Capillary: 216 mg/dL — ABNORMAL HIGH (ref 70–99)

## 2019-04-17 LAB — BPAM RBC
Blood Product Expiration Date: 202011122359
Blood Product Expiration Date: 202012032359
ISSUE DATE / TIME: 202011091521
ISSUE DATE / TIME: 202011101213
Unit Type and Rh: 5100
Unit Type and Rh: 5100

## 2019-04-17 LAB — CULTURE, BLOOD (ROUTINE X 2)
Culture: NO GROWTH
Special Requests: ADEQUATE

## 2019-04-17 LAB — MAGNESIUM: Magnesium: 2.2 mg/dL (ref 1.7–2.4)

## 2019-04-17 MED ORDER — HEPARIN SODIUM (PORCINE) 5000 UNIT/ML IJ SOLN
5000.0000 [IU] | Freq: Three times a day (TID) | INTRAMUSCULAR | Status: DC
  Administered 2019-04-17 – 2019-04-18 (×2): 5000 [IU] via SUBCUTANEOUS
  Filled 2019-04-17 (×2): qty 1

## 2019-04-17 MED ORDER — PANTOPRAZOLE SODIUM 40 MG PO TBEC: 40.0000 mg | DELAYED_RELEASE_TABLET | Freq: Every day | ORAL | Status: DC

## 2019-04-17 NOTE — Progress Notes (Signed)
[facility]  ROUNDING NOTE   Subjective:   CRRT stopped yesterday evening. Hemodynamically stable. UOP 163m.   Tmax 100.8   Objective:  Vital signs in last 24 hours:  Temp:  [99.5 F (37.5 C)-100.8 F (38.2 C)] 99.7 F (37.6 C) (11/11 0800) Pulse Rate:  [73-85] 79 (11/11 0800) Resp:  [11-22] 19 (11/11 0800) BP: (100-141)/(49-120) 135/49 (11/11 0800) SpO2:  [93 %-98 %] 97 % (11/11 0800) FiO2 (%):  [30 %] 30 % (11/11 0800) Weight:  [113.8 kg] 113.8 kg (11/11 0403)  Weight change: 3.6 kg Filed Weights   04/14/19 0500 04/16/19 0500 04/17/19 0403  Weight: 110.4 kg 110.2 kg 113.8 kg    Intake/Output: I/O last 3 completed shifts: In: 2922.8 [I.V.:1110.8; Blood:350; Other:151; NG/GT:703.3; IV Piggyback:607.7] Out: 2464 [Urine:275; Emesis/NG output:10; Other:2179]   Intake/Output this shift:  Total I/O In: -  Out: 40 [Urine:40]  Physical Exam: General: Critically ill   Head: ETT  Eyes: Anicteric  Neck:   trachea midline  Lungs:  Bilateral rhonchi, PRVC FiO2 30%  Heart: regular  Abdomen:   bowel sounds present  Extremities: Trace peripheral edema.  Neurologic: Intubated, sedated  Skin: No lesions  Access:  Right femoral temporary hemodialysis catheter 116/1   Basic Metabolic Panel: Recent Labs  Lab 04/14/19 0134 04/14/19 0525  04/15/19 0553  04/16/19 0237 04/16/19 0551 04/16/19 1045 04/16/19 1454 04/17/19 0617  NA 137 138   < > 136   < > 137 137 137 137 138  K 5.8* 3.2*   < > 3.6   < > 3.7 3.8 3.9 4.4 3.8  CL 102 100   < > 96*   < > 102 103 102 104 104  CO2 <7* 20*   < > 28   < > 27 28 27 26 27   GLUCOSE 288* 196*   < > 205*   < > 259* 268* 258* 198* 129*  BUN 64* 54*   < > 47*   < > 27* 25* 22 20 26*  CREATININE 5.95* 4.13*   < > 3.63*   < > 2.29* 2.09* 2.00* 1.82* 2.69*  CALCIUM 7.2* 7.0*   < > 6.9*   < > 7.2* 7.4* 7.4* 7.4* 7.9*  MG 1.8 1.8  --  2.1  --   --  2.3  --   --  2.2  PHOS 8.4* 5.0*   < > 4.2   < > 1.7* 1.5* 1.7* 2.2* 2.7   <  > = values in this interval not displayed.    Liver Function Tests: Recent Labs  Lab 04/12/19 1154 04/13/19 0355  04/13/19 1323  04/14/19 0525  04/16/19 0237 04/16/19 0551 04/16/19 1045 04/16/19 1454 04/17/19 0617  AST 87* 89*  --  87*  --  159*  --   --   --   --   --   --   ALT 48* 56*  --  68*  --  108*  --   --   --   --   --   --   ALKPHOS 68 60  --  59  --  54  --   --   --   --   --   --   BILITOT 0.7 0.7  --  1.0  --  0.7  --   --   --   --   --   --   PROT 7.1 6.1*  --  6.0*  --  5.5*  --   --   --   --   --   --  ALBUMIN 3.1* 2.6*   < > 2.7*   < > 2.7*  2.8*   < > 2.6* 2.6* 2.7* 2.5* 2.5*   < > = values in this interval not displayed.   No results for input(s): LIPASE, AMYLASE in the last 168 hours. Recent Labs  Lab 04/13/19 1046  AMMONIA 269*    CBC: Recent Labs  Lab 04/13/19 0355  04/13/19 2245 04/14/19 0134 04/15/19 0553 04/15/19 1814 04/16/19 0551 04/17/19 0617  WBC 8.4  --  13.4* 13.6* 6.8  --  6.2 8.5  NEUTROABS 6.7  --   --  10.9* 5.2  --  4.9 7.4  HGB 9.3*   < > 7.7* 7.8* 6.7* 8.0* 7.1* 7.9*  HCT 30.6*  --  24.2* 24.1* 19.2* 24.4* 22.4* 24.5*  MCV 91.3  --  88.0 86.7 80.0  --  84.8 84.5  PLT 316  --  248 234 161  --  141* 146*   < > = values in this interval not displayed.    Cardiac Enzymes: No results for input(s): CKTOTAL, CKMB, CKMBINDEX, TROPONINI in the last 168 hours.  BNP: Invalid input(s): POCBNP  CBG: Recent Labs  Lab 04/16/19 1530 04/16/19 1912 04/16/19 2347 04/17/19 0352 04/17/19 0736  GLUCAP 184* 157* 149* 127* 105*    Microbiology: Results for orders placed or performed during the hospital encounter of 04/12/19  Blood culture (routine x 2)     Status: None (Preliminary result)   Collection Time: 04/12/19 11:58 AM   Specimen: BLOOD  Result Value Ref Range Status   Specimen Description BLOOD RIGHT ANTECUBITAL  Final   Special Requests   Final    BOTTLES DRAWN AEROBIC AND ANAEROBIC Blood Culture adequate volume    Culture   Final    NO GROWTH 4 DAYS Performed at [facility], [address] [place], [place] [address]    Report Status PENDING  Incomplete  SARS CORONAVIRUS 2 (TAT 6-24 HRS) Nasopharyngeal Nasopharyngeal Swab     Status: None   Collection Time: 04/12/19 11:58 AM   Specimen: Nasopharyngeal Swab  Result Value Ref Range Status   SARS Coronavirus 2 NEGATIVE NEGATIVE Final    Comment: (NOTE) SARS-CoV-2 target nucleic acids are NOT DETECTED. The SARS-CoV-2 RNA is generally detectable in upper and lower respiratory specimens during the acute phase of infection. Negative results do not preclude SARS-CoV-2 infection, do not rule out co-infections with other pathogens, and should not be used as the sole basis for treatment or other patient management decisions. Negative results must be combined with clinical observations, patient history, and epidemiological information. The expected result is Negative. Fact Sheet for Patients: SugarRoll.be Fact Sheet for Healthcare Providers: https://www.woods-mathews.com/ This test is not yet approved or cleared by the [place] FDA and  has been authorized for detection and/or diagnosis of SARS-CoV-2 by FDA under an Emergency Use Authorization (EUA). This EUA will remain  in effect (meaning this test can be used) for the duration of the COVID-19 declaration under Section 56 4(b)(1) of the Act, 21 U.S.C. section 360bbb-3(b)(1), unless the authorization is terminated or revoked sooner. Performed at [facility], [place] [address] [place], [place] [address]   Urine Culture     Status: Abnormal   Collection Time: 04/12/19  2:18 PM   Specimen: Urine, Catheterized  Result Value Ref Range Status   Specimen Description   Final    URINE, CATHETERIZED Performed at [facility], [address], [place], [place] [address]  Special Requests   Final    NONE Performed at [facility], [address] [place], [place] [address]    Culture >=100,000 COLONIES/mL ESCHERICHIA COLI (A)  Final   Report Status 04/14/2019 FINAL  Final   Organism ID, Bacteria ESCHERICHIA COLI (A)  Final      Susceptibility   Escherichia coli - MIC*    AMPICILLIN >=32 RESISTANT Resistant     CEFAZOLIN <=4 SENSITIVE Sensitive     CEFTRIAXONE <=1 SENSITIVE Sensitive     CIPROFLOXACIN <=0.25 SENSITIVE Sensitive     GENTAMICIN <=1 SENSITIVE Sensitive     IMIPENEM <=0.25 SENSITIVE Sensitive     NITROFURANTOIN <=16 SENSITIVE Sensitive     TRIMETH/SULFA <=20 SENSITIVE Sensitive     AMPICILLIN/SULBACTAM >=32 RESISTANT Resistant     PIP/TAZO <=4 SENSITIVE Sensitive     Extended ESBL NEGATIVE Sensitive     * >=100,000 COLONIES/mL ESCHERICHIA COLI  MRSA PCR Screening     Status: None   Collection Time: 04/13/19  5:59 AM   Specimen: Nasal Mucosa; Nasopharyngeal  Result Value Ref Range Status   MRSA by PCR NEGATIVE NEGATIVE Final    Comment:        The GeneXpert MRSA Assay (FDA approved for NASAL specimens only), is one component of a comprehensive MRSA colonization surveillance program. It is not intended to diagnose MRSA infection nor to guide or monitor treatment for MRSA infections. Performed at [facility], [address] [place], [place] [address]     Coagulation Studies: No results for input(s): LABPROT, INR in the last 72 hours.  Urinalysis: No results for input(s): COLORURINE, LABSPEC, PHURINE, GLUCOSEU, HGBUR, BILIRUBINUR, KETONESUR, PROTEINUR, UROBILINOGEN, NITRITE, LEUKOCYTESUR in the last 72 hours.  Invalid input(s): APPERANCEUR    Imaging: Dg Chest Port 1 View  Result Date: 04/16/2019 CLINICAL DATA:  Aspiration. EXAM: PORTABLE CHEST 1 VIEW COMPARISON:  04/14/2019. FINDINGS: Endotracheal tube and NG tube noted in stable position. Heart size normal. Low lung volumes with bibasilar atelectasis. Left base infiltrate. No pleural effusion or  pneumothorax. Carotid vascular calcification. IMPRESSION: 1.  Endotracheal tube and NG tube in stable position. 2. Low lung volumes with bibasilar atelectasis. Mild left base infiltrate consistent with pneumonia. 3.  Carotid vascular disease. Electronically Signed   By: Marcello Moores  Register   On: 04/16/2019 14:48     Medications:   .  prismasol BGK 4/2.5 1,500 mL/hr at 04/16/19 1453  .  prismasol BGK 4/2.5 500 mL/hr at 04/16/19 1453  . cefTRIAXone (ROCEPHIN)  IV Stopped (04/16/19 1731)  . fentaNYL infusion INTRAVENOUS 350 mcg/hr (04/17/19 0844)  . norepinephrine (LEVOPHED) Adult infusion Stopped (04/16/19 0544)  . prismasol BGK 4/2.5 2,000 mL/hr at 04/16/19 1743   . B-complex with vitamin C  1 tablet Per Tube Daily  . chlorhexidine gluconate (MEDLINE KIT)  15 mL Mouth Rinse BID  . Chlorhexidine Gluconate Cloth  6 each Topical Daily  . clopidogrel  75 mg Oral Daily  . feeding supplement (PRO-STAT SUGAR FREE 64)  60 mL Per Tube TID  . feeding supplement (VITAL 1.5 CAL)  1,000 mL Per Tube Q24H  . insulin aspart  0-15 Units Subcutaneous Q4H  . insulin aspart  3 Units Subcutaneous Q4H  . latanoprost  1 drop Both Eyes QHS  . mouth rinse  15 mL Mouth Rinse 10 times per day  . pantoprazole (PROTONIX) IV  40 mg Intravenous Q24H   acetaminophen **OR** acetaminophen, heparin, ipratropium-albuterol, midazolam, sodium chloride flush  Assessment/ Plan:  85  y.o.black male with congestive heart failure, coronary artery disease, diabetes mellitus type 2, hyperlipidemia, hypertension, myocardial infarction, stroke, who was admitted to [facility] on 04/12/2019 for nausea, diarrhea, low grade fever.   1.  Acute kidney injury: baseline creatinine of 1.11, GFR of >60 on 04/05/2019 2.  hyperkalemia. 3.  Metabolic acidosis. 4.  Acute respiratory failure. 5.  Hypotension. 6. Anemia of renal failure  Plan: requiring renal replacement therapy. Transition to intermittent hemodialysis when more stable Continues to  wean /maintain off vasopressors.  - Weaned off CRRT.  - Monitor daily for dialysis need. Suspect patient will need renal replacement tomorrow.    LOS: 5 Philip Garrett 11/11/202010:26 AM

## 2019-04-17 NOTE — Progress Notes (Addendum)
CRITICAL CARE NOTE  CC  follow up acute renal failure and septic shock and lactic acidosis  SUBJECTIVE Patient remains critically ill Prognosis is guarded Remains intubated on ventilator at FiO2 30%. Currently on fentanyl drip for discomfort. CRRT has been stopped, plan for HD tomorrow.    BP 130/90   Pulse 90   Temp 99.9 F (37.7 C)   Resp 19   Ht 6' 4"  (1.93 m)   Wt 113.8 kg   SpO2 92%   BMI 30.54 kg/m    I/O last 3 completed shifts: In: 2922.8 [I.V.:1110.8; Blood:350; Other:151; NG/GT:703.3; IV Piggyback:607.7] Out: 2464 [Urine:275; Emesis/NG output:10; Other:2179] Total I/O In: -  Out: 65 [Urine:65]  SpO2: 92 % FiO2 (%): 30 %   SIGNIFICANT EVENTS 11/9: Pt remains on ventilator today at FiO2 of 30%. He is currently sedated on fentanyl and is on levophed. He is unresponsive. Pt remains on CRRT. 11/10: Off pressors. Plan to switch from CRRT to HD today. Responsive to verbal stimuli today. Opens eyes to command. Moves extremities, but not purposefully.  Does not track.  On fentanyl drip for ventilator discomfort, low-dose.  11/11: Similar status as yesterday - responsive to verbal stimuli, opens eyes to commands. Moves extremities non-purposefully. Creatinine up to 2.69 today.  REVIEW OF SYSTEMS  PATIENT IS UNABLE TO PROVIDE COMPLETE REVIEW OF SYSTEMS DUE TO SEVERE CRITICAL ILLNESS   PHYSICAL EXAMINATION:  GENERAL: intubated, responsive to verbal stimuli, does not track HEAD: Normocephalic, atraumatic.  EYES: Pupils equal, round, nonreactive to light.  No scleral icterus.  MOUTH: ET and OG tube in place NECK: Supple. Trachea midline.  PULMONARY: Rhonchi bilaterally  CARDIOVASCULAR: S1 and S2. Regular rate and rhythm. No murmurs, rubs, or gallops.  GASTROINTESTINAL: Soft, non-distended. No masses. Positive bowel sounds.  MUSCULOSKELETAL: Edematous extremities (anasarca 2+). Missing first two toes on left foot NEUROLOGIC: Responsive to verbal stimuli. Opens eyes  to command. Non-purposeful movement of extremities  SKIN:intact,warm,dry  MEDICATIONS: I have reviewed all medications and confirmed regimen as documented   CULTURE RESULTS   Recent Results (from the past 240 hour(s))  Blood culture (routine x 2)     Status: None   Collection Time: 04/12/19 11:58 AM   Specimen: BLOOD  Result Value Ref Range Status   Specimen Description BLOOD RIGHT ANTECUBITAL  Final   Special Requests   Final    BOTTLES DRAWN AEROBIC AND ANAEROBIC Blood Culture adequate volume   Culture   Final    NO GROWTH 5 DAYS Performed at [facility], [address] [place], [place] [address]    Report Status 04/17/2019 FINAL  Final  SARS CORONAVIRUS 2 (TAT 6-24 HRS) Nasopharyngeal Nasopharyngeal Swab     Status: None   Collection Time: 04/12/19 11:58 AM   Specimen: Nasopharyngeal Swab  Result Value Ref Range Status   SARS Coronavirus 2 NEGATIVE NEGATIVE Final    Comment: (NOTE) SARS-CoV-2 target nucleic acids are NOT DETECTED. The SARS-CoV-2 RNA is generally detectable in upper and lower respiratory specimens during the acute phase of infection. Negative results do not preclude SARS-CoV-2 infection, do not rule out co-infections with other pathogens, and should not be used as the sole basis for treatment or other patient management decisions. Negative results must be combined with clinical observations, patient history, and epidemiological information. The expected result is Negative. Fact Sheet for Patients: SugarRoll.be Fact Sheet for Healthcare Providers: https://www.woods-mathews.com/ This test is not yet approved or cleared by the [place] FDA and  has been authorized for  detection and/or diagnosis of SARS-CoV-2 by FDA under an Emergency Use Authorization (EUA). This EUA will remain  in effect (meaning this test can be used) for the duration of the COVID-19 declaration under Section 56 4(b)(1) of the  Act, 21 U.S.C. section 360bbb-3(b)(1), unless the authorization is terminated or revoked sooner. Performed at [facility], [place] [address], [place], [place] [address]   Urine Culture     Status: Abnormal   Collection Time: 04/12/19  2:18 PM   Specimen: Urine, Catheterized  Result Value Ref Range Status   Specimen Description   Final    URINE, CATHETERIZED Performed at [facility], [address] [place], [place] [address]    Special Requests   Final    NONE Performed at [facility], [address] [place], [place] [address]    Culture >=100,000 COLONIES/mL ESCHERICHIA COLI (A)  Final   Report Status 04/14/2019 FINAL  Final   Organism ID, Bacteria ESCHERICHIA COLI (A)  Final      Susceptibility   Escherichia coli - MIC*    AMPICILLIN >=32 RESISTANT Resistant     CEFAZOLIN <=4 SENSITIVE Sensitive     CEFTRIAXONE <=1 SENSITIVE Sensitive     CIPROFLOXACIN <=0.25 SENSITIVE Sensitive     GENTAMICIN <=1 SENSITIVE Sensitive     IMIPENEM <=0.25 SENSITIVE Sensitive     NITROFURANTOIN <=16 SENSITIVE Sensitive     TRIMETH/SULFA <=20 SENSITIVE Sensitive     AMPICILLIN/SULBACTAM >=32 RESISTANT Resistant     PIP/TAZO <=4 SENSITIVE Sensitive     Extended ESBL NEGATIVE Sensitive     * >=100,000 COLONIES/mL ESCHERICHIA COLI  MRSA PCR Screening     Status: None   Collection Time: 04/13/19  5:59 AM   Specimen: Nasal Mucosa; Nasopharyngeal  Result Value Ref Range Status   MRSA by PCR NEGATIVE NEGATIVE Final    Comment:        The GeneXpert MRSA Assay (FDA approved for NASAL specimens only), is one component of a comprehensive MRSA colonization surveillance program. It is not intended to diagnose MRSA infection nor to guide or monitor treatment for MRSA infections. Performed at [facility], [address], [place], [place] [address]           IMAGING    Dg Chest Port 1 View  Result Date: 04/16/2019 CLINICAL DATA:  Aspiration. EXAM:  PORTABLE CHEST 1 VIEW COMPARISON:  04/14/2019. FINDINGS: Endotracheal tube and NG tube noted in stable position. Heart size normal. Low lung volumes with bibasilar atelectasis. Left base infiltrate. No pleural effusion or pneumothorax. Carotid vascular calcification. IMPRESSION: 1.  Endotracheal tube and NG tube in stable position. 2. Low lung volumes with bibasilar atelectasis. Mild left base infiltrate consistent with pneumonia. 3.  Carotid vascular disease. Electronically Signed   By: Marcello Moores  Register   On: 04/16/2019 14:48       Indwelling Urinary Catheter continued, requirement due to   Reason to continue Indwelling Urinary Catheter strict Intake/Output monitoring for hemodynamic instability   Central Line/ continued, requirement due to  Reason to continue [place] of central venous pressure or other hemodynamic parameters and poor IV access   Ventilator continued, requirement due to severe respiratory failure   Ventilator Sedation RASS 0 to -2      ASSESSMENT AND PLAN SYNOPSIS   ACUTE Hypoxic and Hypercapnic Respiratory Failure -continue Full MV support  -Wean Fio2 and PEEP as tolerated -will perform SAT/SBT when respiratory parameters are met -wean fentanyl, attempt SBT, excessive  secretions precluded proper SBT today   ACUTE KIDNEY INJURY/Renal Failure -appreciate nephrology consult and recommendations -transitioning pt to HD tomorrow -follow chem 7  -follow UO -continue Foley Catheter-assess need -Avoid nephrotoxic agents -Recheck creatinine- spiked today (2.69) -albumin low (2.6) - replace   NEUROLOGY - intubated and sedatedon minimal fentanyl - minimal sedation to achieve a RASS goal: 0 - 1 -More responsive today though still not tracking, not following commands   SHOCK-SEPTIC/HYPOVOLEMIC/CARDIOGENIC -shock physiology resolved  -use vasopressors to keep MAP>65- off pressors today  -follow ABG and LA, discontinue lactic acid monitoring  as has corrected -E. coli in urine culture see below -On ceftriaxone -Volume resuscitation as per parameters  HEME -Hbg stable today at 7.9 -Restart subQ heparin  -No evidence of active bleeding  CARDIAC ICU monitoring  ID -continue IV abx as prescribed-on ceftriaxone -E. coli in urine. Resistant to ampicillin and Unasyn -WBC WNL -Copious ETT secretions, sputum culture  GI GI PROPHYLAXIS as indicated Episode of vomiting, hold tube feeds for now  NUTRITIONAL STATUS TFs with moderate sliding scalecoverageasneeded Constipation protocol as indicated  ENDO - will use ICU hypoglycemic\Hyperglycemia protocol if indicated   ELECTROLYTES -follow labs as needed -replace as needed -pharmacy consultation and following   DVT/GI PRX ordered On ICU hyperglycemia protocol ASSESS the need for LABS as needed   Critical Care Time devoted to patient care services described in this note is 37 minutes.   Overall, patient is critically ill, prognosis is poor.Will try to wean from ventilator within next few days.  Palliative Care following, appreciate input and assistance with determining goals of care possible transition to comfort care.  Multidisciplinary rounds were performed today with ICU team.  Donnamarie Rossetti. PA-S acted as my scribe   C. Derrill Kay, MD [facility]   This chart was dictated using voice recognition software/Dragon.  Despite best efforts to proofread, errors can occur which can change the meaning.  Any change was purely unintentional.

## 2019-04-17 NOTE — Consult Note (Signed)
[facility] for Electrolyte Monitoring and Replacement   Recent Labs: Potassium (mmol/L)  Date Value  04/17/2019 3.9  04/14/2014 4.5   Magnesium (mg/dL)  Date Value  04/17/2019 2.2   Calcium (mg/dL)  Date Value  04/17/2019 8.0 (L)   Calcium, Total (mg/dL)  Date Value  04/14/2014 9.1   Albumin (g/dL)  Date Value  04/17/2019 2.5 (L)  04/14/2014 3.9   Phosphorus (mg/dL)  Date Value  04/17/2019 2.9   Sodium (mmol/L)  Date Value  04/17/2019 137  04/14/2014 138   Corrected Ca: 9.2 mg/dL  Assessment: 85 y.o.black male with congestive heart failure, coronary artery disease, diabetes mellitus type 2, hyperlipidemia, hypertension, myocardial infarction, stroke, who was admitted to [facility] on11/6/2020for nausea, diarrhea,low grade fever.  Goal of Therapy:  Potassium 4.0 - 5.1 mmol/L Magnesium 2.0 - 2.4 mg/dL Other electrolytes WNL  Plan:   Potassium is borderline at goal. Will monitor.  No electrolyte replacement warranted at this time.  Renal function panel and magnesium with AM labs.  Raiford Simmonds ,PharmD Candidate 04/17/2019 12:14 PM

## 2019-04-17 NOTE — Consult Note (Signed)
Consultation Note Date: 04/17/2019   Patient Name: Philip Garrett  DOB: 1931/11/15  MRN: [id number]  Age / Sex: 85 y.o., male  PCP: Jodi Marble, MD Referring Physician: Tyler Pita, MD  Reason for Consultation: Establishing goals of care  HPI/Patient Profile: Philip Garrett is a 85 y.o. male with history h/o HTN, hyperlipidemia, CVA on aspirin/Plavix, CAD/MI, CHF, IDDM sent from SNF due to concern for decline, weakness, and sluggishness.  Clinical Assessment and Goals of Care: Patient is in bed on ventilator. He is unable to follow commands. Medications in place for discomfort as he appears uncomfortable. Spoke with his niece Philip Garrett. Philip Garrett states Philip Garrett has no children and has never been married. She states he has other nieces and nephews but they do not have a relationship with him. She states she has access to his bank account to help manage his money and his affairs, but she is not his POA/HPOA. She states he does not have an HPOA.   Philip Garrett has lived in assisted living for the past 8-9 years. He walked without assistance until around 6 months ago. He began using a walker, but had a lot of falls, and began using a wheelchair. She states his health has been declining and he has had several hospitalizations. Prior to admission, he had progressed to a place of not being able to get out of bed himself, or with staff assistance.    We discussed his diagnosis, prognosis, GOC, EOL wishes disposition and options.  A detailed discussion was had today regarding advanced directives.  Concepts specific to code status, artifical feeding and hydration, IV antibiotics and rehospitalization were discussed.  The difference between an aggressive medical intervention path and a comfort care path was discussed.  Values and goals of care important to patient and family were attempted to be elicited.  Discussed  limitations of medical interventions to prolong quality of life in some situations and discussed the concept of human mortality.  She is very concerned for his QOL and does not want him to suffer. She states he would not want to live in a nursing home, or a situation where he would need assistance with ADL's. She states she is out of town, but will return Friday and come to the hospital. She would like to discuss shift to comfort care at that time.     SUMMARY OF RECOMMENDATIONS   Continue current treatment. Niece to come Friday when she comes back into town to discuss transition to comfort care.     Prognosis:   Poor      Primary Diagnoses: Present on Admission: . Acute renal failure (ARF) ([place]) . Essential hypertension . Type 2 diabetes mellitus with other specified complication ([place]) . Transaminitis . Pressure injury of buttock, stage 1   I have reviewed the medical record, interviewed the patient and family, and examined the patient. The following aspects are pertinent.  Past Medical History:  Diagnosis Date  . Cervical stenosis of spinal canal   . CHF (congestive  heart failure) ([place])   . Coronary artery disease   . Diabetes mellitus without complication ([place])   . Hyperlipidemia   . Hypertension   . Myocardial infarction ([place])   . Stroke [facility])    TIA   Social History   Socioeconomic History  . Marital status: Single    Spouse name: Not on file  . Number of children: Not on file  . Years of education: Not on file  . Highest education level: Not on file  Occupational History  . Not on file  Social Needs  . Financial resource strain: Not on file  . Food insecurity    Worry: Not on file    Inability: Not on file  . Transportation needs    Medical: Not on file    Non-medical: Not on file  Tobacco Use  . Smoking status: Never Smoker  . Smokeless tobacco: Current User    Types: Snuff, Chew  Substance and Sexual Activity  . Alcohol use: No  . Drug use: No   . Sexual activity: Not on file  Lifestyle  . Physical activity    Days per week: Not on file    Minutes per session: Not on file  . Stress: Not on file  Relationships  . Social Herbalist on phone: Not on file    Gets together: Not on file    Attends religious service: Not on file    Active member of club or organization: Not on file    Attends meetings of clubs or organizations: Not on file    Relationship status: Not on file  Other Topics Concern  . Not on file  Social History Narrative  . Not on file   Family History  Problem Relation Age of Onset  . Diabetes Other    Scheduled Meds: . B-complex with vitamin C  1 tablet Per Tube Daily  . chlorhexidine gluconate (MEDLINE KIT)  15 mL Mouth Rinse BID  . Chlorhexidine Gluconate Cloth  6 each Topical Daily  . clopidogrel  75 mg Oral Daily  . feeding supplement (PRO-STAT SUGAR FREE 64)  60 mL Per Tube TID  . feeding supplement (VITAL 1.5 CAL)  1,000 mL Per Tube Q24H  . insulin aspart  0-15 Units Subcutaneous Q4H  . insulin aspart  3 Units Subcutaneous Q4H  . latanoprost  1 drop Both Eyes QHS  . mouth rinse  15 mL Mouth Rinse 10 times per day  . pantoprazole (PROTONIX) IV  40 mg Intravenous Q24H   Continuous Infusions: .  prismasol BGK 4/2.5 1,500 mL/hr at 04/16/19 1453  .  prismasol BGK 4/2.5 500 mL/hr at 04/16/19 1453  . cefTRIAXone (ROCEPHIN)  IV Stopped (04/16/19 1731)  . fentaNYL infusion INTRAVENOUS 350 mcg/hr (04/17/19 0844)  . norepinephrine (LEVOPHED) Adult infusion Stopped (04/16/19 0544)  . prismasol BGK 4/2.5 2,000 mL/hr at 04/16/19 1743   PRN Meds:.acetaminophen **OR** acetaminophen, heparin, ipratropium-albuterol, midazolam, sodium chloride flush Medications Prior to Admission:  Prior to Admission medications   Medication Sig Start Date End Date Taking? Authorizing Provider  acetaminophen (TYLENOL) 500 MG tablet Take 1,000 mg by mouth every 4 (four) hours as needed for mild pain or moderate pain.     Yes [provider]  amLODipine (NORVASC) 5 MG tablet Take 5 mg by mouth daily.    Yes [provider]  atorvastatin (LIPITOR) 20 MG tablet Take 20 mg by mouth at bedtime.    Yes [provider]  carvedilol (COREG)  25 MG tablet Take 25 mg by mouth 2 (two) times daily with a meal.    Yes [provider]  clopidogrel (PLAVIX) 75 MG tablet Take 75 mg by mouth daily.   Yes [provider]  fluticasone (FLONASE) 50 MCG/ACT nasal spray Place 1 spray into both nostrils daily.   Yes [provider]  insulin aspart (NOVOLOG) 100 UNIT/ML injection Inject 0-10 Units into the skin See admin instructions. Inject under the skin before meals according to sliding scale:  <150: 0u 150-200: 2u 201-250: 4u 251-300: 6u 301-350: 8u 351-400: 10u   Yes [provider]  Insulin Detemir (LEVEMIR FLEXTOUCH) 100 UNIT/ML Pen Inject 10 Units into the skin daily.    Yes [provider]  latanoprost (XALATAN) 0.005 % ophthalmic solution Place 1 drop into both eyes at bedtime.   Yes [provider]  linagliptin (TRADJENTA) 5 MG TABS tablet Take 5 mg by mouth daily.   Yes [provider]  loratadine (CLARITIN) 10 MG tablet Take 10 mg by mouth daily as needed for allergies.    Yes [provider]  metFORMIN (GLUCOPHAGE) 1000 MG tablet Take 1,000 mg by mouth 2 (two) times daily. 04/01/19  Yes [provider]  montelukast (SINGULAIR) 10 MG tablet Take 10 mg by mouth daily.    Yes [provider]  ramipril (ALTACE) 10 MG capsule Take 10 mg by mouth daily.   Yes [provider]   No Known Allergies Review of Systems  Unable to perform ROS   Physical Exam Constitutional:      Comments: On ventilator.   Neurological:     Comments: Opens eyes spontaneously.      Vital Signs: BP 130/90   Pulse 90   Temp 99.9 F (37.7 C)   Resp 19   Ht '6\' 4"'  (1.93 m)   Wt 113.8 kg   SpO2 92%   BMI 30.54  kg/m  Pain Scale: CPOT   Pain Score: 0-No pain   SpO2: SpO2: 92 % O2 Device:SpO2: 92 % O2 Flow Rate: .   IO: Intake/output summary:   Intake/Output Summary (Last 24 hours) at 04/17/2019 1324 Last data filed at 04/17/2019 1100 Gross per 24 hour  Intake 1386.63 ml  Output 808 ml  Net 578.63 ml    LBM: Last BM Date: 04/17/19 Baseline Weight: Weight: 127 kg Most recent weight: Weight: 113.8 kg     Palliative Assessment/Data: On vent     Time In: 12:20 Time Out: 1:30 Time Total: 70 min Greater than 50%  of this time was spent counseling and coordinating care related to the above assessment and plan.  Signed by: Asencion Gowda, NP   Please contact Palliative Medicine Team phone at [phone] for questions and concerns.  For individual provider: See Shea Evans

## 2019-04-18 DIAGNOSIS — F0150 Vascular dementia without behavioral disturbance: Secondary | ICD-10-CM

## 2019-04-18 LAB — CBC
HCT: 22.9 % — ABNORMAL LOW (ref 39.0–52.0)
Hemoglobin: 7.3 g/dL — ABNORMAL LOW (ref 13.0–17.0)
MCH: 27.3 pg (ref 26.0–34.0)
MCHC: 31.9 g/dL (ref 30.0–36.0)
MCV: 85.8 fL (ref 80.0–100.0)
Platelets: 208 10*3/uL (ref 150–400)
RBC: 2.67 MIL/uL — ABNORMAL LOW (ref 4.22–5.81)
RDW: 16.8 % — ABNORMAL HIGH (ref 11.5–15.5)
WBC: 9.3 10*3/uL (ref 4.0–10.5)
nRBC: 0 % (ref 0.0–0.2)

## 2019-04-18 LAB — RENAL FUNCTION PANEL
Albumin: 2.3 g/dL — ABNORMAL LOW (ref 3.5–5.0)
Anion gap: 10 (ref 5–15)
BUN: 44 mg/dL — ABNORMAL HIGH (ref 8–23)
CO2: 22 mmol/L (ref 22–32)
Calcium: 8 mg/dL — ABNORMAL LOW (ref 8.9–10.3)
Chloride: 106 mmol/L (ref 98–111)
Creatinine, Ser: 3.84 mg/dL — ABNORMAL HIGH (ref 0.61–1.24)
GFR calc Af Amer: 16 mL/min — ABNORMAL LOW (ref >60)
GFR calc non Af Amer: 13 mL/min — ABNORMAL LOW (ref >60)
Glucose, Bld: 201 mg/dL — ABNORMAL HIGH (ref 70–99)
Phosphorus: 3.7 mg/dL (ref 2.5–4.6)
Potassium: 3.9 mmol/L (ref 3.5–5.1)
Sodium: 138 mmol/L (ref 135–145)

## 2019-04-18 LAB — MAGNESIUM: Magnesium: 2.2 mg/dL (ref 1.7–2.4)

## 2019-04-18 LAB — GLUCOSE, CAPILLARY
Glucose-Capillary: 182 mg/dL — ABNORMAL HIGH (ref 70–99)
Glucose-Capillary: 190 mg/dL — ABNORMAL HIGH (ref 70–99)
Glucose-Capillary: 199 mg/dL — ABNORMAL HIGH (ref 70–99)

## 2019-04-18 MED ORDER — HYDROMORPHONE HCL 1 MG/ML IJ SOLN: 0.5000 mg | INTRAMUSCULAR | Status: DC | PRN

## 2019-04-18 MED ORDER — SODIUM CHLORIDE 0.9% FLUSH
3.0000 mL | Freq: Two times a day (BID) | INTRAVENOUS | Status: DC
  Administered 2019-04-18 – 2019-04-19 (×3): 3 mL via INTRAVENOUS

## 2019-04-18 MED ORDER — LORAZEPAM 2 MG/ML IJ SOLN: 0.5000 mg | INTRAMUSCULAR | Status: DC | PRN

## 2019-04-18 MED ORDER — HALOPERIDOL 0.5 MG PO TABS
0.5000 mg | ORAL_TABLET | ORAL | Status: DC | PRN
  Filled 2019-04-18: qty 1

## 2019-04-18 MED ORDER — POLYVINYL ALCOHOL 1.4 % OP SOLN
1.0000 [drp] | Freq: Four times a day (QID) | OPHTHALMIC | Status: DC | PRN
  Filled 2019-04-18: qty 15

## 2019-04-18 MED ORDER — ONDANSETRON 4 MG PO TBDP
4.0000 mg | ORAL_TABLET | Freq: Four times a day (QID) | ORAL | Status: DC | PRN
  Filled 2019-04-18: qty 1

## 2019-04-18 MED ORDER — HALOPERIDOL LACTATE 2 MG/ML PO CONC
0.5000 mg | ORAL | Status: DC | PRN
  Filled 2019-04-18: qty 0.3

## 2019-04-18 MED ORDER — GLYCOPYRROLATE 0.2 MG/ML IJ SOLN
0.3000 mg | INTRAMUSCULAR | Status: DC | PRN
  Filled 2019-04-18: qty 1.5

## 2019-04-18 MED ORDER — MIDAZOLAM HCL 2 MG/2ML IJ SOLN: 0.5000 mg | INTRAMUSCULAR | Status: DC | PRN

## 2019-04-18 MED ORDER — ACETAMINOPHEN 650 MG RE SUPP: 650.0000 mg | Freq: Four times a day (QID) | RECTAL | Status: DC | PRN

## 2019-04-18 MED ORDER — HALOPERIDOL LACTATE 5 MG/ML IJ SOLN: 0.5000 mg | INTRAMUSCULAR | Status: DC | PRN

## 2019-04-18 MED ORDER — SODIUM CHLORIDE 0.9% FLUSH
3.0000 mL | INTRAVENOUS | Status: DC | PRN
  Administered 2019-04-18: 3 mL via INTRAVENOUS
  Filled 2019-04-18: qty 3

## 2019-04-18 MED ORDER — ONDANSETRON HCL 4 MG/2ML IJ SOLN
4.0000 mg | Freq: Four times a day (QID) | INTRAMUSCULAR | Status: DC | PRN
  Administered 2019-04-18: 4 mg via INTRAVENOUS
  Filled 2019-04-18: qty 2

## 2019-04-18 MED ORDER — BIOTENE DRY MOUTH MT LIQD: 15.0000 mL | OROMUCOSAL | Status: DC | PRN

## 2019-04-18 MED ORDER — ACETAMINOPHEN 325 MG PO TABS: 650.0000 mg | ORAL_TABLET | Freq: Four times a day (QID) | ORAL | Status: DC | PRN

## 2019-04-18 NOTE — TOC Progression Note (Signed)
Transition of Care [facility]) - Progression Note    Patient Details  Name: Philip Garrett MRN: [id number] Date of Birth: [birth year]  Transition of Care [facility]) CM/SW [employer], [place] Phone Number: 04/18/2019, 4:24 PM  Clinical Narrative:   Family is interested in hospice facility placement. CSW spoke with patient's niece Philip Garrett at bedside. Philip Garrett states that they would like patient moved to hospice facility when he is stable. Family is interested in [facility] facility. CSW notified Philip Garrett, hospice liaison with [facility] of referral. CSW will continue to follow for discharge planning.          Expected Discharge Plan and Services                                                 Social Determinants of Health (SDOH) Interventions    Readmission Risk Interventions No flowsheet data found.

## 2019-04-18 NOTE — Progress Notes (Addendum)
Daily Progress Note   Patient Name: Philip Garrett       Date: 04/18/2019 DOB: November 18, 1931  Age: 85 y.o. MRN#: [id number] Attending Physician: Tyler Pita, MD Primary Care Physician: Jodi Marble, MD Admit Date: 04/12/2019  Reason for Consultation/Follow-up: Establishing goals of care  Subjective: Patient is resting in bed on ventilator. His niece Philip Garrett and her daughter came to bedside. They state there is no HPOA or guardian,  he is not now nor has he ever been married, he never had children, his parents are both deceased, and his siblings have all died. They state they have no contact information for his other nieces or nephews as they have not spoken with him or them in many years. They pay his bills and help with his day to day needs. Given this, it appears Philip Garrett would be his surrogate decision maker.    We discussed his diagnoses, prognosis, GOC, EOL wishes disposition and options.  A detailed discussion was had today regarding advanced directives.  Concepts specific to code status, artifical feeding and hydration, IV antibiotics and rehospitalization were discussed.  The difference between an aggressive medical intervention path and a comfort care path was discussed.  Values and goals of care important to patient and family were attempted to be elicited.  Discussed limitations of medical interventions to prolong quality of life in some situations and discussed the concept of human mortality.  They discuss his physical and emotional decline, as well as his declining QOL. They state he had lived in assisted living, and was depressed about that as he was very independent and private. They state he lived alone and did not have visitors except them and his brother. Once his brother died,  it was just them. He did not want to use a cane or walker and had falls. He was moved into a higher level of care. He was unhappy because he wanted to chew tobacco, which family states the facility was trying to wean him off of. He liked to wear jeans and they would not let him as they were hard to get on and off. He wanted to wear shoes that the facility did not want him to wear because they were not stable enough. He was further depressed by his continued decline and need for assistance. They state he  would never want to live with any QOL that is the same as before this admission or worse. They state he would not want to live in a facility, and would not want dialysis or a trach.   The two state they have been talking and are ready to remove the breathing tube and shift him to comfort care today. They inquire about transfer to hospice facility. Discussed that if he is stable off ventilator, a bed will be requested at hospice facility. Medications in place for comfort including Fentanyl, Ativan, Haldol, Robinul.   I completed a MOST form today and the signed original was placed in the chart. A photocopy was placed in the chart to be scanned into EMR. The patient's niece outlined their wishes for the following treatment decisions:  Cardiopulmonary Resuscitation: Do Not Attempt Resuscitation (DNR/No CPR)  Medical Interventions: Comfort Measures: Keep clean, warm, and dry. Use medication by any route, positioning, wound care, and other measures to relieve pain and suffering. Use oxygen, suction and manual treatment of airway obstruction as needed for comfort. Do not transfer to the hospital unless comfort needs cannot be met in current location.  Antibiotics: No antibiotics (use other measures to relieve symptoms)  IV Fluids: No IV fluids (provide other measures to ensure comfort)  Feeding Tube: No feeding tube         ADDENDUM: Patient extubated and weaned off Fentanyl drip. Dilaudid PRN in place for  pain or SOB. Request for Hospice facility placement requested.    Length of Stay: 6  Current Medications: Scheduled Meds:  . B-complex with vitamin C  1 tablet Per Tube Daily  . chlorhexidine gluconate (MEDLINE KIT)  15 mL Mouth Rinse BID  . Chlorhexidine Gluconate Cloth  6 each Topical Daily  . clopidogrel  75 mg Oral Daily  . feeding supplement (PRO-STAT SUGAR FREE 64)  60 mL Per Tube TID  . feeding supplement (VITAL 1.5 CAL)  1,000 mL Per Tube Q24H  . heparin injection (subcutaneous)  5,000 Units Subcutaneous Q8H  . insulin aspart  0-15 Units Subcutaneous Q4H  . insulin aspart  3 Units Subcutaneous Q4H  . latanoprost  1 drop Both Eyes QHS  . mouth rinse  15 mL Mouth Rinse 10 times per day  . pantoprazole  40 mg Oral Daily    Continuous Infusions: . cefTRIAXone (ROCEPHIN)  IV Stopped (04/17/19 1804)  . fentaNYL infusion INTRAVENOUS 250 mcg/hr (04/18/19 0617)  . norepinephrine (LEVOPHED) Adult infusion Stopped (04/16/19 0544)    PRN Meds: acetaminophen **OR** acetaminophen, ipratropium-albuterol, midazolam, sodium chloride flush  Physical Exam Pulmonary:     Comments: On ventilator.  Musculoskeletal:     Comments: Edema noted in upper extremities.   Skin:    General: Skin is warm and dry.             Vital Signs: BP 110/60   Pulse 75   Temp 100.2 F (37.9 C) (Axillary)   Resp 20   Ht _0  (1.93 m)   Wt 113.8 kg   SpO2 98%   BMI 30.54 kg/m  SpO2: SpO2: 98 % O2 Device: O2 Device: Ventilator O2 Flow Rate:    Intake/output summary:   Intake/Output Summary (Last 24 hours) at 04/18/2019 1103 Last data filed at 04/18/2019 0617 Gross per 24 hour  Intake 575.61 ml  Output 270 ml  Net 305.61 ml   LBM: Last BM Date: 04/18/19 Baseline Weight: Weight: 127 kg Most recent weight: Weight: 113.8 kg  Palliative Assessment/Data: on vent      Patient Active Problem List   Diagnosis Date Noted  . Septic shock ([place])   . Acute respiratory failure with  hypoxia ([place])   . Lactic acidosis   . Acute renal failure (ARF) ([place]) 04/12/2019  . Essential hypertension 04/12/2019  . Type 2 diabetes mellitus with other specified complication ([place]) 69/48/5462  . Chronic diastolic CHF (congestive heart failure) ([place]) 04/12/2019  . Coronary artery disease due to lipid rich plaque 04/12/2019  . Hyponatremia 04/12/2019  . Hyperkalemia 04/12/2019  . Metabolic acidosis 70/35/0093  . Transaminitis 04/12/2019  . Pressure injury of buttock, stage 1 04/12/2019  . SBO (small bowel obstruction) ([place]) 01/18/2016  . Hypoglycemia 09/14/2015    Palliative Care Assessment & Plan     Recommendations/Plan:  Transition to comfort care with extubation. MOST form completed.     Code Status:    Code Status Orders  (From admission, onward)         Start     Ordered   04/13/19 0613  Do not attempt resuscitation (DNR)  Continuous    Question Answer Comment  In the event of cardiac or respiratory ARREST Do not call a "code blue"   In the event of cardiac or respiratory ARREST Do not perform Intubation, CPR, defibrillation or ACLS   In the event of cardiac or respiratory ARREST Use medication by any route, position, wound care, and other measures to relive pain and suffering. May use oxygen, suction and manual treatment of airway obstruction as needed for comfort.   Comments DNI      04/13/19 8182        Code Status History    Date Active Date Inactive Code Status Order ID Comments User Context   04/12/2019 1952 04/13/2019 0613 Full Code [id number]  Edwin Dada, MD ED   01/18/2016 1338 01/21/2016 1550 Full Code [id number]  Jeanie Cooks, MD ED   09/14/2015 1502 09/15/2015 2015 Full Code [id number]  Hower, Aaron Mose, MD ED   Advance Care Planning Activity       Prognosis:   < 2 weeks if he I stable without the ventilator. Stopping dialysis. Stopping antibiotics. Extubating today. If he is not stable off ventilator, anticipate hospital death.    Discharge Planning:  To Be Determined  Care plan was discussed with CCM, nephrology  Thank you for allowing the Palliative Medicine Team to assist in the care of this patient.   Time In: 10:00 Time Out: 11:30 Total Time 90 min Prolonged Time Billed  yes      Greater than 50%  of this time was spent counseling and coordinating care related to the above assessment and plan.  Asencion Gowda, NP  Please contact Palliative Medicine Team phone at [phone] for questions and concerns.

## 2019-04-18 NOTE — Progress Notes (Signed)
New referral for [facility] hospice home received from [place]. Patient information given to referral. Telephone message left for patient's niece Philip Garrett. Will plan to follow up with family in the morning. Flo Shanks BSN, RN, [place] [phone]

## 2019-04-18 NOTE — Progress Notes (Signed)
Nutrition Brief Follow-Up Note  Chart reviewed and patient discussed on rounds. Patient now transitioning to comfort care.   No further nutrition interventions warranted at this time.  Please re-consult RD as needed.   Jacklynn Barnacle, MS, RD, LDN Office: [phone] Pager: [phone] After Hours/Weekend Pager: [phone]

## 2019-04-18 NOTE — TOC Initial Note (Signed)
Transition of Care [facility]) - Initial/Assessment Note    Patient Details  Name: Philip Garrett MRN: [id number] Date of Birth: 1931-10-22  [facility]) CM/SW Contact:    Annamaria Boots, [place] Phone Number: 04/18/2019, 3:22 PM  Clinical Narrative:      Patient is from [facility]. Family has decided to make patient comfort care at this time. Family has requested hospice facility if patient is stable enough for that. Patient extubated today. CSW will follow for potential hospice facility placement.                    Patient Goals and CMS Choice        Expected Discharge Plan and Services                                                Prior Living Arrangements/Services                       Activities of Daily Living      Permission Sought/Granted                  Emotional Assessment              Admission diagnosis:  Dehydration [E86.0] Lactic acidosis [E87.2] SOB (shortness of breath) [R06.02] Acute renal failure, unspecified acute renal failure type (HCC) [N17.9] Non-intractable vomiting with nausea, unspecified vomiting type [R11.2] Acute renal failure (ARF) ([place]) [N17.9] Patient Active Problem List   Diagnosis Date Noted  . Septic shock ([place])   . Acute respiratory failure with hypoxia ([place])   . Lactic acidosis   . Acute renal failure (ARF) ([place]) 04/12/2019  . Essential hypertension 04/12/2019  . Type 2 diabetes mellitus with other specified complication ([place]) 123456  . Chronic diastolic CHF (congestive heart failure) ([place]) 04/12/2019  . Coronary artery disease due to lipid rich plaque 04/12/2019  . Hyponatremia 04/12/2019  . Hyperkalemia 04/12/2019  . Metabolic acidosis 123456  . Transaminitis 04/12/2019  . Pressure injury of buttock, stage 1 04/12/2019  . SBO (small bowel obstruction) ([place]) 01/18/2016  . Hypoglycemia 09/14/2015   PCP:  Jodi Marble, MD Pharmacy:   [facility], [place] - [address] [place] [address] Phone: [phone] Fax: [phone]     Social Determinants of Health (SDOH) Interventions    Readmission Risk Interventions No flowsheet data found.

## 2019-04-18 NOTE — Progress Notes (Signed)
Patient extubated per MD order. Patient was suctioned pre/post extubation. RN and family members at bedside. Patient tolerated extubation well. Placed on 2L [place].

## 2019-04-18 NOTE — Progress Notes (Signed)
Ch received a referral from intensivist and palliative care NP to visit pt and family. Pt was not responsive due to being intubated. The pt's family was bedside with the pt. The family were somber but were in a place of acceptance regarding the pt being placed on C-C status. Ch provided words of comfort and a compassionate presence. F/u recommended.     04/18/19 1200  Clinical Encounter Type  Visited With Patient and family together;Health care provider  Visit Type Spiritual support;Social support;Critical Care;Patient actively dying  Referral From Physician  Consult/Referral To Chaplain  Stress Factors  Patient Stress Factors Major life changes;Health changes  Family Stress Factors Loss of control;Major life changes

## 2019-04-18 NOTE — Progress Notes (Addendum)
CRITICAL CARE NOTE  CC  follow upacute renal failure andseptic shock andlactic acidosis  SUBJECTIVE Patient remains critically ill Prognosis is guarded Remains intubated on ventilator at FiO2 30%.  Palliative consulted. Plan to make comfort care today    BP 110/60   Pulse 75   Temp 100.2 F (37.9 C) (Axillary)   Resp 20   Ht 6\' 4"  (1.93 m)   Wt 113.8 kg   SpO2 98%   BMI 30.54 kg/m    I/O last 3 completed shifts: In: 941.3 [I.V.:851.1; IV Piggyback:90.2] Out: 417 [Urine:417] No intake/output data recorded.  SpO2: 98 % FiO2 (%): 30 %   SIGNIFICANT EVENTS 11/9:Pt remains on ventilator today at FiO2 of 30%. He is currently sedated on fentanyl and is on levophed. He is unresponsive. Pt remains on CRRT. 11/10:Off pressors. Plan to switch from CRRT to HD today. Responsive to verbal stimuli today. Opens eyes to command. Moves extremities, but not purposefully. Does not track. On fentanyl dripfor ventilator discomfort, low-dose. 11/11: Similar status as yesterday - responsive to verbal stimuli, opens eyes to commands. Moves extremities non-purposefully. Creatinine up to 2.69 today. 11/12: Palliative consulted yesterday. Plan to make pt comfort care today   REVIEW OF SYSTEMS  PATIENT IS UNABLE TO PROVIDE COMPLETE REVIEW OF SYSTEMS DUE TO SEVERE CRITICAL ILLNESS   PHYSICAL EXAMINATION:  GENERAL:intubated, responsive to verbal stimuli, does not track HEAD: Normocephalic, atraumatic.  EYES: Pupils equal, round,nonreactive to light. No scleral icterus.  MOUTH:ET and OG tube in place NECK: Supple.Trachea midline. PULMONARY:Coarse breath sounds bilaterally  CARDIOVASCULAR: S1 and S2. Regular rate and rhythm. No murmurs, rubs, or gallops.  GASTROINTESTINAL: Soft, non-distended. No masses. Positive bowel sounds.  MUSCULOSKELETAL:Edematous extremities (anasarca 2+).Missing first two toes on left foot NEUROLOGIC:Responsive to verbal stimuli. Opens eyes to  command. Non-purposeful movement of extremities SKIN:intact,warm,dry  MEDICATIONS: I have reviewed all medications and confirmed regimen as documented   CULTURE RESULTS   Recent Results (from the past 240 hour(s))  Blood culture (routine x 2)     Status: None   Collection Time: 04/12/19 11:58 AM   Specimen: BLOOD  Result Value Ref Range Status   Specimen Description BLOOD RIGHT ANTECUBITAL  Final   Special Requests   Final    BOTTLES DRAWN AEROBIC AND ANAEROBIC Blood Culture adequate volume   Culture   Final    NO GROWTH 5 DAYS Performed at [facility], [address], [place], [place] [address]    Report Status 04/17/2019 FINAL  Final  SARS CORONAVIRUS 2 (TAT 6-24 HRS) Nasopharyngeal Nasopharyngeal Swab     Status: None   Collection Time: 04/12/19 11:58 AM   Specimen: Nasopharyngeal Swab  Result Value Ref Range Status   SARS Coronavirus 2 NEGATIVE NEGATIVE Final    Comment: (NOTE) SARS-CoV-2 target nucleic acids are NOT DETECTED. The SARS-CoV-2 RNA is generally detectable in upper and lower respiratory specimens during the acute phase of infection. Negative results do not preclude SARS-CoV-2 infection, do not rule out co-infections with other pathogens, and should not be used as the sole basis for treatment or other patient management decisions. Negative results must be combined with clinical observations, patient history, and epidemiological information. The expected result is Negative. Fact Sheet for Patients: SugarRoll.be Fact Sheet for Healthcare Providers: https://www.woods-mathews.com/ This test is not yet approved or cleared by the [place] FDA and  has been authorized for detection and/or diagnosis of SARS-CoV-2 by FDA under an Emergency Use Authorization (EUA). This EUA will remain  in effect (  meaning this test can be used) for the duration of the COVID-19 declaration under Section 56 4(b)(1) of the Act,  21 U.S.C. section 360bbb-3(b)(1), unless the authorization is terminated or revoked sooner. Performed at [facility], [place] [address] [place], [place] [address]   Urine Culture     Status: Abnormal   Collection Time: 04/12/19  2:18 PM   Specimen: Urine, Catheterized  Result Value Ref Range Status   Specimen Description   Final    URINE, CATHETERIZED Performed at [facility], [address] [place], [place] [address]    Special Requests   Final    NONE Performed at [facility], [address] [place], [place] [address]    Culture >=100,000 COLONIES/mL ESCHERICHIA COLI (A)  Final   Report Status 04/14/2019 FINAL  Final   Organism ID, Bacteria ESCHERICHIA COLI (A)  Final      Susceptibility   Escherichia coli - MIC*    AMPICILLIN >=32 RESISTANT Resistant     CEFAZOLIN <=4 SENSITIVE Sensitive     CEFTRIAXONE <=1 SENSITIVE Sensitive     CIPROFLOXACIN <=0.25 SENSITIVE Sensitive     GENTAMICIN <=1 SENSITIVE Sensitive     IMIPENEM <=0.25 SENSITIVE Sensitive     NITROFURANTOIN <=16 SENSITIVE Sensitive     TRIMETH/SULFA <=20 SENSITIVE Sensitive     AMPICILLIN/SULBACTAM >=32 RESISTANT Resistant     PIP/TAZO <=4 SENSITIVE Sensitive     Extended ESBL NEGATIVE Sensitive     * >=100,000 COLONIES/mL ESCHERICHIA COLI  MRSA PCR Screening     Status: None   Collection Time: 04/13/19  5:59 AM   Specimen: Nasal Mucosa; Nasopharyngeal  Result Value Ref Range Status   MRSA by PCR NEGATIVE NEGATIVE Final    Comment:        The GeneXpert MRSA Assay (FDA approved for NASAL specimens only), is one component of a comprehensive MRSA colonization surveillance program. It is not intended to diagnose MRSA infection nor to guide or monitor treatment for MRSA infections. Performed at [facility], [address] [place], [place] [address]   Culture, respiratory (non-expectorated)     Status: None (Preliminary result)   Collection Time: 04/17/19 11:16 AM    Specimen: Tracheal Aspirate; Respiratory  Result Value Ref Range Status   Specimen Description   Final    TRACHEAL ASPIRATE Performed at [facility], [address], [place], [place] [address]    Special Requests   Final    Normal Performed at [facility], [address] [place], [place] [address]    Gram Stain   Final    MODERATE WBC PRESENT, PREDOMINANTLY PMN FEW GRAM POSITIVE DIPLOCOCCI Performed at [facility], [place] [address] [place], [place] [address]    Culture ABUNDANT GRAM NEGATIVE RODS  Final   Report Status PENDING  Incomplete          IMAGING    No results found.     Indwelling Urinary Catheter continued, requirement due to   Reason to continue Indwelling Urinary Catheter strict Intake/Output monitoring for hemodynamic instability   Central Line/ continued, requirement due to  Reason to continue [place] of central venous pressure or other hemodynamic parameters and poor IV access   Ventilator continued, requirement due to severe respiratory failure   Ventilator Sedation RASS 0 to -2      ASSESSMENT AND PLAN SYNOPSIS  ACUTE Hypoxic and Hypercapnic Respiratory Failure -One-way extubation today with transition to comfort care  ACUTE KIDNEY INJURY/Renal Failure -appreciate nephrology consult and recommendations -continue Foley Catheter-for comfort, patient comfort care -No dialysis, patient transition to comfort care   NEUROLOGY - intubated and sedatedon minimal fentanyl - minimal sedation to achieve a RASS goal:0- 1 -More responsive today though still not tracking, not following commands   SHOCK-SEPTIC/HYPOVOLEMIC/CARDIOGENIC -shock physiology resolved  -use vasopressors to keep MAP>65-off pressors   HEME -Hbg stable today at 7.3 -Restart subQ heparin - switch from q8 to q12, discontinue once on comfort care  -No evidence of active bleeding  CARDIAC ICU  monitoring  ID -continue IV abx as prescribed -E. coli in urine. Resistant to ampicillin and Unasyn -WBC WNL -Copious ETT secretions, sputum culture revealed gram-negative rods ideally should switch to cefepime however patient transitioned to comfort care -Medications not needed for comfort will be discontinued  GI GI PROPHYLAXIS not indicated Episode of vomiting, n.p.o. for now  NUTRITIONAL STATUS Tube feeds discontinued after extubation Constipation protocol as indicated  ENDO Discontinue CBGs, transitioning to comfort   ELECTROLYTES Repleted.  No further labs.  Transitioning to comfort   DVT/GI PRX not indicated, comfort care Discontinued ICU hyperglycemia protocol   Patient has been made comfort care today.  Discussed with Palliative Care.  Discussed at multidisciplinary rounds  Donnamarie Rossetti, [place] PA-S, acted as my scribe.  Renold Don, MD [facility]  This note was dictated using voice recognition software/Dragon.  Despite best efforts to proofread, errors can occur which can change the meaning.  Any change was purely unintentional.

## 2019-04-18 NOTE — Progress Notes (Signed)
[facility]  ROUNDING NOTE   Subjective:   CRRT stopped yesterday evening. Hemodynamically stable. UOP 115m.   Tmax 101.1  UOP 3330m   Creatinine 3.84 (2.95)   Objective:  Vital signs in last 24 hours:  Temp:  [99.5 F (37.5 C)-101.1 F (38.4 C)] 100.2 F (37.9 C) (11/12 0400) Pulse Rate:  [75-96] 75 (11/12 0600) Resp:  [12-24] 20 (11/12 0600) BP: (91-155)/(48-90) 110/60 (11/12 0600) SpO2:  [89 %-100 %] 98 % (11/12 0600) FiO2 (%):  [30 %] 30 % (11/12 0830)  Weight change:  Filed Weights   04/14/19 0500 04/16/19 0500 04/17/19 0403  Weight: 110.4 kg 110.2 kg 113.8 kg    Intake/Output: I/O last 3 completed shifts: In: 941.3 [I.V.:851.1; IV Piggyback:90.2] Out: 417 [U[WIOMB:559] Intake/Output this shift:  No intake/output data recorded.  Physical Exam: General: Critically ill   Head: ETT  Eyes: Anicteric  Neck:   trachea midline  Lungs:  Bilateral rhonchi, PRVC FiO2 30%  Heart: regular  Abdomen:   bowel sounds present  Extremities: +peripheral edema.  Neurologic: Intubated, sedated  Skin: No lesions  Access:  Right femoral temporary hemodialysis catheter 1174/1  Basic Metabolic Panel: Recent Labs  Lab 04/14/19 0525  04/15/19 0553  04/16/19 0551 04/16/19 1045 04/16/19 1454 04/17/19 0617 04/17/19 1058 04/18/19 0349  NA 138   < > 136   < > 137 137 137 138 137 138  K 3.2*   < > 3.6   < > 3.8 3.9 4.4 3.8 3.9 3.9  CL 100   < > 96*   < > 103 102 104 104 102 106  CO2 20*   < > 28   < > 28 27 26 27 25 22   GLUCOSE 196*   < > 205*   < > 268* 258* 198* 129* 149* 201*  BUN 54*   < > 47*   < > 25* 22 20 26* 28* 44*  CREATININE 4.13*   < > 3.63*   < > 2.09* 2.00* 1.82* 2.69* 2.95* 3.84*  CALCIUM 7.0*   < > 6.9*   < > 7.4* 7.4* 7.4* 7.9* 8.0* 8.0*  MG 1.8  --  2.1  --  2.3  --   --  2.2  --  2.2  PHOS 5.0*   < > 4.2   < > 1.5* 1.7* 2.2* 2.7 2.9 3.7   < > = values in this interval not displayed.    Liver Function Tests: Recent Labs  Lab  04/12/19 1154 04/13/19 0355  04/13/19 1323  04/14/19 0525  04/16/19 1045 04/16/19 1454 04/17/19 0617 04/17/19 1058 04/18/19 0349  AST 87* 89*  --  87*  --  159*  --   --   --   --   --   --   ALT 48* 56*  --  68*  --  108*  --   --   --   --   --   --   ALKPHOS 68 60  --  59  --  54  --   --   --   --   --   --   BILITOT 0.7 0.7  --  1.0  --  0.7  --   --   --   --   --   --   PROT 7.1 6.1*  --  6.0*  --  5.5*  --   --   --   --   --   --  ALBUMIN 3.1* 2.6*   < > 2.7*   < > 2.7*  2.8*   < > 2.7* 2.5* 2.5* 2.5* 2.3*   < > = values in this interval not displayed.   No results for input(s): LIPASE, AMYLASE in the last 168 hours. Recent Labs  Lab 04/13/19 1046  AMMONIA 269*    CBC: Recent Labs  Lab 04/13/19 0355  04/14/19 0134 04/15/19 0553 04/15/19 1814 04/16/19 0551 04/17/19 0617 04/18/19 0349  WBC 8.4   < > 13.6* 6.8  --  6.2 8.5 9.3  NEUTROABS 6.7  --  10.9* 5.2  --  4.9 7.4  --   HGB 9.3*   < > 7.8* 6.7* 8.0* 7.1* 7.9* 7.3*  HCT 30.6*   < > 24.1* 19.2* 24.4* 22.4* 24.5* 22.9*  MCV 91.3   < > 86.7 80.0  --  84.8 84.5 85.8  PLT 316   < > 234 161  --  141* 146* 208   < > = values in this interval not displayed.    Cardiac Enzymes: No results for input(s): CKTOTAL, CKMB, CKMBINDEX, TROPONINI in the last 168 hours.  BNP: Invalid input(s): POCBNP  CBG: Recent Labs  Lab 04/17/19 1616 04/17/19 1923 04/18/19 0052 04/18/19 0341 04/18/19 0741  GLUCAP 179* 216* 199* 190* 182*    Microbiology: Results for orders placed or performed during the hospital encounter of 04/12/19  Blood culture (routine x 2)     Status: None   Collection Time: 04/12/19 11:58 AM   Specimen: BLOOD  Result Value Ref Range Status   Specimen Description BLOOD RIGHT ANTECUBITAL  Final   Special Requests   Final    BOTTLES DRAWN AEROBIC AND ANAEROBIC Blood Culture adequate volume   Culture   Final    NO GROWTH 5 DAYS Performed at [facility], [address],  [place], [place] [address]    Report Status 04/17/2019 FINAL  Final  SARS CORONAVIRUS 2 (TAT 6-24 HRS) Nasopharyngeal Nasopharyngeal Swab     Status: None   Collection Time: 04/12/19 11:58 AM   Specimen: Nasopharyngeal Swab  Result Value Ref Range Status   SARS Coronavirus 2 NEGATIVE NEGATIVE Final    Comment: (NOTE) SARS-CoV-2 target nucleic acids are NOT DETECTED. The SARS-CoV-2 RNA is generally detectable in upper and lower respiratory specimens during the acute phase of infection. Negative results do not preclude SARS-CoV-2 infection, do not rule out co-infections with other pathogens, and should not be used as the sole basis for treatment or other patient management decisions. Negative results must be combined with clinical observations, patient history, and epidemiological information. The expected result is Negative. Fact Sheet for Patients: SugarRoll.be Fact Sheet for Healthcare Providers: https://www.woods-mathews.com/ This test is not yet approved or cleared by the [place] FDA and  has been authorized for detection and/or diagnosis of SARS-CoV-2 by FDA under an Emergency Use Authorization (EUA). This EUA will remain  in effect (meaning this test can be used) for the duration of the COVID-19 declaration under Section 56 4(b)(1) of the Act, 21 U.S.C. section 360bbb-3(b)(1), unless the authorization is terminated or revoked sooner. Performed at [facility], [place] [address], [place], [place] [address]   Urine Culture     Status: Abnormal   Collection Time: 04/12/19  2:18 PM   Specimen: Urine, Catheterized  Result Value Ref Range Status   Specimen Description   Final    URINE, CATHETERIZED Performed at [facility], [place], [place]  [address]    Special Requests   Final    NONE Performed at [facility], [address] [place], [place] [address]    Culture >=100,000 COLONIES/mL  ESCHERICHIA COLI (A)  Final   Report Status 04/14/2019 FINAL  Final   Organism ID, Bacteria ESCHERICHIA COLI (A)  Final      Susceptibility   Escherichia coli - MIC*    AMPICILLIN >=32 RESISTANT Resistant     CEFAZOLIN <=4 SENSITIVE Sensitive     CEFTRIAXONE <=1 SENSITIVE Sensitive     CIPROFLOXACIN <=0.25 SENSITIVE Sensitive     GENTAMICIN <=1 SENSITIVE Sensitive     IMIPENEM <=0.25 SENSITIVE Sensitive     NITROFURANTOIN <=16 SENSITIVE Sensitive     TRIMETH/SULFA <=20 SENSITIVE Sensitive     AMPICILLIN/SULBACTAM >=32 RESISTANT Resistant     PIP/TAZO <=4 SENSITIVE Sensitive     Extended ESBL NEGATIVE Sensitive     * >=100,000 COLONIES/mL ESCHERICHIA COLI  MRSA PCR Screening     Status: None   Collection Time: 04/13/19  5:59 AM   Specimen: Nasal Mucosa; Nasopharyngeal  Result Value Ref Range Status   MRSA by PCR NEGATIVE NEGATIVE Final    Comment:        The GeneXpert MRSA Assay (FDA approved for NASAL specimens only), is one component of a comprehensive MRSA colonization surveillance program. It is not intended to diagnose MRSA infection nor to guide or monitor treatment for MRSA infections. Performed at [facility], [address] [place], [place] [address]   Culture, respiratory (non-expectorated)     Status: None (Preliminary result)   Collection Time: 04/17/19 11:16 AM   Specimen: Tracheal Aspirate; Respiratory  Result Value Ref Range Status   Specimen Description   Final    TRACHEAL ASPIRATE Performed at [facility], [address] [place], [place] [address]    Special Requests   Final    Normal Performed at [facility], [address] [place], [place] [address]    Gram Stain   Final    MODERATE WBC PRESENT, PREDOMINANTLY PMN FEW GRAM POSITIVE DIPLOCOCCI Performed at [facility], [place] [address] [place], [place] [address]    Culture ABUNDANT GRAM NEGATIVE RODS  Final   Report Status PENDING  Incomplete     Coagulation Studies: No results for input(s): LABPROT, INR in the last 72 hours.  Urinalysis: No results for input(s): COLORURINE, LABSPEC, PHURINE, GLUCOSEU, HGBUR, BILIRUBINUR, KETONESUR, PROTEINUR, UROBILINOGEN, NITRITE, LEUKOCYTESUR in the last 72 hours.  Invalid input(s): APPERANCEUR    Imaging: Dg Chest Port 1 View  Result Date: 04/16/2019 CLINICAL DATA:  Aspiration. EXAM: PORTABLE CHEST 1 VIEW COMPARISON:  04/14/2019. FINDINGS: Endotracheal tube and NG tube noted in stable position. Heart size normal. Low lung volumes with bibasilar atelectasis. Left base infiltrate. No pleural effusion or pneumothorax. Carotid vascular calcification. IMPRESSION: 1.  Endotracheal tube and NG tube in stable position. 2. Low lung volumes with bibasilar atelectasis. Mild left base infiltrate consistent with pneumonia. 3.  Carotid vascular disease. Electronically Signed   By: Marcello Moores  Register   On: 04/16/2019 14:48     Medications:   . cefTRIAXone (ROCEPHIN)  IV Stopped (04/17/19 1804)  . fentaNYL infusion INTRAVENOUS 250 mcg/hr (04/18/19 0617)  . norepinephrine (LEVOPHED) Adult infusion Stopped (04/16/19 0544)   . B-complex with vitamin C  1 tablet Per Tube Daily  . chlorhexidine gluconate (MEDLINE KIT)  15 mL Mouth Rinse BID  . Chlorhexidine Gluconate Cloth  6 each Topical Daily  .  clopidogrel  75 mg Oral Daily  . feeding supplement (PRO-STAT SUGAR FREE 64)  60 mL Per Tube TID  . feeding supplement (VITAL 1.5 CAL)  1,000 mL Per Tube Q24H  . heparin injection (subcutaneous)  5,000 Units Subcutaneous Q8H  . insulin aspart  0-15 Units Subcutaneous Q4H  . insulin aspart  3 Units Subcutaneous Q4H  . latanoprost  1 drop Both Eyes QHS  . mouth rinse  15 mL Mouth Rinse 10 times per day  . pantoprazole  40 mg Oral Daily   acetaminophen **OR** acetaminophen, ipratropium-albuterol, midazolam, sodium chloride flush  Assessment/ Plan:  85 y.o.black male with congestive heart failure, coronary  artery disease, diabetes mellitus type 2, hyperlipidemia, hypertension, myocardial infarction, stroke, who was admitted to [facility] on 04/12/2019 for nausea, diarrhea, low grade fever.   1.  Acute kidney injury: baseline creatinine of 1.11, GFR of >60 on 04/05/2019 2. Acute respiratory failure. 5.  Hypotension - off vasopressors 6. Anemia of renal failure  Plan: requiring renal replacement therapy. Transition to intermittent hemodialysis when more stable. Plan on dialysis treatment for later today. Orders to be prepared.  EPO with HD treatment.   Dispo: family meeting for today. Appreciate palliative care.    LOS: 6 Bronte Sabado 11/12/20209:52 AM

## 2019-04-18 NOTE — Progress Notes (Signed)
Pt extubated to [facility] with no complications. This RN and RT at the bedside. Pt on comfort care and family at the bedside at this time.

## 2019-04-19 DIAGNOSIS — Z515 Encounter for palliative care: Secondary | ICD-10-CM

## 2019-04-19 LAB — CULTURE, RESPIRATORY W GRAM STAIN: Special Requests: NORMAL

## 2019-04-19 MED ORDER — LORAZEPAM 2 MG/ML IJ SOLN: 0.5000 mg | INTRAMUSCULAR | 0 refills | Status: AC | PRN

## 2019-04-19 MED ORDER — GLYCOPYRROLATE 0.2 MG/ML IJ SOLN: 0.3000 mg | INTRAMUSCULAR | 0 refills | Status: AC | PRN

## 2019-04-19 MED ORDER — HALOPERIDOL 0.5 MG PO TABS: 0.5000 mg | ORAL_TABLET | ORAL | 0 refills | Status: AC | PRN

## 2019-04-19 MED ORDER — POLYVINYL ALCOHOL 1.4 % OP SOLN: 1.0000 [drp] | Freq: Four times a day (QID) | OPHTHALMIC | 0 refills | Status: AC | PRN

## 2019-04-19 MED ORDER — HYDROMORPHONE HCL 1 MG/ML IJ SOLN: 0.5000 mg | INTRAMUSCULAR | 0 refills | Status: AC | PRN

## 2019-04-19 NOTE — Progress Notes (Signed)
Daily Progress Note   Patient Name: Philip Garrett       Date: 04/19/2019 DOB: 01-26-1932  Age: 85 y.o. MRN#: [id number] Attending Physician: Louellen Molder, MD Primary Care Physician: Jodi Marble, MD Admit Date: 04/12/2019  Reason for Consultation/Follow-up: Establishing goals of care  Subjective: Patient is resting in bed with eyes closed. Sister and niece at bedside. No distress noted. Plans for transition to hospice facility. No changes to medication regimen.    Length of Stay: 7  Current Medications: Scheduled Meds:  . chlorhexidine gluconate (MEDLINE KIT)  15 mL Mouth Rinse BID  . Chlorhexidine Gluconate Cloth  6 each Topical Daily  . latanoprost  1 drop Both Eyes QHS  . sodium chloride flush  3 mL Intravenous Q12H    Continuous Infusions:   PRN Meds: acetaminophen **OR** acetaminophen, antiseptic oral rinse, glycopyrrolate, haloperidol **OR** haloperidol **OR** haloperidol lactate, HYDROmorphone (DILAUDID) injection, ipratropium-albuterol, LORazepam, ondansetron **OR** ondansetron (ZOFRAN) IV, polyvinyl alcohol, sodium chloride flush  Physical Exam Musculoskeletal:     Comments: Edema noted in upper extremities.   Skin:    General: Skin is warm and dry.             Vital Signs: BP (!) 129/55 (BP Location: Left Arm)   Pulse 79   Temp 98.5 F (36.9 C) (Oral)   Resp (!) 22   Ht 6' 4"  (1.93 m)   Wt 113.8 kg   SpO2 100%   BMI 30.54 kg/m  SpO2: SpO2: 100 % O2 Device: O2 Device: Nasal Cannula O2 Flow Rate: O2 Flow Rate (L/min): 0.5 L/min  Intake/output summary:   Intake/Output Summary (Last 24 hours) at 04/19/2019 1557 Last data filed at 04/19/2019 0553 Gross per 24 hour  Intake 0 ml  Output 800 ml  Net -800 ml   LBM: Last BM Date: 04/18/19 Baseline  Weight: Weight: 127 kg Most recent weight: Weight: 113.8 kg       Palliative Assessment/Data: 20%      Patient Active Problem List   Diagnosis Date Noted  . Hospice care patient 04/19/2019  . Septic shock ([place])   . Acute respiratory failure with hypoxia ([place])   . Lactic acidosis   . Acute renal failure (ARF) ([place]) 04/12/2019  . Essential hypertension 04/12/2019  . Type 2 diabetes mellitus with other specified complication (  [place]) 04/12/2019  . Chronic diastolic CHF (congestive heart failure) ([place]) 04/12/2019  . Coronary artery disease due to lipid rich plaque 04/12/2019  . Hyponatremia 04/12/2019  . Hyperkalemia 04/12/2019  . Metabolic acidosis 89/21/1941  . Transaminitis 04/12/2019  . Pressure injury of buttock, stage 1 04/12/2019  . SBO (small bowel obstruction) ([place]) 01/18/2016  . Hypoglycemia 09/14/2015    Palliative Care Assessment & Plan     Recommendations/Plan:  Plans to move to hospice facility    Code Status:    Code Status Orders  (From admission, onward)         Start     Ordered   04/13/19 0613  Do not attempt resuscitation (DNR)  Continuous    Question Answer Comment  In the event of cardiac or respiratory ARREST Do not call a "code blue"   In the event of cardiac or respiratory ARREST Do not perform Intubation, CPR, defibrillation or ACLS   In the event of cardiac or respiratory ARREST Use medication by any route, position, wound care, and other measures to relive pain and suffering. May use oxygen, suction and manual treatment of airway obstruction as needed for comfort.   Comments DNI      04/13/19 7408        Code Status History    Date Active Date Inactive Code Status Order ID Comments User Context   04/12/2019 1952 04/13/2019 0613 Full Code [id number]  Edwin Dada, MD ED   01/18/2016 1338 01/21/2016 1550 Full Code [id number]  Jeanie Cooks, MD ED   09/14/2015 1502 09/15/2015 2015 Full Code [id number]  Hower, Aaron Mose, MD ED    Advance Care Planning Activity       Prognosis:   < 2 weeks if he I stable without the ventilator. Stopping dialysis. Stopping antibiotics.   Discharge Planning:  To Be Determined   Thank you for allowing the Palliative Medicine Team to assist in the care of this patient.   Total Time 25 min Prolonged Time Billed  no      Greater than 50%  of this time was spent counseling and coordinating care related to the above assessment and plan.  Asencion Gowda, NP  Please contact Palliative Medicine Team phone at [phone] for questions and concerns.

## 2019-04-19 NOTE — Care Management Important Message (Signed)
Important Message  Patient Details  Name: Philip Garrett MRN: [id number] Date of Birth: Jul 01, 1931   Medicare Important Message Given:  Yes     Dannette Barbara 04/19/2019, 2:11 PM

## 2019-04-19 NOTE — Discharge Summary (Signed)
Physician Discharge Summary  BENETT EUSTACE [id number] DOB: 05-Apr-1932 DOA: 04/12/2019  PCP: Jodi Marble, MD  Admit date: 04/12/2019 Discharge date: 04/19/2019  Admitted From: snf Disposition:  Residential hospice  Recommendations for Outpatient Follow-up:  D/c to residential hospice   CODE STATUS:DNR Diet recommendation: regular/ comfort    Discharge Diagnoses:  Principal Problem:   Acute tubular necrosis(HCC)   Active Problems:   Essential hypertension   Type 2 diabetes mellitus with other specified complication (HCC)   Chronic diastolic CHF (congestive heart failure) (HCC)   Coronary artery disease due to lipid rich plaque   Hyponatremia   Hyperkalemia   Metabolic acidosis   Transaminitis   Pressure injury of buttock, stage 1   Septic shock (HCC)   Acute respiratory failure with hypoxia (HCC)   Lactic acidosis   Hospice care patient   Brief narrative / HPI and hospital course 85 year old African-American male admitted from [place] with nausea and vomiting x1 week, diarrhea, acute change in mental status, generalized weakness, hypotension and fever. Found to be in acute renal failure, with severe sepsis and septic shock from urinary tract source. Patient decompensated on the morning of November 7 requiring emergency respiratory and blood pressure support.  He had refractory shock, severe acute renal failure and hyperkalemia and severe lactic acidosis. Emergently transferred to the ICU.  The patient was DNR/DNI however discussions with the POA were performed by Dr. Lanney Gins who was the [facility] attending that day.  The decision was to continue DNR status however to attempt intubation and CRRT to see if potentially reversible causes of decompensation could be done.  Patient was intubated and underwent CRRT starting on 7 November, however he remained critically ill throughout his stay in the ICU.  Due to dementia this limited painful interaction from the  patient for successful extubation.  After discussion with nephrology, it was also noted that the patient would likely not have reversible renal failure and would be dialysis dependent for life.  At this point Palliative Care was then consulted.  Positive care saw the patient on 11 November and subsequently had a family meeting with the patient's family on 12 November.  At this point it was decided to extubate the patient with the intention of transitioning him to comfort care and to place him in a hospice facility.  The patient's family had noted that the patient had significant decline acutely over the last year and worse over the last month.  He had been in a skilled nursing facility for many years.  The patient was extubated on 12 November, transition to comfort care and transferred to the general medical ward.  Arrangements  made for transfer to hospice.  Goal for comfort. On prn dilaudid for pain . Ativan and haldol prn for anxiety and agitation.    Niece at bedside   Discharge Instructions   Allergies as of 04/19/2019   No Known Allergies     Medication List    STOP taking these medications   amLODipine 5 MG tablet Commonly known as: NORVASC   atorvastatin 20 MG tablet Commonly known as: LIPITOR   carvedilol 25 MG tablet Commonly known as: COREG   clopidogrel 75 MG tablet Commonly known as: PLAVIX   fluticasone 50 MCG/ACT nasal spray Commonly known as: FLONASE   insulin aspart 100 UNIT/ML injection Commonly known as: novoLOG   Levemir FlexTouch 100 UNIT/ML Pen Generic drug: Insulin Detemir   linagliptin 5 MG Tabs tablet Commonly known as: TRADJENTA  loratadine 10 MG tablet Commonly known as: CLARITIN   metFORMIN 1000 MG tablet Commonly known as: GLUCOPHAGE   montelukast 10 MG tablet Commonly known as: SINGULAIR   ramipril 10 MG capsule Commonly known as: ALTACE     TAKE these medications   acetaminophen 500 MG tablet Commonly known as: TYLENOL Take  1,000 mg by mouth every 4 (four) hours as needed for mild pain or moderate pain.   glycopyrrolate 0.2 MG/ML injection Commonly known as: ROBINUL Inject 1.5 mLs (0.3 mg total) into the vein every 4 (four) hours as needed (excessive secretions).   haloperidol 0.5 MG tablet Commonly known as: HALDOL Take 1 tablet (0.5 mg total) by mouth every 4 (four) hours as needed for agitation (or delirium).   HYDROmorphone 1 MG/ML injection Commonly known as: DILAUDID Inject 0.5-1 mLs (0.5-1 mg total) into the vein every 30 (thirty) minutes as needed for moderate pain or severe pain (air hunger).   latanoprost 0.005 % ophthalmic solution Commonly known as: XALATAN Place 1 drop into both eyes at bedtime.   LORazepam 2 MG/ML injection Commonly known as: ATIVAN Inject 0.25-0.5 mLs (0.5-1 mg total) into the vein every 4 (four) hours as needed for anxiety.   polyvinyl alcohol 1.4 % ophthalmic solution Commonly known as: LIQUIFILM TEARS Place 1 drop into both eyes 4 (four) times daily as needed for dry eyes.       No Known Allergies    Procedures/Studies: Ct Abdomen Pelvis Wo Contrast  Result Date: 04/13/2019 CLINICAL DATA:  Sepsis search, vomiting, diarrhea, cough EXAM: CT ABDOMEN AND PELVIS WITHOUT CONTRAST TECHNIQUE: Multidetector CT imaging of the abdomen and pelvis was performed following the standard protocol without IV contrast. COMPARISON:  01/18/2016 FINDINGS: Examination is generally limited by breath motion artifact throughout. Lower chest: Trace right pleural effusion. Three-vessel coronary artery calcifications. Hepatobiliary: No solid liver abnormality is seen. No gallstones, gallbladder wall thickening, or biliary dilatation. Pancreas: Unremarkable. No pancreatic ductal dilatation or surrounding inflammatory changes. Spleen: Normal in size without significant abnormality. Adrenals/Urinary Tract: Adrenal glands are unremarkable. Multiple small bilateral nonobstructive renal calculi.  Urinary bladder is decompressed by a Foley catheter although abnormally thickened. Stomach/Bowel: Stomach is within normal limits. Appendix is not clearly visualized. No evidence of bowel wall thickening, distention, or inflammatory changes. Vascular/Lymphatic: Aortic atherosclerosis. Right femoral vascular catheter, tip within the external iliac vein. No enlarged abdominal or pelvic lymph nodes. Reproductive: No mass or other significant abnormality. Other: No abdominal wall hernia or abnormality. No abdominopelvic ascites. Musculoskeletal: No acute or significant osseous findings. IMPRESSION: 1. Examination of the abdomen and pelvis is generally limited by breath motion artifact throughout as well as lack of intravenous contrast. 2. Urinary bladder is decompressed by a Foley catheter although abnormally thickened. This may be due to chronic outlet obstruction although correlate for urinalysis evidence of infectious or inflammatory cystitis. 3. No other CT findings of the abdomen or pelvis to suggest source of sepsis. 4. Bilateral nonobstructive nephrolithiasis. No ureteral calculi or hydronephrosis. 5.  Trace, nonspecific right pleural effusion. 6.  Coronary artery disease.  Aortic Atherosclerosis (ICD10-I70.0). Electronically Signed   By: Eddie Candle M.D.   On: 04/13/2019 13:46   Dg Chest 1 View  Result Date: 04/13/2019 CLINICAL DATA:  Initial evaluation for acute shortness of breath. EXAM: CHEST  1 VIEW COMPARISON:  Prior radiograph from 04/05/2019. FINDINGS: Mild cardiomegaly, stable. Mediastinal silhouette within normal limits. Lungs are hypoinflated. Secondary diffuse bronchovascular crowding. No focal infiltrates. No pulmonary edema or pleural effusion. No visible pneumothorax,  although the patient's head partially obscures the upper lungs. No acute osseous finding. IMPRESSION: 1. Shallow lung inflation with secondary diffuse bronchovascular crowding. 2. No other active cardiopulmonary disease.  Electronically Signed   By: Jeannine Boga M.D.   On: 04/13/2019 04:25   Dg Abd 1 View  Result Date: 04/13/2019 CLINICAL DATA:  OG tube placed EXAM: ABDOMEN - 1 VIEW COMPARISON:  April 12, 2019 FINDINGS: The enteric tube projects over the gastric body. The bowel gas pattern is nonobstructive. IMPRESSION: Enteric tube projects over the gastric body. Electronically Signed   By: Constance Holster M.D.   On: 04/13/2019 19:06   Ct Head Wo Contrast  Result Date: 04/13/2019 CLINICAL DATA:  Initial evaluation for acute altered mental status. EXAM: CT HEAD WITHOUT CONTRAST TECHNIQUE: Contiguous axial images were obtained from the base of the skull through the vertex without intravenous contrast. COMPARISON:  Prior head CT from 04/05/2019. FINDINGS: Brain: Atrophy with mild chronic small vessel ischemic disease again noted, stable. No acute intracranial hemorrhage. No acute large vessel territory infarct. No mass lesion, midline shift or mass effect. No hydrocephalus. No extra-axial fluid collection. Vascular: No hyperdense vessel. Calcified atherosclerosis at the skull base. Skull: Scalp soft tissues and calvarium within normal limits. Sinuses/Orbits: Globes and orbital soft tissues demonstrate no acute finding. Mild chronic mucoperiosteal thickening noted within the ethmoidal air cells. Paranasal sinuses are otherwise clear. No mastoid effusion. Other: None. IMPRESSION: 1. No acute intracranial abnormality. 2. Stable atrophy with mild chronic small vessel ischemic disease. Electronically Signed   By: Jeannine Boga M.D.   On: 04/13/2019 04:23   Ct Head Wo Contrast  Result Date: 04/05/2019 CLINICAL DATA:  Bilateral lower extremity weakness, fall. EXAM: CT HEAD WITHOUT CONTRAST CT CERVICAL SPINE WITHOUT CONTRAST TECHNIQUE: Multidetector CT imaging of the head and cervical spine was performed following the standard protocol without intravenous contrast. Multiplanar CT image reconstructions of the  cervical spine were also generated. COMPARISON:  February 22, 2019. FINDINGS: CT HEAD FINDINGS Brain: Mild chronic ischemic white matter disease is noted. No mass effect or midline shift is noted. Ventricular size is within normal limits. There is no evidence of mass lesion, hemorrhage or acute infarction. Vascular: No hyperdense vessel or unexpected calcification. Skull: Normal. Negative for fracture or focal lesion. Sinuses/Orbits: No acute finding. Other: None. CT CERVICAL SPINE FINDINGS Alignment: Minimal grade 1 anterolisthesis of C4-5 and C5-6 is noted secondary to posterior facet joint hypertrophy. Skull base and vertebrae: No acute fracture. No primary bone lesion or focal pathologic process. Soft tissues and spinal canal: No prevertebral fluid or swelling. No visible canal hematoma. Disc levels: Severe degenerative disc disease is noted at C5-6. Mild degenerative disc disease is noted at C4-5 and C6-7. Upper chest: Negative. Other: Degenerative changes are seen involving posterior facet joints bilaterally. IMPRESSION: Mild chronic ischemic white matter disease. No acute intracranial abnormality seen. Multilevel degenerative disc disease. No acute abnormality seen in the cervical spine. Electronically Signed   By: Marijo Conception M.D.   On: 04/05/2019 16:18   Ct Cervical Spine Wo Contrast  Result Date: 04/05/2019 CLINICAL DATA:  Bilateral lower extremity weakness, fall. EXAM: CT HEAD WITHOUT CONTRAST CT CERVICAL SPINE WITHOUT CONTRAST TECHNIQUE: Multidetector CT imaging of the head and cervical spine was performed following the standard protocol without intravenous contrast. Multiplanar CT image reconstructions of the cervical spine were also generated. COMPARISON:  February 22, 2019. FINDINGS: CT HEAD FINDINGS Brain: Mild chronic ischemic white matter disease is noted. No mass effect or midline  shift is noted. Ventricular size is within normal limits. There is no evidence of mass lesion, hemorrhage  or acute infarction. Vascular: No hyperdense vessel or unexpected calcification. Skull: Normal. Negative for fracture or focal lesion. Sinuses/Orbits: No acute finding. Other: None. CT CERVICAL SPINE FINDINGS Alignment: Minimal grade 1 anterolisthesis of C4-5 and C5-6 is noted secondary to posterior facet joint hypertrophy. Skull base and vertebrae: No acute fracture. No primary bone lesion or focal pathologic process. Soft tissues and spinal canal: No prevertebral fluid or swelling. No visible canal hematoma. Disc levels: Severe degenerative disc disease is noted at C5-6. Mild degenerative disc disease is noted at C4-5 and C6-7. Upper chest: Negative. Other: Degenerative changes are seen involving posterior facet joints bilaterally. IMPRESSION: Mild chronic ischemic white matter disease. No acute intracranial abnormality seen. Multilevel degenerative disc disease. No acute abnormality seen in the cervical spine. Electronically Signed   By: Marijo Conception M.D.   On: 04/05/2019 16:18   Mr Brain Wo Contrast  Result Date: 04/05/2019 CLINICAL DATA:  Fall bilateral lower extremity weakness EXAM: MRI HEAD WITHOUT CONTRAST TECHNIQUE: Multiplanar, multiecho pulse sequences of the brain and surrounding structures were obtained without intravenous contrast. COMPARISON:  Head CT 04/05/2019 FINDINGS: BRAIN: There is no acute infarct, acute hemorrhage or extra-axial collection. Multifocal white matter hyperintensity, most commonly due to chronic ischemic microangiopathy. Posterior fossa arachnoid cyst versus mega cisterna magna. The other midline structures are normal. Mild generalized atrophy, not greater than expected for age . VASCULAR: The major intracranial arterial and venous sinus flow voids are normal. Susceptibility-sensitive sequences show no chronic microhemorrhage or superficial siderosis. SKULL AND UPPER CERVICAL SPINE: Calvarial bone marrow signal is normal. There is no skull base mass. The visualized upper  cervical spine and soft tissues are normal. SINUSES/ORBITS: There are no fluid levels or advanced mucosal thickening. The mastoid air cells and middle ear cavities are free of fluid. The orbits are normal. IMPRESSION: 1. No acute intracranial abnormality. 2. Mild atrophy and chronic small vessel disease. Electronically Signed   By: Ulyses Jarred M.D.   On: 04/05/2019 23:29   Mr Cervical Spine Wo Contrast  Result Date: 04/05/2019 CLINICAL DATA:  Fall. Bilateral leg weakness. EXAM: MRI CERVICAL, THORACIC AND LUMBAR SPINE WITHOUT CONTRAST TECHNIQUE: Multiplanar and multiecho pulse sequences of the cervical spine, to include the craniocervical junction and cervicothoracic junction, and thoracic and lumbar spine, were obtained without intravenous contrast. COMPARISON:  None. FINDINGS: MRI CERVICAL SPINE FINDINGS Alignment: Trace grade 1 retrolisthesis at C6-7. Vertebrae: No acute compression fracture, discitis-osteomyelitis, facet edema or other focal marrow lesion. No epidural collection. Cord: Mild hyperintense T2-weighted signal in the spinal cord at the C3-4 level. Posterior Fossa, vertebral arteries, paraspinal tissues: Negative. Disc levels: C1-2: Unremarkable. C2-3: No spinal canal stenosis. C3-4: Intermediate sized central disc protrusion with ligamentum flavum redundancy causing severe spinal canal stenosis and cord compression. C4-5: Severe spinal canal stenosis with flattening of the spinal cord due to central disc protrusion. C5-6: Bilateral uncovertebral hypertrophy and small disc bulge with severe left foraminal stenosis. C6-7: Mild disc bulge without spinal canal stenosis. C7-T1: Normal. MRI THORACIC SPINE FINDINGS Alignment:  Physiologic. Vertebrae: No fracture, evidence of discitis, or bone lesion. Cord:  Normal signal and morphology. Paraspinal and other soft tissues: Negative. Disc levels: There is a small central disc protrusion at T7-8 without spinal canal stenosis. MRI LUMBAR SPINE FINDINGS  Segmentation:  Normal Alignment:  Normal Vertebrae: There is mild discogenic endplate signal change at L5-S1. Conus medullaris and cauda equina: Conus  extends to the L1 level. There is buckling of the cauda equina nerve roots. Paraspinal and other soft tissues: Negative Disc levels: L1-2: Normal. L2-3: Intermediate disc bulge with moderate spinal canal stenosis. No neural foraminal stenosis. L3-4: Large disc bulge, worst in the right subarticular region. Severe spinal canal stenosis with crowding of the cauda equina nerve roots. No neural foraminal stenosis. L4-5: Right subarticular disc protrusion with narrowing of the right subarticular recess. Mild right foraminal stenosis. No central spinal canal stenosis. L5-S1: Small central disc protrusion without spinal canal or neural foraminal stenosis. IMPRESSION: 1. Severe spinal canal stenosis at C3-4 and C4-5 with cord compression and mild hyperintense T2-weighted signal in the spinal cord at both levels, which may indicate edema or myelomalacia. 2. Severe L3-4 and moderate L2-3 spinal canal stenosis with crowding of the cauda equina nerve roots. 3. Unremarkable thoracic spine. Critical Value/emergent results were called by telephone at the time of interpretation on 04/05/2019 at 11:53 pm to Dr. Marjean Donna , who verbally acknowledged these results. Electronically Signed   By: Ulyses Jarred M.D.   On: 04/05/2019 23:55   Mr Thoracic Spine Wo Contrast  Result Date: 04/05/2019 CLINICAL DATA:  Fall. Bilateral leg weakness. EXAM: MRI CERVICAL, THORACIC AND LUMBAR SPINE WITHOUT CONTRAST TECHNIQUE: Multiplanar and multiecho pulse sequences of the cervical spine, to include the craniocervical junction and cervicothoracic junction, and thoracic and lumbar spine, were obtained without intravenous contrast. COMPARISON:  None. FINDINGS: MRI CERVICAL SPINE FINDINGS Alignment: Trace grade 1 retrolisthesis at C6-7. Vertebrae: No acute compression fracture, discitis-osteomyelitis,  facet edema or other focal marrow lesion. No epidural collection. Cord: Mild hyperintense T2-weighted signal in the spinal cord at the C3-4 level. Posterior Fossa, vertebral arteries, paraspinal tissues: Negative. Disc levels: C1-2: Unremarkable. C2-3: No spinal canal stenosis. C3-4: Intermediate sized central disc protrusion with ligamentum flavum redundancy causing severe spinal canal stenosis and cord compression. C4-5: Severe spinal canal stenosis with flattening of the spinal cord due to central disc protrusion. C5-6: Bilateral uncovertebral hypertrophy and small disc bulge with severe left foraminal stenosis. C6-7: Mild disc bulge without spinal canal stenosis. C7-T1: Normal. MRI THORACIC SPINE FINDINGS Alignment:  Physiologic. Vertebrae: No fracture, evidence of discitis, or bone lesion. Cord:  Normal signal and morphology. Paraspinal and other soft tissues: Negative. Disc levels: There is a small central disc protrusion at T7-8 without spinal canal stenosis. MRI LUMBAR SPINE FINDINGS Segmentation:  Normal Alignment:  Normal Vertebrae: There is mild discogenic endplate signal change at L5-S1. Conus medullaris and cauda equina: Conus extends to the L1 level. There is buckling of the cauda equina nerve roots. Paraspinal and other soft tissues: Negative Disc levels: L1-2: Normal. L2-3: Intermediate disc bulge with moderate spinal canal stenosis. No neural foraminal stenosis. L3-4: Large disc bulge, worst in the right subarticular region. Severe spinal canal stenosis with crowding of the cauda equina nerve roots. No neural foraminal stenosis. L4-5: Right subarticular disc protrusion with narrowing of the right subarticular recess. Mild right foraminal stenosis. No central spinal canal stenosis. L5-S1: Small central disc protrusion without spinal canal or neural foraminal stenosis. IMPRESSION: 1. Severe spinal canal stenosis at C3-4 and C4-5 with cord compression and mild hyperintense T2-weighted signal in the  spinal cord at both levels, which may indicate edema or myelomalacia. 2. Severe L3-4 and moderate L2-3 spinal canal stenosis with crowding of the cauda equina nerve roots. 3. Unremarkable thoracic spine. Critical Value/emergent results were called by telephone at the time of interpretation on 04/05/2019 at 11:53 pm to Dr. Stanton [facility] , who verbally acknowledged these results. Electronically Signed   By: Ulyses Jarred M.D.   On: 04/05/2019 23:55   Mr Lumbar Spine Wo Contrast  Result Date: 04/05/2019 CLINICAL DATA:  Fall. Bilateral leg weakness. EXAM: MRI CERVICAL, THORACIC AND LUMBAR SPINE WITHOUT CONTRAST TECHNIQUE: Multiplanar and multiecho pulse sequences of the cervical spine, to include the craniocervical junction and cervicothoracic junction, and thoracic and lumbar spine, were obtained without intravenous contrast. COMPARISON:  None. FINDINGS: MRI CERVICAL SPINE FINDINGS Alignment: Trace grade 1 retrolisthesis at C6-7. Vertebrae: No acute compression fracture, discitis-osteomyelitis, facet edema or other focal marrow lesion. No epidural collection. Cord: Mild hyperintense T2-weighted signal in the spinal cord at the C3-4 level. Posterior Fossa, vertebral arteries, paraspinal tissues: Negative. Disc levels: C1-2: Unremarkable. C2-3: No spinal canal stenosis. C3-4: Intermediate sized central disc protrusion with ligamentum flavum redundancy causing severe spinal canal stenosis and cord compression. C4-5: Severe spinal canal stenosis with flattening of the spinal cord due to central disc protrusion. C5-6: Bilateral uncovertebral hypertrophy and small disc bulge with severe left foraminal stenosis. C6-7: Mild disc bulge without spinal canal stenosis. C7-T1: Normal. MRI THORACIC SPINE FINDINGS Alignment:  Physiologic. Vertebrae: No fracture, evidence of discitis, or bone lesion. Cord:  Normal signal and morphology. Paraspinal and other soft tissues: Negative. Disc levels: There is a small central disc  protrusion at T7-8 without spinal canal stenosis. MRI LUMBAR SPINE FINDINGS Segmentation:  Normal Alignment:  Normal Vertebrae: There is mild discogenic endplate signal change at L5-S1. Conus medullaris and cauda equina: Conus extends to the L1 level. There is buckling of the cauda equina nerve roots. Paraspinal and other soft tissues: Negative Disc levels: L1-2: Normal. L2-3: Intermediate disc bulge with moderate spinal canal stenosis. No neural foraminal stenosis. L3-4: Large disc bulge, worst in the right subarticular region. Severe spinal canal stenosis with crowding of the cauda equina nerve roots. No neural foraminal stenosis. L4-5: Right subarticular disc protrusion with narrowing of the right subarticular recess. Mild right foraminal stenosis. No central spinal canal stenosis. L5-S1: Small central disc protrusion without spinal canal or neural foraminal stenosis. IMPRESSION: 1. Severe spinal canal stenosis at C3-4 and C4-5 with cord compression and mild hyperintense T2-weighted signal in the spinal cord at both levels, which may indicate edema or myelomalacia. 2. Severe L3-4 and moderate L2-3 spinal canal stenosis with crowding of the cauda equina nerve roots. 3. Unremarkable thoracic spine. Critical Value/emergent results were called by telephone at the time of interpretation on 04/05/2019 at 11:53 pm to Dr. Marjean Donna , who verbally acknowledged these results. Electronically Signed   By: Ulyses Jarred M.D.   On: 04/05/2019 23:55   US Renal  Result Date: 04/12/2019 CLINICAL DATA:  Acute renal failure EXAM: RENAL / URINARY TRACT ULTRASOUND COMPLETE COMPARISON:  CT abdomen pelvis 01/18/2016 FINDINGS: Right Kidney: Renal measurements: 10.7 x 5.7 x 5.7 cm = volume: 179.4 mL. Slightly increased renal cortical echogenicity and renal sinus lipomatosis. No shadowing calculus, hydronephrosis or concerning renal mass. Left Kidney: Renal measurements: 10.6 x 6.1 x 5.3 cm = volume: 180 mL. More normal cortical  echogenicity. Shadowing calculus is seen in the lower pole left kidney measuring 5 mm. No hydronephrosis or concerning renal mass. Bladder: Decompressed by Foley catheter and therefore poorly evaluated by sonography. Other: None. IMPRESSION: Nonobstructing left nephrolithiasis. Slightly increased right renal echogenicity, may reflect medical renal disease. Bladder decompressed by Foley catheter, poorly evaluated by sonography. Electronically Signed   By: Lovena Le M.D.   On: 04/12/2019 20:03  Dg Pelvis Portable  Result Date: 04/05/2019 CLINICAL DATA:  Fall. EXAM: PORTABLE PELVIS 1-2 VIEWS COMPARISON:  No prior. FINDINGS: Degenerative changes lumbar spine and both hips. No acute bony or joint abnormality identified. No evidence of fracture dislocation. Aortoiliac atherosclerotic vascular calcification. IMPRESSION: Degenerative change lumbar spine and both hips. No acute abnormality. Electronically Signed   By: [place]   On: 04/05/2019 16:23   Dg Chest Port 1 View  Result Date: 04/16/2019 CLINICAL DATA:  Aspiration. EXAM: PORTABLE CHEST 1 VIEW COMPARISON:  04/14/2019. FINDINGS: Endotracheal tube and NG tube noted in stable position. Heart size normal. Low lung volumes with bibasilar atelectasis. Left base infiltrate. No pleural effusion or pneumothorax. Carotid vascular calcification. IMPRESSION: 1.  Endotracheal tube and NG tube in stable position. 2. Low lung volumes with bibasilar atelectasis. Mild left base infiltrate consistent with pneumonia. 3.  Carotid vascular disease. Electronically Signed   By: Marcello Moores  Register   On: 04/16/2019 14:48   Dg Chest Port 1 View  Result Date: 04/14/2019 CLINICAL DATA:  Acute respiratory failure. EXAM: PORTABLE CHEST 1 VIEW COMPARISON:  04/13/2019 FINDINGS: Endotracheal tube has tip 4.1 cm above the carina. Enteric tube courses into the stomach and off the film as tip is not visualized. Lungs are adequately inflated and otherwise clear. Cardiomediastinal  silhouette and remainder of the exam is unchanged. IMPRESSION: No acute cardiopulmonary disease. Tubes and lines as described. Electronically Signed   By: Marin Olp M.D.   On: 04/14/2019 09:00   Dg Chest Port 1 View  Result Date: 04/13/2019 CLINICAL DATA:  ET tube placement EXAM: PORTABLE CHEST 1 VIEW COMPARISON:  Chest radiograph 04/13/2019 FINDINGS: Neural placement of an endotracheal tube with tip between the thoracic inlet and carina. Stable cardiomediastinal contours. There are scattered linear opacities likely reflecting atelectasis. No new focal infiltrate. No pneumothorax or large pleural effusion. IMPRESSION: The endotracheal tube tip projects between the thoracic inlet and carina. No new acute finding. Electronically Signed   By: Audie Pinto M.D.   On: 04/13/2019 18:43   Dg Chest Portable 1 View  Result Date: 04/05/2019 CLINICAL DATA:  Fall, weakness EXAM: PORTABLE CHEST 1 VIEW COMPARISON:  Radiograph 02/22/2019 FINDINGS: Basilar atelectasis. No consolidation, features of edema, pneumothorax, or effusion. Pulmonary vascularity is normally distributed. The cardiomediastinal contours are unremarkable. No acute osseous or soft tissue abnormality. IMPRESSION: Basilar atelectasis, otherwise no acute cardiopulmonary abnormality or traumatic findings in the chest. Electronically Signed   By: Lovena Le M.D.   On: 04/05/2019 16:24   Dg Abdomen Acute W/chest  Result Date: 04/12/2019 CLINICAL DATA:  Vomiting, diarrhea. EXAM: DG ABDOMEN ACUTE W/ 1V CHEST COMPARISON:  01/20/2016 FINDINGS: Lungs are clear. Heart size is normal. No free air beneath either right or left hemidiaphragm. Stool and gas throughout the colon. No signs of bowel obstruction. No acute bone process. IMPRESSION: No acute cardiopulmonary disease. No acute intra-abdominal finding. Electronically Signed   By: Zetta Bills M.D.   On: 04/12/2019 12:49       Subjective: denies any pain or discomfort  Discharge  Exam: Vitals:   04/18/19 2017 04/19/19 1246  BP: (!) 130/59 (!) 129/55  Pulse: 80 79  Resp: (!) 22 (!) 22  Temp: 98.2 F (36.8 C) 98.5 F (36.9 C)  SpO2: 98% 100%   Vitals:   04/18/19 1600 04/18/19 1700 04/18/19 2017 04/19/19 1246  BP:   (!) 130/59 (!) 129/55  Pulse: 79 79 80 79  Resp: 17 15 (!) 22 (!) 22  Temp:  98.2 F (36.8 C) 98.5 F (36.9 C)  TempSrc:   Oral Oral  SpO2: 98% 100% 98% 100%  Weight:      Height:       Fatigued, not in distress HEENT: moist mucosa  chest: diminished b/l  CVS: NS1&S2  GI: soft, NT, ND Musculoskeletal: warm, no edema     The results of significant diagnostics from this hospitalization (including imaging, microbiology, ancillary and laboratory) are listed below for reference.     Microbiology: Recent Results (from the past 240 hour(s))  Blood culture (routine x 2)     Status: None   Collection Time: 04/12/19 11:58 AM   Specimen: BLOOD  Result Value Ref Range Status   Specimen Description BLOOD RIGHT ANTECUBITAL  Final   Special Requests   Final    BOTTLES DRAWN AEROBIC AND ANAEROBIC Blood Culture adequate volume   Culture   Final    NO GROWTH 5 DAYS Performed at [facility], [address], [place], [place] [address]    Report Status 04/17/2019 FINAL  Final  SARS CORONAVIRUS 2 (TAT 6-24 HRS) Nasopharyngeal Nasopharyngeal Swab     Status: None   Collection Time: 04/12/19 11:58 AM   Specimen: Nasopharyngeal Swab  Result Value Ref Range Status   SARS Coronavirus 2 NEGATIVE NEGATIVE Final    Comment: (NOTE) SARS-CoV-2 target nucleic acids are NOT DETECTED. The SARS-CoV-2 RNA is generally detectable in upper and lower respiratory specimens during the acute phase of infection. Negative results do not preclude SARS-CoV-2 infection, do not rule out co-infections with other pathogens, and should not be used as the sole basis for treatment or other patient management decisions. Negative results must be combined with  clinical observations, patient history, and epidemiological information. The expected result is Negative. Fact Sheet for Patients: SugarRoll.be Fact Sheet for Healthcare Providers: https://www.woods-mathews.com/ This test is not yet approved or cleared by the [place] FDA and  has been authorized for detection and/or diagnosis of SARS-CoV-2 by FDA under an Emergency Use Authorization (EUA). This EUA will remain  in effect (meaning this test can be used) for the duration of the COVID-19 declaration under Section 56 4(b)(1) of the Act, 21 U.S.C. section 360bbb-3(b)(1), unless the authorization is terminated or revoked sooner. Performed at [facility], [address] [place], [place] [address]   Urine Culture     Status: Abnormal   Collection Time: 04/12/19  2:18 PM   Specimen: Urine, Catheterized  Result Value Ref Range Status   Specimen Description   Final    URINE, CATHETERIZED Performed at [facility], [address] [place], [place] [address]    Special Requests   Final    NONE Performed at [facility], [address] [place], [place] [address]    Culture >=100,000 COLONIES/mL ESCHERICHIA COLI (A)  Final   Report Status 04/14/2019 FINAL  Final   Organism ID, Bacteria ESCHERICHIA COLI (A)  Final      Susceptibility   Escherichia coli - MIC*    AMPICILLIN >=32 RESISTANT Resistant     CEFAZOLIN <=4 SENSITIVE Sensitive     CEFTRIAXONE <=1 SENSITIVE Sensitive     CIPROFLOXACIN <=0.25 SENSITIVE Sensitive     GENTAMICIN <=1 SENSITIVE Sensitive     IMIPENEM <=0.25 SENSITIVE Sensitive     NITROFURANTOIN <=16 SENSITIVE Sensitive     TRIMETH/SULFA <=20 SENSITIVE Sensitive     AMPICILLIN/SULBACTAM >=32 RESISTANT Resistant     PIP/TAZO <=4 SENSITIVE Sensitive  Extended ESBL NEGATIVE Sensitive     * >=100,000 COLONIES/mL ESCHERICHIA COLI  MRSA PCR Screening     Status: None   Collection Time: 04/13/19   5:59 AM   Specimen: Nasal Mucosa; Nasopharyngeal  Result Value Ref Range Status   MRSA by PCR NEGATIVE NEGATIVE Final    Comment:        The GeneXpert MRSA Assay (FDA approved for NASAL specimens only), is one component of a comprehensive MRSA colonization surveillance program. It is not intended to diagnose MRSA infection nor to guide or monitor treatment for MRSA infections. Performed at [facility], [address] [place], [place] [address]   Culture, respiratory (non-expectorated)     Status: None   Collection Time: 04/17/19 11:16 AM   Specimen: Tracheal Aspirate; Respiratory  Result Value Ref Range Status   Specimen Description   Final    TRACHEAL ASPIRATE Performed at [facility], [address] [place], [place] [address]    Special Requests   Final    Normal Performed at [facility], [address] [place], [place] [address]    Gram Stain   Final    MODERATE WBC PRESENT, PREDOMINANTLY PMN FEW GRAM POSITIVE DIPLOCOCCI Performed at [facility], [place] [address], [place], [place] [address]    Culture ABUNDANT ENTEROBACTER AEROGENES  Final   Report Status 04/19/2019 FINAL  Final   Organism ID, Bacteria ENTEROBACTER AEROGENES  Final      Susceptibility   Enterobacter aerogenes - MIC*    CEFAZOLIN >=64 RESISTANT Resistant     CEFEPIME <=1 SENSITIVE Sensitive     CEFTAZIDIME >=64 RESISTANT Resistant     CEFTRIAXONE >=64 RESISTANT Resistant     CIPROFLOXACIN <=0.25 SENSITIVE Sensitive     GENTAMICIN <=1 SENSITIVE Sensitive     IMIPENEM 1 SENSITIVE Sensitive     TRIMETH/SULFA <=20 SENSITIVE Sensitive     PIP/TAZO >=128 RESISTANT Resistant     * ABUNDANT ENTEROBACTER AEROGENES     Labs: BNP (last 3 results) Recent Labs    02/22/19 1526 04/13/19 0355  BNP 83.0 0000000*   Basic Metabolic Panel: Recent Labs  Lab 04/14/19 0525  04/15/19 0553  04/16/19 0551 04/16/19 1045 04/16/19 1454 04/17/19 0617 04/17/19 1058  04/18/19 0349  NA 138   < > 136   < > 137 137 137 138 137 138  K 3.2*   < > 3.6   < > 3.8 3.9 4.4 3.8 3.9 3.9  CL 100   < > 96*   < > 103 102 104 104 102 106  CO2 20*   < > 28   < > 28 27 26 27 25 22   GLUCOSE 196*   < > 205*   < > 268* 258* 198* 129* 149* 201*  BUN 54*   < > 47*   < > 25* 22 20 26* 28* 44*  CREATININE 4.13*   < > 3.63*   < > 2.09* 2.00* 1.82* 2.69* 2.95* 3.84*  CALCIUM 7.0*   < > 6.9*   < > 7.4* 7.4* 7.4* 7.9* 8.0* 8.0*  MG 1.8  --  2.1  --  2.3  --   --  2.2  --  2.2  PHOS 5.0*   < > 4.2   < > 1.5* 1.7* 2.2* 2.7 2.9 3.7   < > = values in this interval not displayed.   Liver Function Tests: Recent Labs  Lab 04/13/19 0355  04/13/19 1323  04/14/19 0525  04/16/19 1045 04/16/19 1454 04/17/19 0617 04/17/19 1058 04/18/19 0349  AST 89*  --  87*  --  159*  --   --   --   --   --   --   ALT 56*  --  68*  --  108*  --   --   --   --   --   --   ALKPHOS 60  --  59  --  54  --   --   --   --   --   --   BILITOT 0.7  --  1.0  --  0.7  --   --   --   --   --   --   PROT 6.1*  --  6.0*  --  5.5*  --   --   --   --   --   --   ALBUMIN 2.6*   < > 2.7*   < > 2.7*  2.8*   < > 2.7* 2.5* 2.5* 2.5* 2.3*   < > = values in this interval not displayed.   No results for input(s): LIPASE, AMYLASE in the last 168 hours. Recent Labs  Lab 04/13/19 1046  AMMONIA 269*   CBC: Recent Labs  Lab 04/13/19 0355  04/14/19 0134 04/15/19 0553 04/15/19 1814 04/16/19 0551 04/17/19 0617 04/18/19 0349  WBC 8.4   < > 13.6* 6.8  --  6.2 8.5 9.3  NEUTROABS 6.7  --  10.9* 5.2  --  4.9 7.4  --   HGB 9.3*   < > 7.8* 6.7* 8.0* 7.1* 7.9* 7.3*  HCT 30.6*   < > 24.1* 19.2* 24.4* 22.4* 24.5* 22.9*  MCV 91.3   < > 86.7 80.0  --  84.8 84.5 85.8  PLT 316   < > 234 161  --  141* 146* 208   < > = values in this interval not displayed.   Cardiac Enzymes: No results for input(s): CKTOTAL, CKMB, CKMBINDEX, TROPONINI in the last 168 hours. BNP: Invalid input(s): POCBNP CBG: Recent Labs  Lab  04/17/19 1616 04/17/19 1923 04/18/19 0052 04/18/19 0341 04/18/19 0741  GLUCAP 179* 216* 199* 190* 182*   D-Dimer No results for input(s): DDIMER in the last 72 hours. Hgb A1c No results for input(s): HGBA1C in the last 72 hours. Lipid Profile No results for input(s): CHOL, HDL, LDLCALC, TRIG, CHOLHDL, LDLDIRECT in the last 72 hours. Thyroid function studies No results for input(s): TSH, T4TOTAL, T3FREE, THYROIDAB in the last 72 hours.  Invalid input(s): FREET3 Anemia work up No results for input(s): VITAMINB12, FOLATE, FERRITIN, TIBC, IRON, RETICCTPCT in the last 72 hours. Urinalysis    Component Value Date/Time   COLORURINE YELLOW (A) 04/12/2019 1418   APPEARANCEUR TURBID (A) 04/12/2019 1418   LABSPEC 1.011 04/12/2019 1418   PHURINE 6.0 04/12/2019 1418   GLUCOSEU NEGATIVE 04/12/2019 1418   HGBUR MODERATE (A) 04/12/2019 1418   BILIRUBINUR NEGATIVE 04/12/2019 1418   KETONESUR NEGATIVE 04/12/2019 1418   PROTEINUR 100 (A) 04/12/2019 1418   NITRITE NEGATIVE 04/12/2019 1418   LEUKOCYTESUR MODERATE (A) 04/12/2019 1418   Sepsis Labs Invalid input(s): PROCALCITONIN,  WBC,  LACTICIDVEN Microbiology Recent Results (from the past 240 hour(s))  Blood culture (routine x 2)     Status: None   Collection Time: 04/12/19 11:58 AM   Specimen: BLOOD  Result Value Ref Range Status   Specimen Description BLOOD RIGHT ANTECUBITAL  Final   Special Requests  Final    BOTTLES DRAWN AEROBIC AND ANAEROBIC Blood Culture adequate volume   Culture   Final    NO GROWTH 5 DAYS Performed at [facility], [address] [place], [place] [address]    Report Status 04/17/2019 FINAL  Final  SARS CORONAVIRUS 2 (TAT 6-24 HRS) Nasopharyngeal Nasopharyngeal Swab     Status: None   Collection Time: 04/12/19 11:58 AM   Specimen: Nasopharyngeal Swab  Result Value Ref Range Status   SARS Coronavirus 2 NEGATIVE NEGATIVE Final    Comment: (NOTE) SARS-CoV-2 target nucleic acids are NOT  DETECTED. The SARS-CoV-2 RNA is generally detectable in upper and lower respiratory specimens during the acute phase of infection. Negative results do not preclude SARS-CoV-2 infection, do not rule out co-infections with other pathogens, and should not be used as the sole basis for treatment or other patient management decisions. Negative results must be combined with clinical observations, patient history, and epidemiological information. The expected result is Negative. Fact Sheet for Patients: SugarRoll.be Fact Sheet for Healthcare Providers: https://www.woods-mathews.com/ This test is not yet approved or cleared by the [place] FDA and  has been authorized for detection and/or diagnosis of SARS-CoV-2 by FDA under an Emergency Use Authorization (EUA). This EUA will remain  in effect (meaning this test can be used) for the duration of the COVID-19 declaration under Section 56 4(b)(1) of the Act, 21 U.S.C. section 360bbb-3(b)(1), unless the authorization is terminated or revoked sooner. Performed at [facility], [place] [address], [place], [place] [address]   Urine Culture     Status: Abnormal   Collection Time: 04/12/19  2:18 PM   Specimen: Urine, Catheterized  Result Value Ref Range Status   Specimen Description   Final    URINE, CATHETERIZED Performed at [facility], [address] [place], [place] [address]    Special Requests   Final    NONE Performed at [facility], [address] [place], [place] [address]    Culture >=100,000 COLONIES/mL ESCHERICHIA COLI (A)  Final   Report Status 04/14/2019 FINAL  Final   Organism ID, Bacteria ESCHERICHIA COLI (A)  Final      Susceptibility   Escherichia coli - MIC*    AMPICILLIN >=32 RESISTANT Resistant     CEFAZOLIN <=4 SENSITIVE Sensitive     CEFTRIAXONE <=1 SENSITIVE Sensitive     CIPROFLOXACIN <=0.25 SENSITIVE Sensitive     GENTAMICIN <=1 SENSITIVE  Sensitive     IMIPENEM <=0.25 SENSITIVE Sensitive     NITROFURANTOIN <=16 SENSITIVE Sensitive     TRIMETH/SULFA <=20 SENSITIVE Sensitive     AMPICILLIN/SULBACTAM >=32 RESISTANT Resistant     PIP/TAZO <=4 SENSITIVE Sensitive     Extended ESBL NEGATIVE Sensitive     * >=100,000 COLONIES/mL ESCHERICHIA COLI  MRSA PCR Screening     Status: None   Collection Time: 04/13/19  5:59 AM   Specimen: Nasal Mucosa; Nasopharyngeal  Result Value Ref Range Status   MRSA by PCR NEGATIVE NEGATIVE Final    Comment:        The GeneXpert MRSA Assay (FDA approved for NASAL specimens only), is one component of a comprehensive MRSA colonization surveillance program. It is not intended to diagnose MRSA infection nor to guide or monitor treatment for MRSA infections. Performed at [facility], [address] [place], [place] [address]   Culture, respiratory (non-expectorated)     Status: None   Collection Time: 04/17/19 11:16 AM   Specimen: Tracheal Aspirate;  Respiratory  Result Value Ref Range Status   Specimen Description   Final    TRACHEAL ASPIRATE Performed at [facility], [address] [place], [place] [address]    Special Requests   Final    Normal Performed at [facility], [address] [place], [place] [address]    Gram Stain   Final    MODERATE WBC PRESENT, PREDOMINANTLY PMN FEW GRAM POSITIVE DIPLOCOCCI Performed at [facility], [place] [address], [place], [place] [address]    Culture ABUNDANT ENTEROBACTER AEROGENES  Final   Report Status 04/19/2019 FINAL  Final   Organism ID, Bacteria ENTEROBACTER AEROGENES  Final      Susceptibility   Enterobacter aerogenes - MIC*    CEFAZOLIN >=64 RESISTANT Resistant     CEFEPIME <=1 SENSITIVE Sensitive     CEFTAZIDIME >=64 RESISTANT Resistant     CEFTRIAXONE >=64 RESISTANT Resistant     CIPROFLOXACIN <=0.25 SENSITIVE Sensitive     GENTAMICIN <=1 SENSITIVE Sensitive     IMIPENEM 1 SENSITIVE Sensitive      TRIMETH/SULFA <=20 SENSITIVE Sensitive     PIP/TAZO >=128 RESISTANT Resistant     * ABUNDANT ENTEROBACTER AEROGENES     Time coordinating discharge:25 minutes  SIGNED:   Louellen Molder, MD  [facility] 04/19/2019, 2:28 PM Pager   If 7PM-7AM, please contact night-coverage [email or web address] Password [email or web address]

## 2019-04-19 NOTE — Progress Notes (Signed)
Patient alert to self EMS arrived transported patient to hospice with family following

## 2019-04-19 NOTE — Progress Notes (Addendum)
Visit made to new referral for [facility] hospice home. Writer met in the room with patient's niece Havier Deeb and grand niece Chesley Mires to initiate education regarding hospice services, philosophy, team approach to care and current visitation policy with understanding voiced. They are in agreement with transfer to the hospice home this evening. Patient will require EMS transport and signed out facility DNR in place. Hospital care team updated. Report called to the hospice home. Writer to arrange transport for 7:30  pm EMS pickup. Flo Shanks BSN, RN, [place] [phone]

## 2019-04-19 NOTE — TOC Transition Note (Signed)
Transition of Care [facility]) - CM/SW Discharge Note   Patient Details  Name: Philip Garrett MRN: [id number] Date of Birth: July 05, 1931  Transition of Care [facility]) CM/SW Contact:  Annamaria Boots, [place] Phone Number: 04/19/2019, 1:19 PM   Clinical Narrative:   Patient is medically stable for transport to hospice home today. Family is aware and in agreement. Patient will be transported by EMS. Santiago Glad, hospice liaison to call for transport.     Final next level of care: [place] Barriers to Discharge: No Barriers Identified   Patient Goals and CMS Choice   CMS Medicare.gov Compare Post Acute Care list provided to:: Patient Represenative (must comment)(niece) Choice offered to / list presented to : [place] / Guardian  Discharge Placement                Patient to be transferred to facility by: EMS   Patient and family notified of of transfer: 04/19/19  Discharge Plan and Services                                     Social Determinants of Health (SDOH) Interventions     Readmission Risk Interventions No flowsheet data found.

## 2019-04-19 NOTE — Discharge Summary (Addendum)
Physician Discharge Summary  Patient ID: Philip Garrett MRN: [id number] DOB/AGE: [birth year] 83 y.o.  Admit date: 04/12/2019 Discharge date: TBD  This is an interim discharge summary for the purpose of transitioning patient back to hospitalist service.  Final discharge summary to follow at the time of discharge.  Admission Diagnoses: Acute renal failure Severe metabolic acidosis (lactic)   Discharge Diagnoses:  Principal Problem:   Acute renal failure (ARF) (HCC) Active Problems:   Essential hypertension   Type 2 diabetes mellitus with other specified complication (HCC)   Chronic diastolic CHF (congestive heart failure) (HCC)   Coronary artery disease due to lipid rich plaque   Hyponatremia   Hyperkalemia   Metabolic acidosis   Transaminitis   Pressure injury of buttock, stage 1   Septic shock (HCC)   Acute respiratory failure with hypoxia (HCC)   Lactic acidosis Severe sepsis E. coli urinary tract infection Enterobacter aerogenes pneumonia Severe volume depletion Moderate protein calorie malnutrition Dementia, suspect vascular  Discharged Condition: poor, hospice  Hospital Course:  85 year old African-American male admitted from [facility] with nausea and vomiting x1 week, diarrhea, acute change in mental status, generalized weakness, hypotension and fever.  Found to be in acute renal failure, with severe sepsis and septic shock from urinary tract source.  Patient decompensated on the morning of November 7 requiring emergency respiratory and blood pressure support.  He had refractory shock, severe acute renal failure and hyperkalemia and severe lactic acidosis.  Emergently transferred to the ICU.  The patient was DNR/DNI however discussions with the POA were performed by Dr. Lanney Gins who was the [facility] attending that day.  The decision was to continue DNR status however to attempt intubation and CRRT to see if potentially reversible causes of decompensation could be done.   Patient was intubated and underwent CRRT starting on 7 November, however he remained critically ill throughout his stay in the ICU.  Due to dementia this limited painful interaction from the patient for successful extubation.  After discussion with nephrology, it was also noted that the patient would likely not have reversible renal failure and would be dialysis dependent for life.  At this point Palliative Care was then consulted.  Positive care saw the patient on 11 November and subsequently had a family meeting with the patient's family on 12 November.  At this point it was decided to extubate the patient with the intention of transitioning him to comfort care and to place him in a hospice facility.  The patient's family had noted that the patient had significant decline acutely over the last year and worse over the last month.  He had been in a skilled nursing facility for many years.  The patient was extubated on 12 November, transition to comfort care and transferred to the general medical ward.  Arrangements are being made for transfer to hospice.  Consults:  Nephrology Palliative Care    Significant Diagnostic Studies: labs: See the full laboratory data obtained from day of admission , microbiology: sputum culture: positive for Enterobacter aerogenes and urine culture: positive for E.coli and radiology: CT scan: Abdomen and pelvis showing nonobstructive nephrolithiasis and abnormally thickened urinary bladder suggestive of chronic outlet obstruction or cystitis  Treatments: IV hydration, antibiotics: ceftriaxone, procedures: Intubation, mechanical ventilation, placement of right femoral dialysis catheter and dialysis: Hemodialysis (CRRT)  Discharge Exam: Blood pressure (!) 130/59, pulse 80, temperature 98.2 F (36.8 C), temperature source Oral, resp. rate (!) 22, height 6\' 4"  (1.93 m), weight 113.8 kg, SpO2 98 %. GENERAL:Unresponsive  to verbal stimuli, does not track, occasional wet  sounding cough HEAD: Normocephalic, atraumatic.  EYES: Pupils equal, round,nonreactive to light. No scleral icterus.  MOUTH:Edentulous, oral mucosa moist.  No thrush NECK: Supple.Trachea midline. PULMONARY:Coarse breath sounds bilaterally, rhonchorous in upper airway CARDIOVASCULAR: S1 and S2. Regular rate and rhythm. No murmurs, rubs, or gallops.  GASTROINTESTINAL: Soft, non-distended. No masses. Positive bowel sounds.  MUSCULOSKELETAL:Edematous extremities(anasarca 2+).Missing first two toes on left foot. NEUROLOGIC:Unresponsive to verbal stimuli.  Non-purposeful movement of extremities. SKIN:intact,warm,dry  Disposition:  Hospice for ongoing comfort care  Medications: To be determined at time of actual discharge  Signed: C. Derrill Kay, MD [facility] 04/19/2019, 12:31 PM    *This note was dictated using voice recognition software/Dragon.  Despite best efforts to proofread, errors can occur which can change the meaning.  Any change was purely unintentional.

## 2019-04-19 NOTE — Progress Notes (Signed)
Patient discharged to EMS along with family. Instructions and paper work given to EMS along with patient belongings. Patient being transported to hospice house

## 2019-05-07 DEATH — deceased
